# Patient Record
Sex: Male | Born: 1955 | Race: White | Hispanic: No | Marital: Married | Smoking: Former smoker
Health system: Southern US, Community
[De-identification: ages and names within clinical notes are randomized; demographics above are authoritative.]

## PROBLEM LIST (undated history)

## (undated) DIAGNOSIS — K5792 Diverticulitis of intestine, part unspecified, without perforation or abscess without bleeding: Secondary | ICD-10-CM

## (undated) DIAGNOSIS — T8182XA Emphysema (subcutaneous) resulting from a procedure, initial encounter: Secondary | ICD-10-CM

## (undated) DIAGNOSIS — K7460 Unspecified cirrhosis of liver: Secondary | ICD-10-CM

## (undated) DIAGNOSIS — A0472 Enterocolitis due to Clostridium difficile, not specified as recurrent: Secondary | ICD-10-CM

## (undated) DIAGNOSIS — C419 Malignant neoplasm of bone and articular cartilage, unspecified: Secondary | ICD-10-CM

## (undated) DIAGNOSIS — N10 Acute tubulo-interstitial nephritis: Secondary | ICD-10-CM

## (undated) DIAGNOSIS — R161 Splenomegaly, not elsewhere classified: Secondary | ICD-10-CM

## (undated) DIAGNOSIS — R310 Gross hematuria: Secondary | ICD-10-CM

## (undated) DIAGNOSIS — C679 Malignant neoplasm of bladder, unspecified: Secondary | ICD-10-CM

## (undated) DIAGNOSIS — C61 Malignant neoplasm of prostate: Secondary | ICD-10-CM

## (undated) HISTORY — DX: Malignant neoplasm of prostate: C61

## (undated) HISTORY — PX: BACK SURGERY: SHX140

## (undated) HISTORY — PX: ELBOW SURGERY: SHX618

---

## 2000-10-17 ENCOUNTER — Emergency Department (HOSPITAL_COMMUNITY): Admission: EM | Admit: 2000-10-17 | Discharge: 2000-10-17 | Payer: Self-pay | Admitting: Emergency Medicine

## 2000-10-17 ENCOUNTER — Encounter: Payer: Self-pay | Admitting: Emergency Medicine

## 2001-01-12 ENCOUNTER — Encounter: Payer: Self-pay | Admitting: Internal Medicine

## 2001-01-12 ENCOUNTER — Ambulatory Visit (HOSPITAL_COMMUNITY): Admission: RE | Admit: 2001-01-12 | Discharge: 2001-01-12 | Payer: Self-pay | Admitting: Internal Medicine

## 2001-02-24 ENCOUNTER — Ambulatory Visit (HOSPITAL_COMMUNITY): Admission: RE | Admit: 2001-02-24 | Discharge: 2001-02-24 | Payer: Self-pay | Admitting: Internal Medicine

## 2001-02-24 ENCOUNTER — Encounter: Payer: Self-pay | Admitting: Internal Medicine

## 2002-11-09 ENCOUNTER — Encounter: Payer: Self-pay | Admitting: Family Medicine

## 2002-11-09 ENCOUNTER — Ambulatory Visit (HOSPITAL_COMMUNITY): Admission: RE | Admit: 2002-11-09 | Discharge: 2002-11-09 | Payer: Self-pay | Admitting: Family Medicine

## 2003-03-14 ENCOUNTER — Emergency Department (HOSPITAL_COMMUNITY): Admission: EM | Admit: 2003-03-14 | Discharge: 2003-03-14 | Payer: Self-pay | Admitting: Emergency Medicine

## 2003-04-02 ENCOUNTER — Ambulatory Visit (HOSPITAL_COMMUNITY): Admission: RE | Admit: 2003-04-02 | Discharge: 2003-04-02 | Payer: Self-pay | Admitting: Orthopedic Surgery

## 2004-04-03 ENCOUNTER — Emergency Department: Payer: Self-pay | Admitting: Emergency Medicine

## 2005-01-30 ENCOUNTER — Emergency Department: Payer: Self-pay | Admitting: Emergency Medicine

## 2005-07-26 ENCOUNTER — Ambulatory Visit (HOSPITAL_COMMUNITY): Admission: RE | Admit: 2005-07-26 | Discharge: 2005-07-26 | Payer: Self-pay | Admitting: Internal Medicine

## 2005-08-10 ENCOUNTER — Inpatient Hospital Stay (HOSPITAL_COMMUNITY): Admission: EM | Admit: 2005-08-10 | Discharge: 2005-08-12 | Payer: Self-pay | Admitting: Emergency Medicine

## 2005-11-04 ENCOUNTER — Emergency Department (HOSPITAL_COMMUNITY): Admission: EM | Admit: 2005-11-04 | Discharge: 2005-11-04 | Payer: Self-pay | Admitting: Family Medicine

## 2005-11-28 ENCOUNTER — Other Ambulatory Visit: Payer: Self-pay

## 2005-11-28 ENCOUNTER — Emergency Department: Payer: Self-pay | Admitting: General Practice

## 2007-03-12 ENCOUNTER — Emergency Department (HOSPITAL_COMMUNITY): Admission: EM | Admit: 2007-03-12 | Discharge: 2007-03-12 | Payer: Self-pay | Admitting: Emergency Medicine

## 2007-03-13 ENCOUNTER — Encounter (HOSPITAL_COMMUNITY): Admission: RE | Admit: 2007-03-13 | Discharge: 2007-03-15 | Payer: Self-pay | Admitting: Emergency Medicine

## 2007-03-13 ENCOUNTER — Ambulatory Visit (HOSPITAL_COMMUNITY): Payer: Self-pay | Admitting: Emergency Medicine

## 2009-12-31 ENCOUNTER — Emergency Department (HOSPITAL_COMMUNITY): Admission: EM | Admit: 2009-12-31 | Discharge: 2009-12-31 | Payer: Self-pay | Admitting: Emergency Medicine

## 2010-05-27 LAB — CBC
HCT: 48.3 % (ref 39.0–52.0)
Hemoglobin: 16.3 g/dL (ref 13.0–17.0)
MCH: 28.6 pg (ref 26.0–34.0)
Platelets: 228 10*3/uL (ref 150–400)
RDW: 14.3 % (ref 11.5–15.5)
WBC: 10.2 10*3/uL (ref 4.0–10.5)

## 2010-05-27 LAB — URINE CULTURE

## 2010-05-27 LAB — DIFFERENTIAL
Basophils Relative: 1 % (ref 0–1)
Eosinophils Absolute: 0.2 10*3/uL (ref 0.0–0.7)
Lymphocytes Relative: 28 % (ref 12–46)
Monocytes Absolute: 0.8 10*3/uL (ref 0.1–1.0)
Monocytes Relative: 7 % (ref 3–12)
Neutro Abs: 6.4 10*3/uL (ref 1.7–7.7)

## 2010-05-27 LAB — URINALYSIS, ROUTINE W REFLEX MICROSCOPIC
Bilirubin Urine: NEGATIVE
Glucose, UA: NEGATIVE mg/dL
Ketones, ur: NEGATIVE mg/dL
Leukocytes, UA: NEGATIVE
Nitrite: NEGATIVE
Specific Gravity, Urine: >1.03 — ABNORMAL HIGH (ref 1.005–1.030)
pH: 5.5 (ref 5.0–8.0)

## 2010-05-27 LAB — BASIC METABOLIC PANEL
BUN: 21 mg/dL (ref 6–23)
Calcium: 8.9 mg/dL (ref 8.4–10.5)
Chloride: 105 mEq/L (ref 96–112)
GFR calc Af Amer: >60 mL/min (ref >60)
Glucose, Bld: 106 mg/dL — ABNORMAL HIGH (ref 70–99)

## 2010-05-27 LAB — URINE MICROSCOPIC-ADD ON

## 2010-07-31 NOTE — H&P (Signed)
NAME:  [name], [name] NO.:  [id number]   MEDICAL RECORD NO.:  [id number]          PATIENT TYPE:  INP   LOCATION:  A306                          FACILITY:  [facility]   PHYSICIAN:  Kirk Ruths, M.D.DATE OF BIRTH:  [birth year]   DATE OF ADMISSION:  08/10/2005  DATE OF DISCHARGE:  LH                                HISTORY & PHYSICAL   CHIEF COMPLAINT:  Abdominal pain.   HISTORY OF PRESENT ILLNESS:  This is a 55 year old male who presents to the  emergency room with a two-day history of fever and left lower quadrant pain.  The patient workup in the ER showed a white count of 14,000 and CT scan  consistent with acute diverticulitis of the descending colon without  abscess. The patient is admitted for IV antibiotics, control of this  infection. He denies nausea, vomiting, diarrhea, constipation and he does  state he has had some diaphoreses.   PAST MEDICAL HISTORY:  He is allergic to no medications and he currently  takes only occasional arthritis medication for aches and pain.   SOCIAL HISTORY:  He works in [employer], nonsmoker, nondrinker.   PHYSICAL EXAMINATION:  VITAL SIGNS:  Temperature 98, blood pressure 120/70,  heart rate 80, respirations 20, O2 sats 96% on room air.  HEENT:  TMs normal. Pupils equal round and reactive to light and  accommodation. Oropharynx benign.  NECK:  Supple without JVD, bruit or thyromegaly.  LUNGS:  Clear in all areas.  HEART:  Regular sinus rhythm without murmur, gallop or rub.  ABDOMEN:  Positive bowel sounds with mild to moderate left lower quadrant  tenderness, no rebound.  EXTREMITIES:  Without cyanosis, clubbing or edema.  NEUROLOGIC:  Grossly intact.   ASSESSMENT:  Acute diverticulitis.   PLAN:  Cipro and Flagyl. Observe closely.      Kirk Ruths, M.D.  Electronically Signed     WMM/MEDQ  D:  08/11/2005  T:  08/11/2005  Job:  [id number]

## 2010-07-31 NOTE — Discharge Summary (Signed)
NAME:  [name], [name] NO.:  [id number]   MEDICAL RECORD NO.:  [id number]          PATIENT TYPE:  INP   LOCATION:  A306                          FACILITY:  [facility]   PHYSICIAN:  Kirk Ruths, M.D.DATE OF BIRTH:  1955-11-16   DATE OF ADMISSION:  08/10/2005  DATE OF DISCHARGE:  05/31/2007LH                                 DISCHARGE SUMMARY   DISCHARGE DIAGNOSIS:  Acute diverticulitis.   HOSPITAL COURSE:  This 55 year old male presented to the emergency room with  a 2-day history of fever and pain in his left lower quadrant.  CT scan  showed acute diverticulitis of the descending colon without abscess.  Initial white count was 14,000 on admission.  The patient was admitted for  IV antibiotics for control of his diverticulitis.  He remained afebrile  throughout his stay.  Blood pressure was well controlled.  His white count  the second hospital day was 13.6, hemoglobin was 16.  The patient's  electrolytes were within normal range throughout his stay, as well as his  liver function tests.  Lipase was normal at 18.  Urinalysis was  unremarkable.  The patient's pain is diminished, requiring no pain  medications at the time of discharge, no nausea or vomiting.  He will be  discharged home on Cipro 500 twice a day for a week; and Flagyl 500, 3 times  a day for a week, having received these two medications through the IV  during his stay.  He should be able to return to work on Monday August 16, 2005.  He will be followed in the office in 2 weeks as needed.      Kirk Ruths, M.D.  Electronically Signed     WMM/MEDQ  D:  08/12/2005  T:  08/12/2005  Job:  [id number]

## 2010-12-18 LAB — CULTURE, BODY FLUID W GRAM STAIN -BOTTLE: Report Status: 1022009

## 2011-07-02 ENCOUNTER — Emergency Department (HOSPITAL_COMMUNITY): Payer: Medicaid Other

## 2011-07-02 ENCOUNTER — Encounter (HOSPITAL_COMMUNITY): Payer: Self-pay | Admitting: Emergency Medicine

## 2011-07-02 ENCOUNTER — Inpatient Hospital Stay (HOSPITAL_COMMUNITY)
Admission: EM | Admit: 2011-07-02 | Discharge: 2011-07-04 | DRG: 373 | Disposition: A | Discharge status: Home / Self Care | Payer: Medicaid Other | Attending: Internal Medicine | Admitting: Internal Medicine

## 2011-07-02 DIAGNOSIS — M25519 Pain in unspecified shoulder: Secondary | ICD-10-CM | POA: Diagnosis present

## 2011-07-02 DIAGNOSIS — E860 Dehydration: Secondary | ICD-10-CM | POA: Diagnosis present

## 2011-07-02 DIAGNOSIS — E669 Obesity, unspecified: Secondary | ICD-10-CM | POA: Diagnosis present

## 2011-07-02 DIAGNOSIS — K529 Noninfective gastroenteritis and colitis, unspecified: Secondary | ICD-10-CM | POA: Diagnosis present

## 2011-07-02 DIAGNOSIS — R310 Gross hematuria: Secondary | ICD-10-CM | POA: Diagnosis present

## 2011-07-02 DIAGNOSIS — H9190 Unspecified hearing loss, unspecified ear: Secondary | ICD-10-CM | POA: Diagnosis present

## 2011-07-02 DIAGNOSIS — D72829 Elevated white blood cell count, unspecified: Secondary | ICD-10-CM | POA: Diagnosis present

## 2011-07-02 DIAGNOSIS — A0472 Enterocolitis due to Clostridium difficile, not specified as recurrent: Principal | ICD-10-CM | POA: Diagnosis present

## 2011-07-02 DIAGNOSIS — Z6830 Body mass index (BMI) 30.0-30.9, adult: Secondary | ICD-10-CM

## 2011-07-02 DIAGNOSIS — R319 Hematuria, unspecified: Secondary | ICD-10-CM | POA: Diagnosis present

## 2011-07-02 DIAGNOSIS — M21939 Unspecified acquired deformity of unspecified forearm: Secondary | ICD-10-CM | POA: Diagnosis present

## 2011-07-02 DIAGNOSIS — Z87891 Personal history of nicotine dependence: Secondary | ICD-10-CM

## 2011-07-02 HISTORY — DX: Gross hematuria: R31.0

## 2011-07-02 HISTORY — DX: Enterocolitis due to Clostridium difficile, not specified as recurrent: A04.72

## 2011-07-02 LAB — URINALYSIS, ROUTINE W REFLEX MICROSCOPIC
Glucose, UA: NEGATIVE mg/dL
Ketones, ur: NEGATIVE mg/dL
Leukocytes, UA: NEGATIVE
Nitrite: NEGATIVE
Urobilinogen, UA: 0.2 mg/dL (ref 0.0–1.0)

## 2011-07-02 LAB — PROTIME-INR: INR: 1.06 (ref 0.00–1.49)

## 2011-07-02 LAB — DIFFERENTIAL
Basophils Relative: 0 % (ref 0–1)
Lymphocytes Relative: 14 % (ref 12–46)
Neutro Abs: 11.9 10*3/uL — ABNORMAL HIGH (ref 1.7–7.7)
Neutrophils Relative %: 78 % — ABNORMAL HIGH (ref 43–77)

## 2011-07-02 LAB — URINE MICROSCOPIC-ADD ON

## 2011-07-02 LAB — COMPREHENSIVE METABOLIC PANEL
ALT: 79 U/L — ABNORMAL HIGH (ref 0–53)
BUN: 16 mg/dL (ref 6–23)
CO2: 26 mEq/L (ref 19–32)
Calcium: 9.6 mg/dL (ref 8.4–10.5)
Creatinine, Ser: 1.12 mg/dL (ref 0.50–1.35)
GFR calc Af Amer: 83 mL/min — ABNORMAL LOW (ref >90)
GFR calc non Af Amer: 72 mL/min — ABNORMAL LOW (ref >90)
Total Bilirubin: 1 mg/dL (ref 0.3–1.2)

## 2011-07-02 LAB — APTT: aPTT: 31 seconds (ref 24–37)

## 2011-07-02 LAB — CBC
RBC: 6.5 MIL/uL — ABNORMAL HIGH (ref 4.22–5.81)
RDW: 14.6 % (ref 11.5–15.5)

## 2011-07-02 LAB — LIPASE, BLOOD: Lipase: 19 U/L (ref 11–59)

## 2011-07-02 MED ORDER — SODIUM CHLORIDE 0.9 % IV SOLN
INTRAVENOUS | Status: DC
  Administered 2011-07-03: 08:00:00 via INTRAVENOUS

## 2011-07-02 MED ORDER — ACETAMINOPHEN 325 MG PO TABS: 650.0000 mg | ORAL_TABLET | Freq: Four times a day (QID) | ORAL | Status: DC | PRN

## 2011-07-02 MED ORDER — SODIUM CHLORIDE 0.9 % IV BOLUS (SEPSIS)
1000.0000 mL | Freq: Once | INTRAVENOUS | Status: AC
  Administered 2011-07-02: 1000 mL via INTRAVENOUS

## 2011-07-02 MED ORDER — ONDANSETRON HCL 4 MG/2ML IJ SOLN: 4.0000 mg | INTRAMUSCULAR | Status: DC | PRN

## 2011-07-02 MED ORDER — PANTOPRAZOLE SODIUM 40 MG PO TBEC
40.0000 mg | DELAYED_RELEASE_TABLET | Freq: Every day | ORAL | Status: DC
  Administered 2011-07-02 – 2011-07-04 (×4): 40 mg via ORAL
  Filled 2011-07-02 (×3): qty 1

## 2011-07-02 MED ORDER — HYDROMORPHONE HCL PF 1 MG/ML IJ SOLN
1.0000 mg | Freq: Once | INTRAMUSCULAR | Status: AC
  Administered 2011-07-02: 1 mg via INTRAVENOUS
  Filled 2011-07-02: qty 1

## 2011-07-02 MED ORDER — CIPROFLOXACIN IN D5W 400 MG/200ML IV SOLN
INTRAVENOUS | Status: AC
  Filled 2011-07-02: qty 200

## 2011-07-02 MED ORDER — TRAZODONE HCL 50 MG PO TABS: 25.0000 mg | ORAL_TABLET | Freq: Every evening | ORAL | Status: DC | PRN

## 2011-07-02 MED ORDER — CIPROFLOXACIN IN D5W 400 MG/200ML IV SOLN
400.0000 mg | Freq: Two times a day (BID) | INTRAVENOUS | Status: DC
  Administered 2011-07-02 – 2011-07-03 (×4): 400 mg via INTRAVENOUS
  Filled 2011-07-02 (×5): qty 200

## 2011-07-02 MED ORDER — ONDANSETRON HCL 4 MG/2ML IJ SOLN
4.0000 mg | Freq: Once | INTRAMUSCULAR | Status: AC
  Administered 2011-07-02: 4 mg via INTRAVENOUS
  Filled 2011-07-02: qty 2

## 2011-07-02 MED ORDER — ONDANSETRON HCL 4 MG PO TABS: 4.0000 mg | ORAL_TABLET | Freq: Four times a day (QID) | ORAL | Status: DC | PRN

## 2011-07-02 MED ORDER — IOHEXOL 300 MG/ML  SOLN
40.0000 mL | Freq: Once | INTRAMUSCULAR | Status: AC | PRN
  Administered 2011-07-02: 40 mL via ORAL

## 2011-07-02 MED ORDER — METRONIDAZOLE 500 MG PO TABS
500.0000 mg | ORAL_TABLET | Freq: Three times a day (TID) | ORAL | Status: DC
  Administered 2011-07-02 – 2011-07-04 (×6): 500 mg via ORAL
  Filled 2011-07-02 (×7): qty 1

## 2011-07-02 MED ORDER — IOHEXOL 300 MG/ML  SOLN
100.0000 mL | Freq: Once | INTRAMUSCULAR | Status: AC | PRN
  Administered 2011-07-02: 100 mL via INTRAVENOUS

## 2011-07-02 MED ORDER — ACETAMINOPHEN 650 MG RE SUPP: 650.0000 mg | Freq: Four times a day (QID) | RECTAL | Status: DC | PRN

## 2011-07-02 MED ORDER — HYDROMORPHONE HCL PF 1 MG/ML IJ SOLN
1.0000 mg | INTRAMUSCULAR | Status: DC | PRN
  Administered 2011-07-02 – 2011-07-03 (×5): 1 mg via INTRAVENOUS
  Filled 2011-07-02 (×6): qty 1

## 2011-07-02 MED ORDER — POTASSIUM CHLORIDE IN NACL 20-0.9 MEQ/L-% IV SOLN
INTRAVENOUS | Status: DC
  Administered 2011-07-02 – 2011-07-03 (×2): via INTRAVENOUS

## 2011-07-02 NOTE — H&P (Signed)
18 stools watery brown since abd pain started yesterday. No blood hematuria x 3 months PCP:   [facility]  Chief Complaint:  Abdominal pain and diarrhea since yesterday  HPI: [name] is an 56 y.o. male.  Denies chronic medical problems; takes no chronic medication; had sudden onset of colicky generalized abdominal pain since midday yesterday; associated with diffuse watery brown diarrhea; about 18 stools since midday yesterday; no blood or melena. Some nausea but no vomiting. No family history of gastrointestinal problem.  Has been having episodic gross hematuria for the past 3 months; associated with episodic dysuria ;treated with a course of sulfa antibiotics without significant improvement.  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  Quit smoking 4 years ago, but smoked for 35 years before that, half to one pack per day. Denies alcohol use.  Extremely hard of hearing  Complains of thirst Rewiew of Systems:  The patient denies , fever, weight loss,, vision loss,, hoarseness, chest pain, syncope, dyspnea on exertion, peripheral edema, balance deficits, hemoptysis, melena, hematochezia, severe indigestion/heartburn,  incontinence, genital sores, muscle weakness, suspicious skin lesions, transient blindness, difficulty walking, depression, unusual weight change, enlarged lymph nodes, angioedema, and breast masses.   Past Medical History  Diagnosis Date  . Gross hematuria     Past Surgical History  Procedure Date  . Elbow surgery     Medications:  HOME MEDS: Prior to Admission medications   Medication Sig Start Date End Date Taking? Authorizing Provider  bismuth subsalicylate (PEPTO BISMOL) 262 MG/15ML suspension Take 30 mLs by mouth 2 (two) times daily as needed. For upset stomach   Yes Historical Provider, MD     Allergies:  No  Known Allergies  Social History:   reports that he quit smoking about 3 years ago. He does not have any smokeless tobacco history on file. He reports that he does not drink alcohol or use illicit drugs.  Family History: Family History  Problem Relation Age of Onset  . Heart failure Father   . Hypertension Mother      Physical Exam: Filed Vitals:   07/02/11 1822 07/02/11 1825  BP:  142/86  Pulse:  74  Temp:  97.4 F (36.3 C)  TempSrc:  Oral  Resp:  20  Height: 6\' 2"  (1.88 m)   Weight: 105.688 kg (233 lb)   SpO2:  95%   Blood pressure 142/86, pulse 74, temperature 97.4 F (36.3 C), temperature source Oral, resp. rate 20, height 6\' 2"  (1.88 m), weight 105.688 kg (233 lb), SpO2 95.00%.  GEN:  Pleasant mildly obese Caucasian gentleman lying in the stretcher; looks uncomfortable; cooperative with exam PSYCH:  alert and oriented x4;  affect is appropriate. HEENT: Mucous membranes pink, dry and anicteric; PERRLA; EOM intact; no cervical lymphadenopathy nor thyromegaly or carotid bruit; no JVD; Breasts:: Not examined CHEST WALL: No tenderness CHEST: Normal respiration, occasional rhonchi HEART: Regular rate and rhythm; no murmurs rubs or gallops BACK:; no CVA tenderness ABDOMEN: Obese, soft non-tender; no masses, no organomegaly, normal abdominal bowel sounds; no pannus; no intertriginous candida. Rectal Exam: Not done EXTREMITIES: Flexion deformity of left elbow; with irregular structural deformity at the elbow;; other age-appropriate arthropathy of the hands and knees; no edema; no ulcerations. Genitalia: not examined PULSES: 2+ and symmetric SKIN: Normal hydration no rash or ulceration; multiple lipomas or dislocations of the body CNS: Cranial nerves 2-12 grossly intact no focal  lateralizing neurologic deficit   Labs & Imaging Results for orders placed during the hospital encounter of 07/02/11 (from the past 48 hour(s))  CBC     Status: Abnormal   Collection Time   07/02/11   7:25 PM      Component Value Range Comment   WBC 15.1 (*) 4.0 - 10.5 (K/uL)    RBC 6.50 (*) 4.22 - 5.81 (MIL/uL)    Hemoglobin 18.8 (*) 13.0 - 17.0 (g/dL)    HCT 16.1 (*) 09.6 - 52.0 (%)    MCV 82.3  78.0 - 100.0 (fL)    MCH 28.9  26.0 - 34.0 (pg)    MCHC 35.1  30.0 - 36.0 (g/dL)    RDW 04.5  40.9 - 81.1 (%)    Platelets 255  150 - 400 (K/uL)   DIFFERENTIAL     Status: Abnormal   Collection Time   07/02/11  7:25 PM      Component Value Range Comment   Neutrophils Relative 78 (*) 43 - 77 (%)    Neutro Abs 11.9 (*) 1.7 - 7.7 (K/uL)    Lymphocytes Relative 14  12 - 46 (%)    Lymphs Abs 2.0  0.7 - 4.0 (K/uL)    Monocytes Relative 6  3 - 12 (%)    Monocytes Absolute 1.0  0.1 - 1.0 (K/uL)    Eosinophils Relative 2  0 - 5 (%)    Eosinophils Absolute 0.2  0.0 - 0.7 (K/uL)    Basophils Relative 0  0 - 1 (%)    Basophils Absolute 0.0  0.0 - 0.1 (K/uL)   COMPREHENSIVE METABOLIC PANEL     Status: Abnormal   Collection Time   07/02/11  7:25 PM      Component Value Range Comment   Sodium 138  135 - 145 (mEq/L)    Potassium 3.5  3.5 - 5.1 (mEq/L)    Chloride 101  96 - 112 (mEq/L)    CO2 26  19 - 32 (mEq/L)    Glucose, Bld 116 (*) 70 - 99 (mg/dL)    BUN 16  6 - 23 (mg/dL)    Creatinine, Ser 9.14  0.50 - 1.35 (mg/dL)    Calcium 9.6  8.4 - 10.5 (mg/dL)    Total Protein 7.9  6.0 - 8.3 (g/dL)    Albumin 4.1  3.5 - 5.2 (g/dL)    AST 88 (*) 0 - 37 (U/L)    ALT 79 (*) 0 - 53 (U/L)    Alkaline Phosphatase 77  39 - 117 (U/L)    Total Bilirubin 1.0  0.3 - 1.2 (mg/dL)    GFR calc non Af Amer 72 (*) >90 (mL/min)    GFR calc Af Amer 83 (*) >90 (mL/min)   LIPASE, BLOOD     Status: Normal   Collection Time   07/02/11  7:25 PM      Component Value Range Comment   Lipase 19  11 - 59 (U/L)   URINALYSIS, ROUTINE W REFLEX MICROSCOPIC     Status: Abnormal   Collection Time   07/02/11  9:20 PM      Component Value Range Comment   Color, Urine AMBER (*) YELLOW  BIOCHEMICALS MAY BE AFFECTED BY COLOR    APPearance CLOUDY (*) CLEAR     Specific Gravity, Urine <1.005 (*) 1.005 - 1.030     pH 5.5  5.0 - 8.0     Glucose, UA NEGATIVE  NEGATIVE (mg/dL)  Hgb urine dipstick LARGE (*) NEGATIVE     Bilirubin Urine NEGATIVE  NEGATIVE     Ketones, ur NEGATIVE  NEGATIVE (mg/dL)    Protein, ur 30 (*) NEGATIVE (mg/dL)    Urobilinogen, UA 0.2  0.0 - 1.0 (mg/dL)    Nitrite NEGATIVE  NEGATIVE     Leukocytes, UA NEGATIVE  NEGATIVE    URINE MICROSCOPIC-ADD ON     Status: Abnormal   Collection Time   07/02/11  9:20 PM      Component Value Range Comment   Squamous Epithelial / LPF RARE  RARE     WBC, UA 21-50  <3 (WBC/hpf)    RBC / HPF 21-50  <3 (RBC/hpf)    Bacteria, UA FEW (*) RARE     Ct Abdomen Pelvis W Contrast  07/02/2011  *RADIOLOGY REPORT*  Clinical Data: Right lower quadrant pain, nausea, diarrhea. History of diverticulitis.  CT ABDOMEN AND PELVIS WITH CONTRAST  Technique:  Multidetector CT imaging of the abdomen and pelvis was performed following the standard protocol during bolus administration of intravenous contrast.  Contrast: OMNIPAQUE IOHEXOL 300 MG/ML  SOLN  Comparison: 08/10/2005  Findings: Mild linear scarring versus atelectasis in the lingula and right lower lobe.  Mildly nodular hepatic contour, correlate for cirrhosis.  Possible hepatic steatosis.  Spleen, pancreas, and adrenal glands within normal limits.  Gallbladder is unremarkable.  No intrahepatic or extrahepatic ductal dilatation.  No evidence of bowel obstruction.  Multiple abnormal loops of distal/terminal ileum in the right lower abdomen with mucosal thickening and surrounding mesenteric stranding/edema, likely reflecting infectious/inflammatory enteritis.  Normal appendix. Scattered mild colonic diverticulosis, without associated inflammatory changes.  No evidence of abdominal aortic aneurysm.  No suspicious abdominopelvic lymphadenopathy.  Small volume abdominopelvic ascites.  Central prostate gland indents the base of the  bladder.  High density material in the bladder, at least some of which likely reflects hemorrhage.  Underlying mass is not excluded.  Cystoscopic correlation is advised.  Moderate fat and fluid containing periumbilical hernia (series 2/image 58).  Degenerative changes of the visualized thoracolumbar spine.  IMPRESSION: Multiple abnormal loops of distal/terminal ileum in the right lower abdomen, as described above, likely reflecting infectious/inflammatory enteritis, ischemia considered unlikely.  Normal appendix.  Suspected bladder hemorrhage, underlying mass not excluded. Cystoscopic correlation is advised.  Mildly nodular hepatic contour, correlate for cirrhosis.  Possible hepatic steatosis.  These results were called by telephone on 07/02/2011  at  2105 hours to  Dr. Donnetta Hutching, who verbally acknowledged these results.  Original Report Authenticated By: Charline Bills, M.D.      Assessment Present on Admission:  .Enteritis, infectious versus ischemic versus other inflammatory  .Hematuria possible prostatic disorder, possible bladder tumor  PLAN: Admit this gentleman for hydration and empiric treatment of enteritis; Will check C. difficile PCR, give oral Flagyl and IV Cipro  Will culture urine and modify antibiotics depending on the results of cultures Will need urology followup as an outpatient  Other plans as per orders.   Primrose Oler 07/02/2011, 10:28 PM

## 2011-07-02 NOTE — ED Provider Notes (Addendum)
History     CSN: [id number]  Arrival date & time 07/02/11  4098   First MD Initiated Contact with Patient 07/02/11 1836      Chief Complaint  Patient presents with  . Abdominal Pain    (Consider location/radiation/quality/duration/timing/severity/associated sxs/prior treatment) HPI...generalized abdominal pain for 24 hours with nausea. No Previous abdominal surgery. Decreased appetite. Pain is described as aching and sharp. Patient does not drink alcohol. No smoking. He is supposed to followup at [facility] for intermittent hematuria.  Level V caveat for urgent need for intervention  History reviewed. No pertinent past medical history.  Past Surgical History  Procedure Date  . Elbow surgery     Family History  Problem Relation Age of Onset  . Heart failure Father     History  Substance Use Topics  . Smoking status: Former Games developer  . Smokeless tobacco: Not on file  . Alcohol Use: No      Review of Systems  Unable to perform ROS All other systems reviewed and are negative.    Allergies  Review of patient's allergies indicates no known allergies.  Home Medications   Current Outpatient Rx  Name Route Sig Dispense Refill  . BISMUTH SUBSALICYLATE 262 MG/15ML PO SUSP Oral Take 30 mLs by mouth 2 (two) times daily as needed. For upset stomach      BP 142/86  Pulse 74  Temp(Src) 97.4 F (36.3 C) (Oral)  Resp 20  Ht 6\' 2"  (1.88 m)  Wt 233 lb (105.688 kg)  BMI 29.92 kg/m2  SpO2 95%  Physical Exam  Nursing note and vitals reviewed. Constitutional: He is oriented to person, place, and time. He appears well-developed and well-nourished.  HENT:  Head: Normocephalic and atraumatic.  Eyes: Conjunctivae and EOM are normal. Pupils are equal, round, and reactive to light.  Neck: Normal range of motion. Neck supple.  Cardiovascular: Normal rate and regular rhythm.   Pulmonary/Chest: Effort normal and breath sounds normal.  Abdominal: Soft. Bowel sounds are  normal.       Minimal diffuse tenderness; seems to be worse in right lower quadrant  Musculoskeletal: Normal range of motion.  Neurological: He is alert and oriented to person, place, and time.  Skin: Skin is warm and dry.  Psychiatric: He has a normal mood and affect.    ED Course  Procedures (including critical care time)  Labs Reviewed  CBC - Abnormal; Notable for the following:    WBC 15.1 (*)    RBC 6.50 (*)    Hemoglobin 18.8 (*)    HCT 53.5 (*)    All other components within normal limits  DIFFERENTIAL - Abnormal; Notable for the following:    Neutrophils Relative 78 (*)    Neutro Abs 11.9 (*)    All other components within normal limits  COMPREHENSIVE METABOLIC PANEL - Abnormal; Notable for the following:    Glucose, Bld 116 (*)    AST 88 (*)    ALT 79 (*)    GFR calc non Af Amer 72 (*)    GFR calc Af Amer 83 (*)    All other components within normal limits  LIPASE, BLOOD  URINALYSIS, ROUTINE W REFLEX MICROSCOPIC   No results found.   No diagnosis found. Ct Abdomen Pelvis W Contrast  07/02/2011  *RADIOLOGY REPORT*  Clinical Data: Right lower quadrant pain, nausea, diarrhea. History of diverticulitis.  CT ABDOMEN AND PELVIS WITH CONTRAST  Technique:  Multidetector CT imaging of the abdomen and pelvis was performed  following the standard protocol during bolus administration of intravenous contrast.  Contrast: OMNIPAQUE IOHEXOL 300 MG/ML  SOLN  Comparison: 08/10/2005  Findings: Mild linear scarring versus atelectasis in the lingula and right lower lobe.  Mildly nodular hepatic contour, correlate for cirrhosis.  Possible hepatic steatosis.  Spleen, pancreas, and adrenal glands within normal limits.  Gallbladder is unremarkable.  No intrahepatic or extrahepatic ductal dilatation.  No evidence of bowel obstruction.  Multiple abnormal loops of distal/terminal ileum in the right lower abdomen with mucosal thickening and surrounding mesenteric stranding/edema, likely  reflecting infectious/inflammatory enteritis.  Normal appendix. Scattered mild colonic diverticulosis, without associated inflammatory changes.  No evidence of abdominal aortic aneurysm.  No suspicious abdominopelvic lymphadenopathy.  Small volume abdominopelvic ascites.  Central prostate gland indents the base of the bladder.  High density material in the bladder, at least some of which likely reflects hemorrhage.  Underlying mass is not excluded.  Cystoscopic correlation is advised.  Moderate fat and fluid containing periumbilical hernia (series 2/image 58).  Degenerative changes of the visualized thoracolumbar spine.  IMPRESSION: Multiple abnormal loops of distal/terminal ileum in the right lower abdomen, as described above, likely reflecting infectious/inflammatory enteritis, ischemia considered unlikely.  Normal appendix.  Suspected bladder hemorrhage, underlying mass not excluded. Cystoscopic correlation is advised.  Mildly nodular hepatic contour, correlate for cirrhosis.  Possible hepatic steatosis.  These results were called by telephone on 07/02/2011  at  2105 hours to  Dr. Donnetta Hutching, who verbally acknowledged these results.  Original Report Authenticated By: Charline Bills, M.D.     MDM  Will obtain CT scan to rule out appendicitis.  2130: Recheck.  Acute abdomen. CT scan discussed with patient and wife. Urinalysis still pending.  Will admit     Donnetta Hutching, MD 07/02/11 1610  Donnetta Hutching, MD 07/07/11 [phone]

## 2011-07-02 NOTE — ED Notes (Signed)
Pt placed on brown precautions until C. Diff completed.

## 2011-07-02 NOTE — ED Notes (Signed)
Pt c/o abdominal pain, diarrhea and nausea. Pt denies vomiting. Pt reports sweating with pain and then chills.

## 2011-07-03 ENCOUNTER — Encounter (HOSPITAL_COMMUNITY): Payer: Self-pay | Admitting: *Deleted

## 2011-07-03 DIAGNOSIS — E860 Dehydration: Secondary | ICD-10-CM | POA: Diagnosis present

## 2011-07-03 DIAGNOSIS — D72829 Elevated white blood cell count, unspecified: Secondary | ICD-10-CM | POA: Diagnosis present

## 2011-07-03 LAB — BASIC METABOLIC PANEL
BUN: 15 mg/dL (ref 6–23)
Calcium: 8.6 mg/dL (ref 8.4–10.5)
GFR calc Af Amer: 85 mL/min — ABNORMAL LOW (ref >90)
GFR calc non Af Amer: 73 mL/min — ABNORMAL LOW (ref >90)
Glucose, Bld: 118 mg/dL — ABNORMAL HIGH (ref 70–99)
Potassium: 4.3 mEq/L (ref 3.5–5.1)

## 2011-07-03 LAB — HEMOGLOBIN A1C: Hgb A1c MFr Bld: 5.4 % (ref <5.7)

## 2011-07-03 LAB — CBC
Hemoglobin: 15.8 g/dL (ref 13.0–17.0)
MCH: 28.2 pg (ref 26.0–34.0)
MCHC: 33.8 g/dL (ref 30.0–36.0)
Platelets: 240 10*3/uL (ref 150–400)
RDW: 14.7 % (ref 11.5–15.5)

## 2011-07-03 LAB — TSH: TSH: 1.332 u[IU]/mL (ref 0.350–4.500)

## 2011-07-03 MED ORDER — DIPHENHYDRAMINE HCL 25 MG PO CAPS
25.0000 mg | ORAL_CAPSULE | Freq: Four times a day (QID) | ORAL | Status: DC | PRN
  Administered 2011-07-03 (×2): 25 mg via ORAL
  Filled 2011-07-03: qty 1

## 2011-07-03 MED ORDER — SODIUM CHLORIDE 0.9 % IV SOLN
INTRAVENOUS | Status: DC
  Administered 2011-07-03 – 2011-07-04 (×2): via INTRAVENOUS

## 2011-07-03 MED ORDER — OXYCODONE-ACETAMINOPHEN 5-325 MG PO TABS
1.0000 | ORAL_TABLET | ORAL | Status: DC | PRN
  Administered 2011-07-03 (×2): 2 via ORAL
  Filled 2011-07-03 (×2): qty 2

## 2011-07-03 MED ORDER — DIPHENHYDRAMINE HCL 50 MG/ML IJ SOLN: 25.0000 mg | Freq: Four times a day (QID) | INTRAMUSCULAR | Status: DC | PRN

## 2011-07-03 MED ORDER — SODIUM CHLORIDE 0.9 % IJ SOLN
INTRAMUSCULAR | Status: AC
  Administered 2011-07-03: 17:00:00
  Filled 2011-07-03: qty 3

## 2011-07-03 MED ORDER — DIPHENHYDRAMINE HCL 25 MG PO CAPS
ORAL_CAPSULE | ORAL | Status: AC
  Administered 2011-07-03: 18:00:00
  Filled 2011-07-03: qty 1

## 2011-07-03 NOTE — Progress Notes (Signed)
Subjective: Still having abd pain and diarrhea, no vomiting  Objective: Vital signs in last 24 hours: Temp:  [97.4 F (36.3 C)-98.2 F (36.8 C)] 97.7 F (36.5 C) (04/20 0453) Pulse Rate:  [64-77] 64  (04/20 0453) Resp:  [18-20] 18  (04/20 0453) BP: (95-142)/(56-86) 95/56 mmHg (04/20 0453) SpO2:  [91 %-97 %] 97 % (04/20 0453) Weight:  [105.688 kg (233 lb)-106 kg (233 lb 11 oz)] 106 kg (233 lb 11 oz) (04/20 0655) Weight change:  Last BM Date: 07/02/11  Intake/Output from previous day: 04/19 0701 - 04/20 0700 In: 1367 [P.O.:220; I.V.:1147] Out: 325 [Urine:325]     Physical Exam: General: Alert, awake, oriented x3, in no acute distress. HEENT: No bruits, no goiter. Heart: Regular rate and rhythm, without murmurs, rubs, gallops. Lungs: Clear to auscultation bilaterally. Abdomen: Soft, tender in periumbilical and suprapubic area, nondistended, positive bowel sounds. Extremities: No clubbing cyanosis or edema with positive pedal pulses. Neuro: Grossly intact, nonfocal.    Lab Results: Basic Metabolic Panel:  Basename 07/03/11 0655 07/02/11 1925  NA 138 138  K 4.3 3.5  CL 104 101  CO2 27 26  GLUCOSE 118* 116*  BUN 15 16  CREATININE 1.10 1.12  CALCIUM 8.6 9.6  MG -- 2.1  PHOS -- --   Liver Function Tests:  [facility] 07/02/11 1925  AST 88*  ALT 79*  ALKPHOS 77  BILITOT 1.0  PROT 7.9  ALBUMIN 4.1    Basename 07/02/11 1925  LIPASE 19  AMYLASE --   No results found for this basename: AMMONIA:2 in the last 72 hours CBC:  Basename 07/03/11 0655 07/02/11 1925  WBC 11.9* 15.1*  NEUTROABS -- 11.9*  HGB 15.8 18.8*  HCT 46.7 53.5*  MCV 83.2 82.3  PLT 240 255   Cardiac Enzymes: No results found for this basename: CKTOTAL:3,CKMB:3,CKMBINDEX:3,TROPONINI:3 in the last 72 hours BNP: No results found for this basename: PROBNP:3 in the last 72 hours D-Dimer: No results found for this basename: DDIMER:2 in the last 72 hours CBG: No results found for this  basename: GLUCAP:6 in the last 72 hours Hemoglobin A1C: No results found for this basename: HGBA1C in the last 72 hours Fasting Lipid Panel: No results found for this basename: CHOL,HDL,LDLCALC,TRIG,CHOLHDL,LDLDIRECT in the last 72 hours Thyroid Function Tests: No results found for this basename: TSH,T4TOTAL,FREET4,T3FREE,THYROIDAB in the last 72 hours Anemia Panel: No results found for this basename: VITAMINB12,FOLATE,FERRITIN,TIBC,IRON,RETICCTPCT in the last 72 hours Coagulation:  Basename 07/02/11 1925  LABPROT 14.0  INR 1.06   Urine Drug Screen: Drugs of Abuse  No results found for this basename: labopia, cocainscrnur, labbenz, amphetmu, thcu, labbarb    Alcohol Level: No results found for this basename: ETH:2 in the last 72 hours Urinalysis:  Basename 07/02/11 2120  COLORURINE AMBER*  LABSPEC <1.005*  PHURINE 5.5  GLUCOSEU NEGATIVE  HGBUR LARGE*  BILIRUBINUR NEGATIVE  KETONESUR NEGATIVE  PROTEINUR 30*  UROBILINOGEN 0.2  NITRITE NEGATIVE  LEUKOCYTESUR NEGATIVE    No results found for this or any previous visit (from the past 240 hour(s)).  Studies/Results: Ct Abdomen Pelvis W Contrast  07/02/2011  *RADIOLOGY REPORT*  Clinical Data: Right lower quadrant pain, nausea, diarrhea. History of diverticulitis.  CT ABDOMEN AND PELVIS WITH CONTRAST  Technique:  Multidetector CT imaging of the abdomen and pelvis was performed following the standard protocol during bolus administration of intravenous contrast.  Contrast: OMNIPAQUE IOHEXOL 300 MG/ML  SOLN  Comparison: 08/10/2005  Findings: Mild linear scarring versus atelectasis in the lingula and right lower  lobe.  Mildly nodular hepatic contour, correlate for cirrhosis.  Possible hepatic steatosis.  Spleen, pancreas, and adrenal glands within normal limits.  Gallbladder is unremarkable.  No intrahepatic or extrahepatic ductal dilatation.  No evidence of bowel obstruction.  Multiple abnormal loops of distal/terminal ileum in  the right lower abdomen with mucosal thickening and surrounding mesenteric stranding/edema, likely reflecting infectious/inflammatory enteritis.  Normal appendix. Scattered mild colonic diverticulosis, without associated inflammatory changes.  No evidence of abdominal aortic aneurysm.  No suspicious abdominopelvic lymphadenopathy.  Small volume abdominopelvic ascites.  Central prostate gland indents the base of the bladder.  High density material in the bladder, at least some of which likely reflects hemorrhage.  Underlying mass is not excluded.  Cystoscopic correlation is advised.  Moderate fat and fluid containing periumbilical hernia (series 2/image 58).  Degenerative changes of the visualized thoracolumbar spine.  IMPRESSION: Multiple abnormal loops of distal/terminal ileum in the right lower abdomen, as described above, likely reflecting infectious/inflammatory enteritis, ischemia considered unlikely.  Normal appendix.  Suspected bladder hemorrhage, underlying mass not excluded. Cystoscopic correlation is advised.  Mildly nodular hepatic contour, correlate for cirrhosis.  Possible hepatic steatosis.  These results were called by telephone on 07/02/2011  at  2105 hours to  Dr. Donnetta Hutching, who verbally acknowledged these results.  Original Report Authenticated By: Charline Bills, M.D.    Medications: Scheduled Meds:   . ciprofloxacin  400 mg Intravenous Q12H  .  HYDROmorphone (DILAUDID) injection  1 mg Intravenous Once  .  HYDROmorphone (DILAUDID) injection  1 mg Intravenous Once  . metroNIDAZOLE  500 mg Oral Q8H  . ondansetron  4 mg Intravenous Once  . ondansetron  4 mg Intravenous Once  . pantoprazole  40 mg Oral Q1200  . sodium chloride  1,000 mL Intravenous Once  . sodium chloride  1,000 mL Intravenous Once   Continuous Infusions:   . 0.9 % NaCl with KCl 20 mEq / L 150 mL/hr at 07/03/11 0708  . DISCONTD: sodium chloride 125 mL/hr at 07/03/11 0745   PRN Meds:.acetaminophen,  acetaminophen, HYDROmorphone, iohexol, iohexol, ondansetron (ZOFRAN) IV, ondansetron, oxyCODONE-acetaminophen, traZODone  Assessment/Plan:  Principal Problem:  *Enteritis Active Problems:  Hematuria  Plan:  1. Enteritis.  Continue empiric abx for now.  Stool C diff is pending. Continue clear liquids for now  2. Hematuria with possibly underlying bladder tumor.  Patient has been trying to follow up with outpatient urology, but has been having a very difficult time with this due to lack of insurance issues.  Will ask urology to evaluate in the hospital.  Urine culture has also been sent and he is on cipro.  3. Dehydration, improving with IVF   LOS: 1 day   Cameka Rae [facility] Pager: [phone] 07/03/2011, 12:59 PM

## 2011-07-04 ENCOUNTER — Encounter (HOSPITAL_COMMUNITY): Payer: Self-pay | Admitting: Internal Medicine

## 2011-07-04 DIAGNOSIS — A0472 Enterocolitis due to Clostridium difficile, not specified as recurrent: Secondary | ICD-10-CM

## 2011-07-04 HISTORY — DX: Enterocolitis due to Clostridium difficile, not specified as recurrent: A04.72

## 2011-07-04 LAB — CBC
HCT: 43.9 % (ref 39.0–52.0)
MCHC: 33.3 g/dL (ref 30.0–36.0)
Platelets: 229 10*3/uL (ref 150–400)
RDW: 14.7 % (ref 11.5–15.5)

## 2011-07-04 LAB — BASIC METABOLIC PANEL
BUN: 14 mg/dL (ref 6–23)
Calcium: 8.7 mg/dL (ref 8.4–10.5)
Creatinine, Ser: 1.23 mg/dL (ref 0.50–1.35)
GFR calc Af Amer: 74 mL/min — ABNORMAL LOW (ref >90)
GFR calc non Af Amer: 64 mL/min — ABNORMAL LOW (ref >90)

## 2011-07-04 LAB — URINE CULTURE: Culture: NO GROWTH

## 2011-07-04 MED ORDER — METRONIDAZOLE 500 MG PO TABS: 500.0000 mg | ORAL_TABLET | Freq: Three times a day (TID) | ORAL | Status: AC

## 2011-07-04 MED ORDER — OXYCODONE-ACETAMINOPHEN 5-325 MG PO TABS: 1.0000 | ORAL_TABLET | ORAL | Status: AC | PRN

## 2011-07-04 NOTE — Discharge Instructions (Signed)
Clostridium Difficile Infection  Clostridium difficile (C. diff) is a bacteria found in the intestinal tract or colon. Under certain conditions, it causes diarrhea and sometimes severe disease. The severe form of the disease is known as pseudomembranous colitis (often called C. diff colitis). This disease can damage the lining of the colon or cause the colon to become enlarged (toxic megacolon).   CAUSES   Your colon normally contains many different bacteria, including C. diff. The balance of bacteria in your colon can change during illness. This is especially true when you take antibiotic medicine. Taking antibiotics may allow the C. diff to grow, multiply excessively, and make a toxin that then causes illness. The elderly and people with certain medical conditions have a greater risk of getting C. diff infections.  SYMPTOMS    Watery diarrhea.   Fever.   Fatigue.   Loss of appetite.   Nausea.   Abdominal swelling, pain, or tenderness.   Dehydration.  DIAGNOSIS   Your symptoms may make your caregiver suspicious of a C. diff infection, especially if you have used antibiotics in the preceding weeks. However, there are only 2 ways to know for certain whether you have a C. diff infection:   A lab test that finds the toxin in your stool.   The specific appearance of an abnormality (pseudomembrane) in your colon. This can only be seen by doing a sigmoidoscopy or colonoscopy. These procedures involve passing an instrument through your rectum to look at the inside of your colon.  Your caregiver will help determine if these tests are necessary.  TREATMENT    Most people are successfully treated with 1 of 2 specific antibiotics, usually given by mouth. Other antibiotics you are receiving are stopped if possible.   Intravenous (IV) fluids and correction of electrolyte imbalance may be necessary.   Rarely, surgery may be needed to remove the infected part of the intestines.   Careful hand washing by you and your  caregivers is important to prevent the spread of infection. In the hospital, your caregivers may also put on gowns and gloves to prevent the spread of the C. diff bacteria. Your room is also cleaned regularly with a hospital grade disinfectant.  HOME CARE INSTRUCTIONS   Drink enough fluids to keep your urine clear or pale yellow. Avoid milk, caffeine, and alcohol.   Ask your caregiver for specific rehydration instructions.   Try eating small, frequent meals rather than large meals.   Take your antibiotics as directed. Finish them even if you start to feel better.   Do not use medicines to slow diarrhea. This could delay healing or cause complications.   Wash your hands thoroughly after using the bathroom and before preparing food.   Make sure people who live with you wash their hands often, too.  SEEK MEDICAL CARE IF:   Diarrhea persists longer than expected or recurs after completing your course of antibiotic treatment for the C. diff infection.   You have trouble staying hydrated.  SEEK IMMEDIATE MEDICAL CARE IF:   You develop a new fever.   You have increasing abdominal pain or tenderness.   There is blood in your stools, or your stools are dark black and tarry.   You cannot hold down food or liquids.  MAKE SURE YOU:    Understand these instructions.   Will watch your condition.   Will get help right away if you are not doing well or get worse.  Document Released: 12/09/2004 Document Revised: 02/18/2011 Document 

## 2011-07-04 NOTE — ED Notes (Signed)
...    Donnetta Hutching, MD 07/04/11 2150

## 2011-07-04 NOTE — Discharge Summary (Signed)
Physician Discharge Summary  Patient ID: [name] MRN: [id number] DOB/AGE: [birth year] 56 y.o.  Admit date: 07/02/2011 Discharge date: 07/04/2011  Primary Care Physician:  No primary provider on file.   Discharge Diagnoses:    Principal Problem:  *Enteritis Active Problems:  Hematuria  Dehydration  Leukocytosis  C. difficile enteritis    Medication List  As of 07/04/2011  2:47 PM   STOP taking these medications         bismuth subsalicylate 262 MG/15ML suspension         TAKE these medications         metroNIDAZOLE 500 MG tablet   Commonly known as: FLAGYL   Take 1 tablet (500 mg total) by mouth every 8 (eight) hours.      oxyCODONE-acetaminophen 5-325 MG per tablet   Commonly known as: PERCOCET   Take 1-2 tablets by mouth every 4 (four) hours as needed.           Discharge Exam: Blood pressure 160/91, pulse 65, temperature 97.6 F (36.4 C), temperature source Oral, resp. rate 18, height 6\' 2"  (1.88 m), weight 106 kg (233 lb 11 oz), SpO2 95.00%. NAD CTA B S1, S2, RRR Soft, NT, BS+ No edema b/l  Disposition and Follow-up:  Discharge home today, follow up with pcp in 2 weeks Call Dr. Rudean Haskell office on Monday for further evaluation  Consults:  Telephone consult with Dr. Jerre Simon, urology   Significant Diagnostic Studies:  Ct Abdomen Pelvis W Contrast  07/02/2011  *RADIOLOGY REPORT*  Clinical Data: Right lower quadrant pain, nausea, diarrhea. History of diverticulitis.  CT ABDOMEN AND PELVIS WITH CONTRAST  Technique:  Multidetector CT imaging of the abdomen and pelvis was performed following the standard protocol during bolus administration of intravenous contrast.  Contrast: OMNIPAQUE IOHEXOL 300 MG/ML  SOLN  Comparison: 08/10/2005  Findings: Mild linear scarring versus atelectasis in the lingula and right lower lobe.  Mildly nodular hepatic contour, correlate for cirrhosis.  Possible hepatic steatosis.  Spleen, pancreas, and adrenal glands within  normal limits.  Gallbladder is unremarkable.  No intrahepatic or extrahepatic ductal dilatation.  No evidence of bowel obstruction.  Multiple abnormal loops of distal/terminal ileum in the right lower abdomen with mucosal thickening and surrounding mesenteric stranding/edema, likely reflecting infectious/inflammatory enteritis.  Normal appendix. Scattered mild colonic diverticulosis, without associated inflammatory changes.  No evidence of abdominal aortic aneurysm.  No suspicious abdominopelvic lymphadenopathy.  Small volume abdominopelvic ascites.  Central prostate gland indents the base of the bladder.  High density material in the bladder, at least some of which likely reflects hemorrhage.  Underlying mass is not excluded.  Cystoscopic correlation is advised.  Moderate fat and fluid containing periumbilical hernia (series 2/image 58).  Degenerative changes of the visualized thoracolumbar spine.  IMPRESSION: Multiple abnormal loops of distal/terminal ileum in the right lower abdomen, as described above, likely reflecting infectious/inflammatory enteritis, ischemia considered unlikely.  Normal appendix.  Suspected bladder hemorrhage, underlying mass not excluded. Cystoscopic correlation is advised.  Mildly nodular hepatic contour, correlate for cirrhosis.  Possible hepatic steatosis.  These results were called by telephone on 07/02/2011  at  2105 hours to  Dr. Donnetta Hutching, who verbally acknowledged these results.  Original Report Authenticated By: Charline Bills, M.D.    Brief H and P: For complete details please refer to admission H and P, but in brief [name] is an 56 y.o. male. Denies chronic medical problems; takes no chronic medication; had sudden onset of colicky generalized abdominal pain  since midday yesterday; associated with diffuse watery brown diarrhea; about 18 stools since midday yesterday; no blood or melena. Some nausea but no vomiting. No family history of gastrointestinal problem.    Has been having episodic gross hematuria for the past 3 months; associated with episodic dysuria ;treated with a course of sulfa antibiotics without significant improvement.  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  Quit smoking 4 years ago, but smoked for 35 years before that, half to one pack per day. Denies alcohol use.     Hospital Course:  This gentleman is admitted to the hospital with complaints of abdominal pain and diarrhea. CT scan done of the emergency room indicated in enteritis. His stool was positive for Clostridium difficile. He was started on oral Flagyl. Since then he has had no fever, and his white count has returned to normal. His diarrhea has also started to improve as has his abdominal pain. He is able to tolerate a solid diet without any vomiting. It is felt the patient is stable for discharge home and he will followup with his primary care physician.  Patient also has reported intermittent hematuria. CT scan done in the emergency room had shown suspected bladder hemorrhage, underlying mass not excluded. The patient's hemoglobin has remained stable. Case was discussed with Dr. Jerre Simon who recommends that the patient be evaluated on an outpatient basis. He will need further evaluation with cystoscopy. We will give him Dr. Jerre Simon information for outpatient followup.  Time spent on Discharge:  Signed: Owens Hara [facility] Pager: [phone] 07/04/2011, 2:47 PM

## 2011-07-04 NOTE — Progress Notes (Signed)
Subjective: Abdominal  Pain feels better today, no vomiting, diarrhea improving  Objective: Vital signs in last 24 hours: Temp:  [97.5 F (36.4 C)-98.4 F (36.9 C)] 97.6 F (36.4 C) (04/21 1050) Pulse Rate:  [59-77] 65  (04/21 1050) Resp:  [16-20] 18  (04/21 1050) BP: (97-160)/(58-91) 160/91 mmHg (04/21 1050) SpO2:  [93 %-98 %] 95 % (04/21 1050) Weight change:  Last BM Date: 07/03/11  Intake/Output from previous day: 04/20 0701 - 04/21 0700 In: 2305 [P.O.:480; I.V.:1825] Out: 500 [Urine:500] Total I/O In: 360 [P.O.:360] Out: -    Physical Exam: General: Alert, awake, oriented x3, in no acute distress. HEENT: No bruits, no goiter. Heart: Regular rate and rhythm, without murmurs, rubs, gallops. Lungs: Clear to auscultation bilaterally. Abdomen: Soft, nontender, nondistended, positive bowel sounds. Extremities: No clubbing cyanosis or edema with positive pedal pulses. Neuro: Grossly intact, nonfocal.    Lab Results: Basic Metabolic Panel:  Basename 07/04/11 0606 07/03/11 0655 07/02/11 1925  NA 140 138 --  K 4.2 4.3 --  CL 107 104 --  CO2 27 27 --  GLUCOSE 99 118* --  BUN 14 15 --  CREATININE 1.23 1.10 --  CALCIUM 8.7 8.6 --  MG -- -- 2.1  PHOS -- -- --   Liver Function Tests:  [facility] 07/02/11 1925  AST 88*  ALT 79*  ALKPHOS 77  BILITOT 1.0  PROT 7.9  ALBUMIN 4.1    Basename 07/02/11 1925  LIPASE 19  AMYLASE --   No results found for this basename: AMMONIA:2 in the last 72 hours CBC:  Basename 07/04/11 0606 07/03/11 0655 07/02/11 1925  WBC 9.7 11.9* --  NEUTROABS -- -- 11.9*  HGB 14.6 15.8 --  HCT 43.9 46.7 --  MCV 83.1 83.2 --  PLT 229 240 --   Cardiac Enzymes: No results found for this basename: CKTOTAL:3,CKMB:3,CKMBINDEX:3,TROPONINI:3 in the last 72 hours BNP: No results found for this basename: PROBNP:3 in the last 72 hours D-Dimer: No results found for this basename: DDIMER:2 in the last 72 hours CBG: No results found for this  basename: GLUCAP:6 in the last 72 hours Hemoglobin A1C:  Basename 07/02/11 1925  HGBA1C 5.4   Fasting Lipid Panel: No results found for this basename: CHOL,HDL,LDLCALC,TRIG,CHOLHDL,LDLDIRECT in the last 72 hours Thyroid Function Tests:  Basename 07/02/11 1925  TSH 1.332  T4TOTAL --  FREET4 --  T3FREE --  THYROIDAB --   Anemia Panel: No results found for this basename: VITAMINB12,FOLATE,FERRITIN,TIBC,IRON,RETICCTPCT in the last 72 hours Coagulation:  Basename 07/02/11 1925  LABPROT 14.0  INR 1.06   Urine Drug Screen: Drugs of Abuse  No results found for this basename: labopia, cocainscrnur, labbenz, amphetmu, thcu, labbarb    Alcohol Level: No results found for this basename: ETH:2 in the last 72 hours Urinalysis:  Basename 07/02/11 2120  COLORURINE AMBER*  LABSPEC <1.005*  PHURINE 5.5  GLUCOSEU NEGATIVE  HGBUR LARGE*  BILIRUBINUR NEGATIVE  KETONESUR NEGATIVE  PROTEINUR 30*  UROBILINOGEN 0.2  NITRITE NEGATIVE  LEUKOCYTESUR NEGATIVE    Recent Results (from the past 240 hour(s))  CLOSTRIDIUM DIFFICILE BY PCR     Status: Abnormal   Collection Time   07/03/11  1:30 PM      Component Value Range Status Comment   C difficile by pcr POSITIVE (*) NEGATIVE  Final     Studies/Results: Ct Abdomen Pelvis W Contrast  07/02/2011  *RADIOLOGY REPORT*  Clinical Data: Right lower quadrant pain, nausea, diarrhea. History of diverticulitis.  CT ABDOMEN AND PELVIS WITH CONTRAST  Technique:  Multidetector CT imaging of the abdomen and pelvis was performed following the standard protocol during bolus administration of intravenous contrast.  Contrast: OMNIPAQUE IOHEXOL 300 MG/ML  SOLN  Comparison: 08/10/2005  Findings: Mild linear scarring versus atelectasis in the lingula and right lower lobe.  Mildly nodular hepatic contour, correlate for cirrhosis.  Possible hepatic steatosis.  Spleen, pancreas, and adrenal glands within normal limits.  Gallbladder is unremarkable.  No  intrahepatic or extrahepatic ductal dilatation.  No evidence of bowel obstruction.  Multiple abnormal loops of distal/terminal ileum in the right lower abdomen with mucosal thickening and surrounding mesenteric stranding/edema, likely reflecting infectious/inflammatory enteritis.  Normal appendix. Scattered mild colonic diverticulosis, without associated inflammatory changes.  No evidence of abdominal aortic aneurysm.  No suspicious abdominopelvic lymphadenopathy.  Small volume abdominopelvic ascites.  Central prostate gland indents the base of the bladder.  High density material in the bladder, at least some of which likely reflects hemorrhage.  Underlying mass is not excluded.  Cystoscopic correlation is advised.  Moderate fat and fluid containing periumbilical hernia (series 2/image 58).  Degenerative changes of the visualized thoracolumbar spine.  IMPRESSION: Multiple abnormal loops of distal/terminal ileum in the right lower abdomen, as described above, likely reflecting infectious/inflammatory enteritis, ischemia considered unlikely.  Normal appendix.  Suspected bladder hemorrhage, underlying mass not excluded. Cystoscopic correlation is advised.  Mildly nodular hepatic contour, correlate for cirrhosis.  Possible hepatic steatosis.  These results were called by telephone on 07/02/2011  at  2105 hours to  Dr. Donnetta Hutching, who verbally acknowledged these results.  Original Report Authenticated By: Charline Bills, M.D.    Medications: Scheduled Meds:   . diphenhydrAMINE      . metroNIDAZOLE  500 mg Oral Q8H  . pantoprazole  40 mg Oral Q1200  . sodium chloride      . DISCONTD: ciprofloxacin  400 mg Intravenous Q12H   Continuous Infusions:   . sodium chloride 75 mL/hr at 07/04/11 0600   PRN Meds:.acetaminophen, acetaminophen, diphenhydrAMINE, diphenhydrAMINE, HYDROmorphone, ondansetron (ZOFRAN) IV, ondansetron, oxyCODONE-acetaminophen, traZODone  Assessment/Plan:  Principal Problem:   *Enteritis Active Problems:  Hematuria  Dehydration  Leukocytosis   Plan:    C diff enteritis appears to be improving. Will continue with flagyl for a total of 14 days.  Advance diet to low residue today.  If he tolerates diet, then potentially he can be discharged later today.  Regarding his hematuria, I have requested a urology consultation.  If Dr. Jerre Simon is unable to see the patient today, then potentially he can be followed up in the outpatient setting.   LOS: 2 days   Jakari Sada [facility] Pager: [phone] 07/04/2011, 1:08 PM

## 2011-07-27 ENCOUNTER — Emergency Department (HOSPITAL_COMMUNITY)
Admission: EM | Admit: 2011-07-27 | Discharge: 2011-07-27 | Disposition: A | Discharge status: Home / Self Care | Payer: Medicaid Other | Attending: Emergency Medicine | Admitting: Emergency Medicine

## 2011-07-27 ENCOUNTER — Encounter (HOSPITAL_COMMUNITY): Payer: Self-pay | Admitting: *Deleted

## 2011-07-27 DIAGNOSIS — R109 Unspecified abdominal pain: Secondary | ICD-10-CM | POA: Insufficient documentation

## 2011-07-27 DIAGNOSIS — R300 Dysuria: Secondary | ICD-10-CM | POA: Insufficient documentation

## 2011-07-27 DIAGNOSIS — A0472 Enterocolitis due to Clostridium difficile, not specified as recurrent: Secondary | ICD-10-CM | POA: Insufficient documentation

## 2011-07-27 DIAGNOSIS — R10819 Abdominal tenderness, unspecified site: Secondary | ICD-10-CM | POA: Insufficient documentation

## 2011-07-27 HISTORY — DX: Diverticulitis of intestine, part unspecified, without perforation or abscess without bleeding: K57.92

## 2011-07-27 MED ORDER — OXYCODONE-ACETAMINOPHEN 5-325 MG PO TABS: 1.0000 | ORAL_TABLET | Freq: Four times a day (QID) | ORAL | Status: AC | PRN

## 2011-07-27 MED ORDER — CIPROFLOXACIN HCL 500 MG PO TABS: 500.0000 mg | ORAL_TABLET | Freq: Two times a day (BID) | ORAL | Status: AC

## 2011-07-27 MED ORDER — METRONIDAZOLE 500 MG PO TABS: 500.0000 mg | ORAL_TABLET | Freq: Three times a day (TID) | ORAL | Status: AC

## 2011-07-27 NOTE — Discharge Instructions (Signed)
Abdominal Pain Abdominal pain can be caused by many things. Your caregiver decides the seriousness of your pain by an examination and possibly blood tests and X-rays. Many cases can be observed and treated at home. Most abdominal pain is not caused by a disease and will probably improve without treatment. However, in many cases, more time must pass before a clear cause of the pain can be found. Before that point, it may not be known if you need more testing, or if hospitalization or surgery is needed. HOME CARE INSTRUCTIONS   Do not take laxatives unless directed by your caregiver.   Take pain medicine only as directed by your caregiver.   Only take over-the-counter or prescription medicines for pain, discomfort, or fever as directed by your caregiver.   Try a clear liquid diet (broth, tea, or water) for as long as directed by your caregiver. Slowly move to a bland diet as tolerated.  SEEK IMMEDIATE MEDICAL CARE IF:   The pain does not go away.   You have a fever.   You keep throwing up (vomiting).   The pain is felt only in portions of the abdomen. Pain in the right side could possibly be appendicitis. In an adult, pain in the left lower portion of the abdomen could be colitis or diverticulitis.   You pass bloody or black tarry stools.  MAKE SURE YOU:   Understand these instructions.   Will watch your condition.   Will get help right away if you are not doing well or get worse.  Document Released: 12/09/2004 Document Revised: 02/18/2011 Document Reviewed: 10/18/2007 [facility] Patient Information 2012 [place], [place].Clostridium Difficile Infection Clostridium difficile (C. diff) is a germ found in the intestines. C. diff infection can occur after taking antibiotic medicines. C. diff infection can cause watery poop (diarrhea) or severe disease. HOME CARE   Drink enough fluids to keep your pee (urine) clear or pale yellow. Avoid milk, caffeine, and alcohol.   Ask your doctor how to  replace body fluid losses (rehydrate).   Eat small meals more often rather than large meals.   Take your medicine (antibiotics) as told. Finish it even if you start to feel better.   Do not  use medicines to slow the watery poop.   Wash your hands well after using the bathroom and before preparing food.   Make sure people who live with you wash their hands often.  GET HELP RIGHT AWAY IF:   The watery poop does not stop, or it comes back after you finish your medicine.   You feel very dry or thirsty (dehydrated).   You have a fever.   You have more belly (abdominal) pain or tenderness.   There is blood in your poop (stool), or your poop is black and tar-like.   You cannot eat food or drink liquids without throwing up (vomiting).  MAKE SURE YOU:   Understand these instructions.   Will watch your condition.   Will get help right away if you are not doing well or get worse.  Document Released: 12/27/2008 Document Revised: 02/18/2011 Document Reviewed: 08/07/2010 [facility] Patient Information 2012 [place], [place].

## 2011-07-27 NOTE — ED Notes (Signed)
abd  Since yesterday, Adm with C diff in April.  No vomiting,  Having diarrhea.  Nausea

## 2011-07-27 NOTE — ED Provider Notes (Signed)
History  Scribed for Geoffery Lyons, MD, the patient was seen in room APA12/APA12. This chart was scribed by Candelaria Stagers. The patient's care started at 3:15 PM    CSN: [id number]  Arrival date & time 07/27/11  1422   First MD Initiated Contact with Patient 07/27/11 1512      Chief Complaint  Patient presents with  . Abdominal Pain     Patient is a 56 y.o. male presenting with abdominal pain. The history is provided by the patient.  Abdominal Pain The primary symptoms of the illness include abdominal pain, nausea, diarrhea and dysuria. The primary symptoms of the illness do not include fever or vomiting. The current episode started more than 2 days ago. The onset of the illness was gradual. The problem has been gradually worsening.  The dysuria is not associated with hematuria.  Associated with: C diff dx. The patient states that she believes she is currently not pregnant. The patient has not had a change in bowel habit. Symptoms associated with the illness do not include chills or hematuria. Significant associated medical issues include diverticulitis.   [name] is a 56 y.o. male who presents to the Emergency Department complaining of mid abdominal pain mostly on the right side that radiates to his lower back with intermittent sharp pains that became worse yesterday.  Pt states that he has had similar sx several weeks ago and was dx with C diff after being admitted to the hospital three weeks ago.  He was prescribed antibiotics and states that he seemed to be getting better until the pain returned several days ago.  He is experiencing diarrhea, nausea, or fever.  He denies vomiting.  Pt has no h/o surgery.  He has h/o diverticulitis.    Past Medical History  Diagnosis Date  . Gross hematuria   . No pertinent past medical history   . C. difficile enteritis 07/04/2011  . Diverticulitis     Past Surgical History  Procedure Date  . Elbow surgery     Family History  Problem  Relation Age of Onset  . Heart failure Father   . Hypertension Mother     History  Substance Use Topics  . Smoking status: Former Smoker -- 0.5 packs/day for 35 years    Quit date: 12/03/2007  . Smokeless tobacco: Not on file  . Alcohol Use: No      Review of Systems  Constitutional: Negative for fever and chills.  Gastrointestinal: Positive for nausea, abdominal pain and diarrhea. Negative for vomiting and blood in stool.  Genitourinary: Positive for dysuria. Negative for hematuria.  All other systems reviewed and are negative.    Allergies  Review of patient's allergies indicates no known allergies.  Home Medications  No current outpatient prescriptions on file.  BP 152/80  Pulse 77  Temp(Src) 97.2 F (36.2 C) (Oral)  Resp 20  Ht 6\' 2"  (1.88 m)  Wt 226 lb (102.513 kg)  BMI 29.02 kg/m2  SpO2 97%  Physical Exam  Nursing note and vitals reviewed. Constitutional: He is oriented to person, place, and time. He appears well-developed and well-nourished. No distress.  HENT:  Head: Normocephalic and atraumatic.  Eyes: Conjunctivae and EOM are normal. Pupils are equal, round, and reactive to light.  Neck: Normal range of motion. Neck supple.  Cardiovascular: Normal rate and regular rhythm.   Pulmonary/Chest: Effort normal and breath sounds normal. He has no wheezes. He has no rales.  Abdominal: There is tenderness (tenderness across entire lower  abdomen, right slightly more tender than left). There is no rebound and no guarding.  Musculoskeletal: Normal range of motion. He exhibits no edema.  Neurological: He is alert and oriented to person, place, and time.  Skin: Skin is warm and dry. He is not diaphoretic.  Psychiatric: He has a normal mood and affect. His behavior is normal. Judgment and thought content normal.    ED Course  Procedures   DIAGNOSTIC STUDIES: Oxygen Saturation is 97% on room air, normal by my interpretation.    COORDINATION OF CARE:  3:26 PM  Discussed course of care and will prescribe antibiotics and pain medication.    Labs Reviewed - No data to display No results found.   No diagnosis found.    MDM  The patient presents with symptoms similar to the Cdiff he was recently treated for.  I suspect this was partially treated and will prescribe cipro and flagyl and pain meds.  He will be discharged to home, to follow up prn.  Abd is benign, doubt appendicitis or other surgical process.  He will return if worsens.     I personally performed the services described in this documentation, which was scribed in my presence. The recorded information has been reviewed and considered.          Geoffery Lyons, MD 07/27/11 1556

## 2011-07-28 NOTE — Progress Notes (Signed)
UR chart review completed. [name]  

## 2011-09-08 ENCOUNTER — Other Ambulatory Visit (HOSPITAL_COMMUNITY): Payer: Medicaid Other

## 2011-09-08 ENCOUNTER — Other Ambulatory Visit (HOSPITAL_COMMUNITY): Payer: Self-pay | Admitting: Urology

## 2011-09-08 DIAGNOSIS — R310 Gross hematuria: Secondary | ICD-10-CM

## 2011-09-15 ENCOUNTER — Ambulatory Visit (HOSPITAL_COMMUNITY)
Admission: RE | Admit: 2011-09-15 | Discharge: 2011-09-15 | Disposition: A | Discharge status: Home / Self Care | Payer: Medicaid Other | Source: Ambulatory Visit | Attending: Urology | Admitting: Urology

## 2011-09-15 DIAGNOSIS — K7460 Unspecified cirrhosis of liver: Secondary | ICD-10-CM | POA: Insufficient documentation

## 2011-09-15 DIAGNOSIS — D732 Chronic congestive splenomegaly: Secondary | ICD-10-CM | POA: Insufficient documentation

## 2011-09-15 DIAGNOSIS — K766 Portal hypertension: Secondary | ICD-10-CM | POA: Insufficient documentation

## 2011-09-15 DIAGNOSIS — R310 Gross hematuria: Secondary | ICD-10-CM | POA: Insufficient documentation

## 2011-09-15 DIAGNOSIS — R918 Other nonspecific abnormal finding of lung field: Secondary | ICD-10-CM | POA: Insufficient documentation

## 2011-09-15 LAB — BUN: BUN: 16 mg/dL (ref 6–23)

## 2011-09-15 LAB — CREATININE, SERUM: Creatinine, Ser: 1.09 mg/dL (ref 0.50–1.35)

## 2011-09-15 MED ORDER — IOHEXOL 300 MG/ML  SOLN
125.0000 mL | Freq: Once | INTRAMUSCULAR | Status: AC | PRN
  Administered 2011-09-15: 125 mL via INTRAVENOUS

## 2011-10-20 ENCOUNTER — Encounter (HOSPITAL_COMMUNITY): Payer: Self-pay | Admitting: Pharmacy Technician

## 2011-10-21 NOTE — H&P (Signed)
[name], CAPP NO.:  [id number]  MEDICAL RECORD NO.:  [id number]  LOCATION:                                 FACILITY:  PHYSICIAN:  Ky Barban, M.D.DATE OF BIRTH:  01-Jan-1956  DATE OF ADMISSION:  10/26/2011 DATE OF DISCHARGE:  LH                             HISTORY & PHYSICAL   CHIEF COMPLAINT:  Bladder tumor.  A 56 year old gentleman who was [facility]. The patient is having on and off gross hematuria since February.  His urinary stream is also slow with dysuria and no fever or chills.  He had gross hematuria in 2011, and he was treated with antibiotic.  No further workup was done.  His AUA score is 9.  He is complaining of pain over his left hip area.  So, he had a CT scan done which shows that there is a large fungating type of mass in the bladder suspicious for carcinoma, and has normal upper tracts.  CT was done with and without contrast. There is extensive involvement of the right bladder wall.  It is worrisome for carcinoma, then subsequently I did a cystoscopy, a grossly looked like solid with a papillary tumor in the bladder and also there is a papillary growth in the prostatic urethra.  The bladder and prostate were also enlarged causing bladder neck obstruction with a trabeculated bladder.  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  His urine culture shows mixed urogenital flora probably contaminated. Cystoscopy as I mentioned shows the large tumor in the bladder most likely I have told the patient most likely it is cancer but also there is suspicion small pulmonary nodules on the lung bases are worrisome for metastatic disease.  There is cirrhosis of the liver seen on CT with portal hypertension and splenomegaly.  No obvious bony lesions are seen. Small inguinal lymph nodes are noted.  There is no obvious extension of this growth outside  the bladder.  PHYSICAL EXAMINATION:  VITAL SIGNS:  Blood pressure is 122/77, temperature 98.8. CENTRAL NERVOUS SYSTEM:  No gross neurological deficit. HEAD, NECK, EYE, ENT:  Negative. CHEST:  Symmetrical.  Normal breath sounds. HEART:  Regular sinus rhythm.  No murmur. ABDOMEN:  Soft, flat.  Liver, spleen, kidneys not palpable. EXTERNAL GENITALIA:  Unremarkable. RECTAL:  Normal sphincter tone.  No rectal mass.  Prostate 1.5+ smooth and firm, nontender.  IMPRESSION:  Bladder tumor, possible metastatic disease, and benign prostatic hypertrophy with bladder neck obstruction, and also possible for bladder tumor growing into the prostatic urethra.  PLAN:  TURBT and TUR prostate.  I discussed with the patient and his wife with the problem.  I told him most likely it is cancerous and we need to start with TURBT and I will see if I can remove this tumor, it is only possible if it is superficial.  Once it is removed, we will send it to the lab to check what it is and how deep it is.  If there is anything else needs to be done, during the procedure if there is a bladder perforation, any injury to the surrounding organs,  which will require additional surgery, I have made it clear to them that these are the possibility and also I am going to resect his prostate normally.  I should not be doing that but he has obstructive symptoms and also there is a tumor growing in the prostatic urethra along with the prostate causing bladder neck obstruction, so I have to resect it, so he can urinate.  He is coming as outpatient, we will do that, keep him overnight in the hospital.     Ky Barban, M.D.     MIJ/MEDQ  D:  10/20/2011  T:  10/21/2011  Job:  [id number]

## 2011-10-22 ENCOUNTER — Encounter (HOSPITAL_COMMUNITY): Payer: Self-pay

## 2011-10-22 ENCOUNTER — Encounter (HOSPITAL_COMMUNITY)
Admission: RE | Admit: 2011-10-22 | Discharge: 2011-10-22 | Disposition: A | Discharge status: Home / Self Care | Payer: Medicaid Other | Source: Ambulatory Visit | Attending: Urology | Admitting: Urology

## 2011-10-22 LAB — HEMOGLOBIN AND HEMATOCRIT, BLOOD: HCT: 48.5 % (ref 39.0–52.0)

## 2011-10-22 NOTE — Patient Instructions (Addendum)
[address] [facility] [place] [facility]  10/22/2011   Your procedure is scheduled on:  10/26/11  Report to [facility] at 10:30 AM.  Call this number if you have problems the morning of surgery: [phone]   Remember:   Do not eat food or drink:After Midnight.     Take these medicines the morning of surgery with A SIP OF WATER: none   Do not wear jewelry, make-up or nail polish.  Do not wear lotions, powders, or perfumes. You may wear deodorant.  Do not shave 48 hours prior to surgery. Men may shave face and neck.  Do not bring valuables to the hospital.  Contacts, dentures or bridgework may not be worn into surgery.  Leave suitcase in the car. After surgery it may be brought to your room.  For patients admitted to the hospital, checkout time is 11:00 AM the day of discharge.   Patients discharged the day of surgery will not be allowed to drive home.  Name and phone number of your driver: family  Special Instructions: CHG Shower Use Special Wash: 1/2 bottle night before surgery and 1/2 bottle morning of surgery.   Please read over the following fact sheets that you were given: Pain Booklet, Coughing and Deep Breathing, MRSA Information, Surgical Site Infection Prevention, Anesthesia Post-op Instructions and Care and Recovery After Surgery  PATIENT INSTRUCTIONS POST-ANESTHESIA  IMMEDIATELY FOLLOWING SURGERY:  Do not drive or operate machinery for the first twenty four hours after surgery.  Do not make any important decisions for twenty four hours after surgery or while taking narcotic pain medications or sedatives.  If you develop intractable nausea and vomiting or a severe headache please notify your doctor immediately.  FOLLOW-UP:  Please make an appointment with your surgeon as instructed. You do not need to follow up with anesthesia unless specifically instructed to do so.  WOUND CARE INSTRUCTIONS (if applicable):  Keep a dry clean dressing on the anesthesia/puncture wound site if there is drainage.   Once the wound has quit draining you may leave it open to air.  Generally you should leave the bandage intact for twenty four hours unless there is drainage.  If the epidural site drains for more than 36-48 hours please call the anesthesia department.  QUESTIONS?:  Please feel free to call your physician or the hospital operator if you have any questions, and they will be happy to assist you.     Transurethral Resection of the Prostate Transurethral Resection of the Prostate (TURP) is often treatment for non-cancerous (benign) prostatic hyperplasia (BPH) or prostate cancer. BPH commonly begins in middle aged men and can cause symptoms at any age thereafter. The complications that stem from these problems, such as recurrent infection and bladder control and emptying problems, are often helped by this procedure. Both conditions usually cause the prostate to increase in size. TURP is a major surgery that removes part of the prostate gland. The goal is to remove enough prostate to allow for unobstructed flow of urine. TREATMENT This surgery (TURP procedure) is done using an instrument like a narrow telescope to look into your bladder. This is then used to remove enlarged pieces of your prostate, one piece at a time. This removes the blockage and makes it easier for you to urinate. This is called a transurethral resection of the prostate. A lesser procedure is sometimes done. In this procedure, small cuts are made in the prostate. This lessens the prostates pressure on the urethra. This is called a transurethral incision (cut  by the surgeon) of the prostate (TUIP). You will probably be comfortable soon after the operation, but it will take 10 to 12 weeks, or longer, for your prostate to heal after you leave the hospital.  LET YOUR CAREGIVER KNOW ABOUT:  Allergies.   Medications taken including herbs, eye drops, over the counter medications, and creams.   Use of steroids (by mouth or creams).   Previous  problems with anesthetics or Novocaine.   History of blood clots (thrombophlebitis).   History of bleeding or blood problems.   Previous surgery.   Previous prostate infections.   Other health problems.  RISKS AND COMPLICATIONS  Rare injury to the bowel (intestine).   Rare injury to the bladder or adjacent blood vessels.   Intestinal/bowel obstruction.   Scarring called "stricture" that cause later problems with the flow of urine.   Bleeding and the need for blood transfusion (more common in those with prostate cancer and those who have previously received radiation therapy).   Inability to control your urine (incontinence). This is more common in those with prostate cancer and those who have received radiation therapy.   Injury to one of the ureters (tubes that drain the kidneys into the bladder) and/or urethra (the tube that drains the bladder).   Injury to the capsule that holds the prostate. This can lead to leakage of fluid and urine into the belly (abdomen).   The operation can possibly lead to impotence. This is the inability to get an erection. Treatments are available for these types of problems.   Rare over absorption of the fluids used during the operation. This can then cause low blood levels of sodium, brain swelling, strain on the heart, and fluid accumulation in the lungs. This is more common in long procedures.   Infection.   Blood clots in the legs.   As with any major surgery, there is always the rare chance of a complicating stroke, heart attack, or other complications that will be discussed with you by the surgeon and anesthesiologist.  BEFORE THE PROCEDURE   You may be asked to temporarily adjust your diet. If so, your caregiver will give you specific recommendations.   If you are on blood thinners, stop taking them before the operation, or as your caregiver advises.   You should have nothing to eat or drink after midnight prior to your surgery or as  suggested by your caregiver. You may have a sip of water to take medications not stopped for the procedure  PROCEDURE  This operation is performed after you have been given a medication to help you sleep (anesthetic), or with a spinal block. The spinal block keeps you awake but numb from the waist down.  No cut or incision is needed. During the operation, your surgeon passes a viewing and cutting instrument (resectoscope) through the penis into the prostate gland. The instrument contains an electric cutting edge. From inside the prostate, this cutter is used to remove part of the prostate.  HOME CARE INSTRUCTIONS  For your own protection, observe the following precautions for 10 days after your operation.  You may go home with a catheter. Take care of it as directed. You will receive instruction on catheter care.   After catheter removal, empty the bladder whenever you feel a definite desire. Do not try to hold the urine for long periods of time.   For 10 days, avoid all lifting, straining, running, strenuous work, walks longer than a couple blocks, riding in a car  for extended periods, and sexual relations.   Take 2 tablespoons of heavy mineral oil or Metamucil night and morning for 3 or 4 days. After that, gradually reduce the dose to one or two teaspoons twice daily. Stop it after the stools have been normal for a week. If you become constipated, do not strain to move your bowels. You may use an enema. Notify your caregiver about problems.   Even after complete healing, you may continue to urinate once or twice during the night.   In addition to your usual medications, you may be given an antibiotic to take for 10-14 days. Notify your caregiver if you have any side effects or problems with the medication.   Avoid alcohol and caffeinated drinks for 2 weeks, as they are irritating to the bladder. Decaffeinated drinks are fine.   Eat a regular diet, avoiding spicy foods for 2 weeks.   You may  continue non-strenuous activities. It is always important to keep active after an operation. This lessens the chance of developing blood clots.   You may see some recurrence of blood in the urine after discharge from the hospital. Even a small amount of blood colors the urine very red. If this occurs, force fluids again as you did in the hospital. This is generally not a concern.  SEEK MEDICAL CARE IF:   You have chills or night sweats.   You are leaking around your catheter or have problems with your catheter.   You develop side effects that you think are coming from your medications.  SEEK IMMEDIATE MEDICAL CARE IF:   You are suddenly unable to urinate. This is an emergency.   You develop shortness of breath or chest pains.   Bleeding persists or clots develop.   You have a fever.   You develop pain in your back or over your lower belly (abdomen).   You develop pain or swelling in your legs.   You develop swelling in your abdomen or have a sudden weight gain.   The problems get worse rather than better.   Transurethral Resection, Bladder Tumor A cancerous growth (tumor) can develop on the inside wall of the bladder. The bladder is the organ that holds urine. One way to remove the tumor is a procedure called a transurethral resection. The tumor is removed (resected) through the tube that carries urine from the bladder out of the body (urethra). No cuts (incisions) are made in the skin. Instead, the procedure is done through a thin telescope, called a resectoscope. Attached to it is a light and usually a tiny camera. The resectoscope is put into the urethra. In men, the urethra opens at the end of the penis. In women, it opens just above the vagina.  A transurethral resection is usually used to remove tumors that have not gotten too big or too deep. These are called Stage 0, Stage 1 or Stage 2 bladder cancers. LET YOUR CAREGIVER KNOW ABOUT:  On the day of the procedure, your  caregivers will need to know the last time you had anything to eat or drink. This includes water, gum, and candy. In advance, make sure they know about:   Any allergies.   All medications you are taking, including:   Herbs, eyedrops, over-the-counter medications and creams.   Blood thinners (anticoagulants), aspirin or other drugs that could affect blood clotting.   Use of steroids (by mouth or as creams).   Previous problems with anesthetics, including local anesthetics.   Possibility of  pregnancy, if this applies.   Any history of blood clots.   Any history of bleeding or other blood problems.   Previous surgery.   Smoking history.   Any recent symptoms of colds or infections.   Other health problems.  RISKS AND COMPLICATIONS This is usually a safe procedure. Every procedure has risks, though. For a transurethral resection, they include:  Infection. Antibiotic medication would need to be taken.   Bleeding.   Light bleeding may last for several days after the procedure.   If bleeding continues or is heavy, the bladder may need rinsing. Or, a new catheter might be put in for awhile.   Sometimes bed rest is needed.   Urination problems.   Pain and burning can occur when urinating. This usually goes away in a few days.   Scarring from the procedure can block the flow of urine.   Bladder damage.   It can be punctured or torn during removal of the tumor. If this happens, a catheter might be needed for longer. Antibiotics would be taken while the bladder heals.   Urine can leak through the hole or tear into the abdomen. If this happens, surgery may be needed to repair the bladder.  BEFORE THE PROCEDURE   A medical evaluation will be done. This may include:   A physical examination.   Urine test. This is to make sure you do not have a urinary tract infection.   Blood tests.   A test that checks the heart's rhythm (electrocardiogram).   Talking with an  anesthesiologist. This is the person who will be in charge of the medication (anesthesia) to keep you from feeling pain during the transurethral resection. You might be asleep during the procedure (general anesthesia) or numb from the waist down, but awake during the procedure (spinal anesthesia). Ask your surgeon what to expect.   The person who is having a transurethral resection needs to give what is called informed consent. This requires signing a legal paper that gives permission for the procedure. To give informed consent:   You must understand how the procedure is done and why.   You must be told all the risks and benefits of the procedure.   You must sign the consent. Sometimes a legal guardian can do this.   Signing should be witnessed by a healthcare professional.   The day before the surgery, eat only a light dinner. Then, do not eat or drink anything for at least 8 hours before the surgery. Ask your caregiver if it is OK to take any needed medicines with a sip of water.   Arrive at least an hour before the surgery or whenever your surgeon recommends. This will give you time to check in and fill out any needed paperwork.  PROCEDURE  The preparation:   You will change into a hospital gown.   A needle will be inserted in your arm. This is an intravenous access tube (IV). Medication will be able to flow directly into your body through this needle.   Small monitors will be put on your body. They are used to check your heart, blood pressure, and oxygen level.   You might be given medication that will help you relax (sedative).   You will be given a general anesthetic or spinal anesthesia.   The procedure:   Once you are asleep or numb from the waist down, your legs will be placed in stirrups.   The resectoscope will be passed through the urethra  into the bladder.   Fluid will be passed through the resectoscope. This will fill the bladder with water.   The surgeon will  examine the bladder through the scope. If the scope has a camera, it can take pictures from inside the bladder. They can be projected onto a TV screen.   The surgeon will use various tools to remove the tumor in small pieces. Sometimes a laser (a beam of light energy) is used. Other tools may use electric current.   A tube (catheter) will often be placed so that urine can drain into a bag outside the body. This process helps stop bleeding. This tube keeps blood clots from blocking the urethra.   The procedure usually takes 30 to 45 minutes.  AFTER THE PROCEDURE   You will stay in a recovery area until the anesthesia has worn off. Your blood pressure and pulse will be checked every so often. Then you will be taken to a hospital room.   You may continue to get fluids through the IV for awhile.   Some pain is normal. The catheter might be uncomfortable. Pain is usually not severe. If it is, ask for pain medicine.   Your urine may look bloody after a transurethral resection. This is normal.   If bleeding is heavy, a hospital caregiver may rinse out the bladder (irrigation) through the catheter.   Once the urine is clear, the catheter will be taken out.   You will need to stay in the hospital until you can urinate on your own.   Most people stay in the hospital for up to 4 days.  PROGNOSIS   Transurethral resection is considered the best way to treat bladder tumors that are not too far along. For most people, the treatment is successful. Sometimes, though, more treatment is needed.   Bladder cancers can come back even after a successful procedure. Because of this, be sure to have a checkup with your caregiver every 3 to 6 months. If everything is OK for 3 years, you can reduce the checkups to once a year.  Document Released: 12/26/2008 Document Revised: 02/18/2011 Document Reviewed: 12/26/2008 [facility] Patient Information 2012 [place], [place].

## 2011-10-26 ENCOUNTER — Encounter (HOSPITAL_COMMUNITY): Payer: Self-pay | Admitting: *Deleted

## 2011-10-26 ENCOUNTER — Ambulatory Visit (HOSPITAL_COMMUNITY)
Admission: RE | Admit: 2011-10-26 | Discharge: 2011-10-27 | Disposition: A | Discharge status: Home / Self Care | Payer: Medicaid Other | Source: Ambulatory Visit | Attending: Urology | Admitting: Urology

## 2011-10-26 ENCOUNTER — Encounter (HOSPITAL_COMMUNITY)
Admission: RE | Disposition: A | Discharge status: Home / Self Care | Payer: Self-pay | Source: Ambulatory Visit | Attending: Urology

## 2011-10-26 ENCOUNTER — Ambulatory Visit (HOSPITAL_COMMUNITY): Payer: Medicaid Other | Admitting: Anesthesiology

## 2011-10-26 ENCOUNTER — Encounter (HOSPITAL_COMMUNITY): Payer: Self-pay | Admitting: Anesthesiology

## 2011-10-26 DIAGNOSIS — N401 Enlarged prostate with lower urinary tract symptoms: Secondary | ICD-10-CM | POA: Insufficient documentation

## 2011-10-26 DIAGNOSIS — Z01812 Encounter for preprocedural laboratory examination: Secondary | ICD-10-CM | POA: Insufficient documentation

## 2011-10-26 DIAGNOSIS — R3911 Hesitancy of micturition: Secondary | ICD-10-CM | POA: Insufficient documentation

## 2011-10-26 DIAGNOSIS — R300 Dysuria: Secondary | ICD-10-CM | POA: Insufficient documentation

## 2011-10-26 DIAGNOSIS — C675 Malignant neoplasm of bladder neck: Secondary | ICD-10-CM | POA: Insufficient documentation

## 2011-10-26 DIAGNOSIS — N320 Bladder-neck obstruction: Secondary | ICD-10-CM | POA: Insufficient documentation

## 2011-10-26 DIAGNOSIS — N138 Other obstructive and reflux uropathy: Secondary | ICD-10-CM | POA: Insufficient documentation

## 2011-10-26 DIAGNOSIS — Z01810 Encounter for preprocedural cardiovascular examination: Secondary | ICD-10-CM | POA: Insufficient documentation

## 2011-10-26 HISTORY — PX: TRANSURETHRAL RESECTION OF BLADDER TUMOR: SHX2575

## 2011-10-26 HISTORY — PX: TRANSURETHRAL RESECTION OF PROSTATE: SHX73

## 2011-10-26 SURGERY — TURBT (TRANSURETHRAL RESECTION OF BLADDER TUMOR)
Anesthesia: Spinal | Wound class: Clean Contaminated

## 2011-10-26 MED ORDER — MIDAZOLAM HCL 2 MG/2ML IJ SOLN
INTRAMUSCULAR | Status: AC
  Filled 2011-10-26: qty 2

## 2011-10-26 MED ORDER — GLYCINE 1.5 % IR SOLN
Status: DC | PRN
  Administered 2011-10-26 (×14): 3000 mL

## 2011-10-26 MED ORDER — ONDANSETRON HCL 4 MG/2ML IJ SOLN: 4.0000 mg | Freq: Once | INTRAMUSCULAR | Status: DC | PRN

## 2011-10-26 MED ORDER — FENTANYL CITRATE 0.05 MG/ML IJ SOLN: 25.0000 ug | INTRAMUSCULAR | Status: DC | PRN

## 2011-10-26 MED ORDER — MIDAZOLAM HCL 2 MG/2ML IJ SOLN
1.0000 mg | INTRAMUSCULAR | Status: DC | PRN
  Administered 2011-10-26 (×2): 2 mg via INTRAVENOUS

## 2011-10-26 MED ORDER — HYDROMORPHONE HCL PF 1 MG/ML IJ SOLN
1.0000 mg | INTRAMUSCULAR | Status: DC | PRN
  Administered 2011-10-26 – 2011-10-27 (×5): 1 mg via INTRAVENOUS
  Filled 2011-10-26 (×5): qty 1

## 2011-10-26 MED ORDER — STERILE WATER FOR IRRIGATION IR SOLN
Status: DC | PRN
  Administered 2011-10-26: 1000 mL

## 2011-10-26 MED ORDER — MIDAZOLAM HCL 5 MG/5ML IJ SOLN
INTRAMUSCULAR | Status: DC | PRN
  Administered 2011-10-26: 2 mg via INTRAVENOUS

## 2011-10-26 MED ORDER — LACTATED RINGERS IV SOLN
INTRAVENOUS | Status: DC
  Administered 2011-10-26 (×2): 1000 mL via INTRAVENOUS

## 2011-10-26 MED ORDER — BUPIVACAINE IN DEXTROSE 0.75-8.25 % IT SOLN
INTRATHECAL | Status: DC | PRN
  Administered 2011-10-26: 15 mg via INTRATHECAL

## 2011-10-26 MED ORDER — CIPROFLOXACIN IN D5W 200 MG/100ML IV SOLN
INTRAVENOUS | Status: AC
  Filled 2011-10-26: qty 100

## 2011-10-26 MED ORDER — FENTANYL CITRATE 0.05 MG/ML IJ SOLN
INTRAMUSCULAR | Status: DC | PRN
  Administered 2011-10-26: 25 ug via INTRAVENOUS

## 2011-10-26 MED ORDER — EPHEDRINE SULFATE 50 MG/ML IJ SOLN
INTRAMUSCULAR | Status: DC | PRN
  Administered 2011-10-26: 15 mg via INTRAVENOUS
  Administered 2011-10-26 (×2): 5 mg via INTRAVENOUS

## 2011-10-26 MED ORDER — PROPOFOL 10 MG/ML IV EMUL
INTRAVENOUS | Status: AC
  Filled 2011-10-26: qty 20

## 2011-10-26 MED ORDER — FENTANYL CITRATE 0.05 MG/ML IJ SOLN
INTRAMUSCULAR | Status: AC
  Filled 2011-10-26: qty 2

## 2011-10-26 MED ORDER — CIPROFLOXACIN IN D5W 400 MG/200ML IV SOLN
INTRAVENOUS | Status: DC | PRN
  Administered 2011-10-26: 200 mg via INTRAVENOUS

## 2011-10-26 MED ORDER — PROPOFOL 10 MG/ML IV EMUL
INTRAVENOUS | Status: DC | PRN
  Administered 2011-10-26: 14:00:00 via INTRAVENOUS
  Administered 2011-10-26: 50 ug/kg/min via INTRAVENOUS

## 2011-10-26 MED ORDER — BUPIVACAINE IN DEXTROSE 0.75-8.25 % IT SOLN
INTRATHECAL | Status: AC
  Filled 2011-10-26: qty 2

## 2011-10-26 MED ORDER — CIPROFLOXACIN IN D5W 200 MG/100ML IV SOLN: 200.0000 mg | Freq: Once | INTRAVENOUS | Status: DC

## 2011-10-26 MED ORDER — DEXTROSE-NACL 5-0.45 % IV SOLN
INTRAVENOUS | Status: DC
  Administered 2011-10-26 – 2011-10-27 (×2): via INTRAVENOUS

## 2011-10-26 MED ORDER — EPHEDRINE SULFATE 50 MG/ML IJ SOLN
INTRAMUSCULAR | Status: AC
  Filled 2011-10-26: qty 1

## 2011-10-26 SURGICAL SUPPLY — 42 items
BAG DECANTER FOR FLEXI CONT (MISCELLANEOUS) ×2 IMPLANT
BAG DRAIN URO TABLE W/ADPT NS (DRAPE) ×2 IMPLANT
BAG DRN 8 ADPR NS SKTRN CSTL (DRAPE) ×1
BAG DRN URN TUBE DRIP CHMBR (OSTOMY) ×1
BAG URINE DRAIN TURP 4L (OSTOMY) ×2 IMPLANT
BAG URINE DRAINAGE (UROLOGICAL SUPPLIES) ×2 IMPLANT
CABLE HI FREQUENCY MONOPOLAR (ELECTROSURGICAL) ×2 IMPLANT
CATH 3WAY 30CC 24FR (CATHETERS) ×1 IMPLANT
CATH FOLEY 2WAY SLVR  5CC 20FR (CATHETERS)
CATH FOLEY 2WAY SLVR 5CC 20FR (CATHETERS) ×1 IMPLANT
CATH FOLEY 3WAY 30CC 22F (CATHETERS) ×2 IMPLANT
CLOTH BEACON ORANGE TIMEOUT ST (SAFETY) ×2 IMPLANT
CONNECTOR 5 IN 1 STRAIGHT STRL (MISCELLANEOUS) ×2 IMPLANT
DRAPE STERI URO 23X35 APER SZ5 (DRAPE) ×2 IMPLANT
ELECT CUT LOOP C-MAX 27FR .012 (CUTTING LOOP) ×2
ELECT REM PT RETURN 9FT ADLT (ELECTROSURGICAL) ×2
ELECTRODE CUT LP CMX 27FR .012 (CUTTING LOOP) ×1 IMPLANT
ELECTRODE REM PT RTRN 9FT ADLT (ELECTROSURGICAL) ×1 IMPLANT
FLOOR PAD 36X40 (MISCELLANEOUS)
FORMALIN 10 PREFIL 120ML (MISCELLANEOUS) IMPLANT
FORMALIN 10 PREFIL 480ML (MISCELLANEOUS) ×2 IMPLANT
GLOVE BIO SURGEON STRL SZ7 (GLOVE) ×2 IMPLANT
GLOVE ECLIPSE 7.5 STRL STRAW (GLOVE) ×1 IMPLANT
GLOVE INDICATOR 7.0 STRL GRN (GLOVE) ×3 IMPLANT
GLOVE SS BIOGEL STRL SZ 6.5 (GLOVE) IMPLANT
GLOVE SUPERSENSE BIOGEL SZ 6.5 (GLOVE) ×1
GLYCINE 1.5% IRRIG UROMATIC (IV SOLUTION) ×18 IMPLANT
GOWN STRL REIN XL XLG (GOWN DISPOSABLE) ×2 IMPLANT
IV NS IRRIG 3000ML ARTHROMATIC (IV SOLUTION) ×2 IMPLANT
KIT ROOM TURNOVER AP CYSTO (KITS) ×2 IMPLANT
MANIFOLD NEPTUNE II (INSTRUMENTS) ×2 IMPLANT
PACK CYSTO (CUSTOM PROCEDURE TRAY) ×2 IMPLANT
PAD ARMBOARD 7.5X6 YLW CONV (MISCELLANEOUS) ×2 IMPLANT
PAD FLOOR 36X40 (MISCELLANEOUS) ×1 IMPLANT
PAD TELFA 3X4 1S STER (GAUZE/BANDAGES/DRESSINGS) ×1 IMPLANT
SET IRRIGATING DISP (SET/KITS/TRAYS/PACK) ×2 IMPLANT
SYR 30ML LL (SYRINGE) ×2 IMPLANT
SYRINGE IRR TOOMEY STRL 70CC (SYRINGE) ×1 IMPLANT
TOWEL OR 17X26 4PK STRL BLUE (TOWEL DISPOSABLE) ×2 IMPLANT
WATER STERILE IRR 1000ML POUR (IV SOLUTION) ×2 IMPLANT
XPEEDA 550 SIDEFIRING FIBER (MISCELLANEOUS) IMPLANT
YANKAUER SUCT BULB TIP 10FT TU (MISCELLANEOUS) ×2 IMPLANT

## 2011-10-26 NOTE — Anesthesia Procedure Notes (Signed)
Spinal  Patient location during procedure: OR Start time: 10/26/2011 1:22 PM Staffing CRNA/Resident: Saylee Sherrill J Preanesthetic Checklist Completed: patient identified, site marked, surgical consent, pre-op evaluation, timeout performed, IV checked, risks and benefits discussed and monitors and equipment checked Spinal Block Patient position: right lateral decubitus Prep: Betadine Patient monitoring: heart rate, cardiac monitor, continuous pulse ox and blood pressure Approach: right paramedian Location: L2-3 Injection technique: single-shot Needle Needle type: Spinocan  Needle gauge: 22 G Assessment Sensory level: T8 Additional Notes CSF clear and brisk; [id number]    08/2012

## 2011-10-26 NOTE — Transfer of Care (Signed)
Immediate Anesthesia Transfer of Care Note  Patient: [name]  Procedure(s) Performed: Procedure(s) (LRB): TRANSURETHRAL RESECTION OF BLADDER TUMOR (TURBT) (N/A) TRANSURETHRAL RESECTION OF THE PROSTATE (TURP) (N/A)  Patient Location: PACU  Anesthesia Type: SAB  Level of Consciousness: awake  Airway & Oxygen Therapy: Patient Spontanous Breathing   Post-op Assessment: Report given to PACU RN, Post -op Vital signs reviewed and stable. SAB Level  T 10  Post vital signs: Reviewed and stable  Complications: No apparent anesthesia complications

## 2011-10-26 NOTE — Anesthesia Postprocedure Evaluation (Signed)
Anesthesia Post Note  Patient: [name]  Procedure(s) Performed: Procedure(s) (LRB): TRANSURETHRAL RESECTION OF BLADDER TUMOR (TURBT) (N/A) TRANSURETHRAL RESECTION OF THE PROSTATE (TURP) (N/A)  Anesthesia type: Spinal  Patient location: PACU  Post pain: Pain level controlled  Post assessment: Post-op Vital signs reviewed, Patient's Cardiovascular Status Stable, Respiratory Function Stable, Patent Airway, No signs of Nausea or vomiting and Pain level controlled  Last Vitals:  Filed Vitals:   10/26/11 1506  BP: 97/56  Pulse: 64  Temp: 36.3 C  Resp: 12    Post vital signs: Reviewed and stable  Level of consciousness: awake and alert   Complications: No apparent anesthesia complications

## 2011-10-26 NOTE — Progress Notes (Signed)
No change in H&P on reexamination. 

## 2011-10-26 NOTE — Brief Op Note (Signed)
10/26/2011  2:56 PM  PATIENT:  Daylene Posey  56 y.o. male  PRE-OPERATIVE DIAGNOSIS:  bladder tumor, bph  POST-OPERATIVE DIAGNOSIS:  bladder tumor, bph  PROCEDURE:  Procedure(s) (LRB): TRANSURETHRAL RESECTION OF BLADDER TUMOR (TURBT) (N/A) TRANSURETHRAL RESECTION OF THE PROSTATE (TURP) (N/A)  SURGEON:  Surgeon(s) and Role:    * Ky Barban, MD - Primary  PHYSICIAN ASSISTANT:   ASSISTANTS: none   ANESTHESIA:   spinal  EBL:  Total I/O In: 1250 [I.V.:1250] Out: -   BLOOD ADMINISTERED:none  DRAINS: Urinary Catheter (Foley)   LOCAL MEDICATIONS USED:  NONE  SPECIMEN:  Source of Specimen:  bladder tumor and protate chips  DISPOSITION OF SPECIMEN:  PATHOLOGY  COUNTS:  YES  TOURNIQUET:  * No tourniquets in log *  DICTATION: .Other Dictation: Dictation Number dictation [id number]  PLAN OF CARE: Admit for overnight observation  PATIENT DISPOSITION:  PACU - hemodynamically stable.   Delay start of Pharmacological VTE agent (>24hrs) due to surgical blood loss or risk of bleeding:

## 2011-10-26 NOTE — Anesthesia Preprocedure Evaluation (Signed)
Anesthesia Evaluation  Patient identified by MRN, date of birth, ID band Patient awake    Reviewed: Allergy & Precautions, H&P , NPO status , Patient's Chart, lab work & pertinent test results  History of Anesthesia Complications Negative for: history of anesthetic complications  Airway Mallampati: II      Dental  (+) Teeth Intact   Pulmonary neg pulmonary ROS, former smoker,  breath sounds clear to auscultation        Cardiovascular negative cardio ROS  Rhythm:Regular     Neuro/Psych    GI/Hepatic negative GI ROS,   Endo/Other    Renal/GU      Musculoskeletal   Abdominal   Peds  Hematology   Anesthesia Other Findings   Reproductive/Obstetrics                           Anesthesia Physical Anesthesia Plan  ASA: I  Anesthesia Plan: Spinal   Post-op Pain Management:    Induction:   Airway Management Planned: Nasal Cannula  Additional Equipment:   Intra-op Plan:   Post-operative Plan:   Informed Consent: I have reviewed the patients History and Physical, chart, labs and discussed the procedure including the risks, benefits and alternatives for the proposed anesthesia with the patient or authorized representative who has indicated his/her understanding and acceptance.     Plan Discussed with:   Anesthesia Plan Comments:         Anesthesia Quick Evaluation

## 2011-10-27 LAB — CBC
MCH: 27.3 pg (ref 26.0–34.0)
Platelets: 198 10*3/uL (ref 150–400)
RBC: 5.45 MIL/uL (ref 4.22–5.81)

## 2011-10-27 LAB — BASIC METABOLIC PANEL
CO2: 29 mEq/L (ref 19–32)
Calcium: 9 mg/dL (ref 8.4–10.5)
GFR calc non Af Amer: 81 mL/min — ABNORMAL LOW (ref >90)
Potassium: 3.9 mEq/L (ref 3.5–5.1)
Sodium: 139 mEq/L (ref 135–145)

## 2011-10-27 MED ORDER — OXYCODONE-ACETAMINOPHEN 7.5-325 MG PO TABS: 1.0000 | ORAL_TABLET | Freq: Four times a day (QID) | ORAL | Status: AC | PRN

## 2011-10-27 NOTE — Anesthesia Postprocedure Evaluation (Signed)
Anesthesia Post Note  Patient: [name]  Procedure(s) Performed: Procedure(s) (LRB): TRANSURETHRAL RESECTION OF BLADDER TUMOR (TURBT) (N/A) TRANSURETHRAL RESECTION OF THE PROSTATE (TURP) (N/A)  Anesthesia type: Spinal  Patient location: 312  Post pain: Pain level controlled  Post assessment: Post-op Vital signs reviewed, Patient's Cardiovascular Status Stable, Respiratory Function Stable, Patent Airway, No signs of Nausea or vomiting and Pain level controlled  Last Vitals:  Filed Vitals:   10/27/11 0535  BP: 103/63  Pulse: 63  Temp: 36.6 C  Resp: 20    Post vital signs: Reviewed and stable  Level of consciousness: awake and alert   Complications: No apparent anesthesia complications

## 2011-10-27 NOTE — Op Note (Signed)
NAMERAMAR, NOBREGA NO.:  [id number]  MEDICAL RECORD NO.:  [id number]  LOCATION:  A312                          FACILITY:  [facility]  PHYSICIAN:  Ky Barban, M.D.DATE OF BIRTH:  03-21-1955  DATE OF PROCEDURE: DATE OF DISCHARGE:                              OPERATIVE REPORT   PREOPERATIVE DIAGNOSES:  Bladder tumor and benign prostatic hypertrophy.  POSTOPERATIVE DIAGNOSES:  Bladder tumor invading the prostatic urethra.  PROCEDURE:  The patient was given spinal anesthesia in lithotomy position.  After usual prep and drape, a #28 Iglesias resectoscope was introduced into the bladder.  It was inspected.  There was extensive tumor in the bladder and it was growing into the bladder neck.  There was no distance between the prostate and bladder neck and the tumor.  I did not appreciate that at the time of cystoscopy and under anesthesia, I could see the tumor was growing into the prostatic urethra.  It may be invading the prostate.  There was a complete bladder neck obstruction. I proceeded to resect the prostatic urethra.  The tumor which was visually seen in the prostatic urethra along with the lateral lobes were completely resected.  Prostatic urethra was open, then I resected some of the tumor around the bladder neck and the right and left bladder wall, but there was still extensive tumor located on the bladder.  I do not think it is wise to keep resecting it, I want to see what grade it is because most likely the patient might need to have a cystectomy and I did resect his prostatic urethra, so that he can urinate properly because he may not want to have cystectomy, may want to have radiation or chemotherapy, but after resecting the tumor in the bladder some of the area I resected deep so that I can get deep down to the muscles. Some of the tumor was simply fulgurated.  Chips were evacuated. Bleeders were coagulated.  Resectoscope was removed.  24 Foley  catheter left in for drainage.  CBI started, which is clear.  The patient left the operating room in satisfactory condition.     Ky Barban, M.D.     MIJ/MEDQ  D:  10/26/2011  T:  10/27/2011  Job:  [id number]

## 2011-10-27 NOTE — Progress Notes (Signed)
UR chart review completed.  

## 2011-10-27 NOTE — Care Management Note (Unsigned)
    Page 1 of 1   10/27/2011     2:56:27 PM   CARE MANAGEMENT NOTE 10/27/2011  Patient:  [name], [name]   Account Number:  [id number]  Date Initiated:  10/27/2011  Documentation initiated by:  Sharrie Rothman  Subjective/Objective Assessment:   Pt admitted from home s/p TURP. Pt lives with wife and will return home at discharge. Pt is being discharged home with foley catheter.     Action/Plan:   CM offered HH but pt denies needing their services at this time.   Anticipated DC Date:  10/27/2011   Anticipated DC Plan:  HOME/SELF CARE      DC Planning Services  CM consult      Choice offered to / List presented to:             Status of service:  Completed, signed off Medicare Important Message given?   (If response is "NO", the following Medicare IM given date fields will be blank) Date Medicare IM given:   Date Additional Medicare IM given:    Discharge Disposition:  HOME/SELF CARE  Per UR Regulation:    If discussed at Long Length of Stay Meetings, dates discussed:    Comments:  10/27/11 1455 Arlyss Queen, RN BSN CM

## 2011-10-27 NOTE — Addendum Note (Signed)
Addendum  created 10/27/11 0843 by Franco Nones, CRNA   Modules edited:Notes Section

## 2011-10-27 NOTE — Progress Notes (Signed)
Dr. Jerre Simon called to check on patient. Told him CBI was red in color after turning down flow. MD stated to stop CBI and ambulate patient. MD to be in after lunch.

## 2011-10-27 NOTE — Progress Notes (Signed)
Discharge instructions given with no unanswered questions. Return demonstration done by patient changing from leg bag to larger drainage bag. Left facility with wife in stable condition

## 2011-11-02 ENCOUNTER — Encounter (HOSPITAL_COMMUNITY): Payer: Self-pay | Admitting: Urology

## 2012-01-07 ENCOUNTER — Emergency Department (HOSPITAL_COMMUNITY): Payer: Medicaid Other

## 2012-01-07 ENCOUNTER — Observation Stay (HOSPITAL_COMMUNITY)
Admission: EM | Admit: 2012-01-07 | Discharge: 2012-01-07 | Payer: Medicaid Other | Attending: Internal Medicine | Admitting: Internal Medicine

## 2012-01-07 ENCOUNTER — Encounter (HOSPITAL_COMMUNITY): Payer: Self-pay | Admitting: Internal Medicine

## 2012-01-07 DIAGNOSIS — J438 Other emphysema: Secondary | ICD-10-CM | POA: Insufficient documentation

## 2012-01-07 DIAGNOSIS — R3129 Other microscopic hematuria: Secondary | ICD-10-CM | POA: Diagnosis present

## 2012-01-07 DIAGNOSIS — R079 Chest pain, unspecified: Secondary | ICD-10-CM | POA: Insufficient documentation

## 2012-01-07 DIAGNOSIS — C679 Malignant neoplasm of bladder, unspecified: Principal | ICD-10-CM | POA: Insufficient documentation

## 2012-01-07 DIAGNOSIS — N179 Acute kidney failure, unspecified: Secondary | ICD-10-CM | POA: Insufficient documentation

## 2012-01-07 DIAGNOSIS — R509 Fever, unspecified: Secondary | ICD-10-CM | POA: Diagnosis present

## 2012-01-07 DIAGNOSIS — N390 Urinary tract infection, site not specified: Secondary | ICD-10-CM | POA: Diagnosis present

## 2012-01-07 DIAGNOSIS — R0602 Shortness of breath: Secondary | ICD-10-CM | POA: Insufficient documentation

## 2012-01-07 DIAGNOSIS — N1330 Unspecified hydronephrosis: Secondary | ICD-10-CM | POA: Diagnosis present

## 2012-01-07 DIAGNOSIS — R0600 Dyspnea, unspecified: Secondary | ICD-10-CM | POA: Diagnosis present

## 2012-01-07 DIAGNOSIS — R161 Splenomegaly, not elsewhere classified: Secondary | ICD-10-CM | POA: Insufficient documentation

## 2012-01-07 DIAGNOSIS — D500 Iron deficiency anemia secondary to blood loss (chronic): Secondary | ICD-10-CM | POA: Diagnosis present

## 2012-01-07 DIAGNOSIS — R5082 Postprocedural fever: Secondary | ICD-10-CM | POA: Insufficient documentation

## 2012-01-07 DIAGNOSIS — T8182XA Emphysema (subcutaneous) resulting from a procedure, initial encounter: Secondary | ICD-10-CM

## 2012-01-07 DIAGNOSIS — N289 Disorder of kidney and ureter, unspecified: Secondary | ICD-10-CM

## 2012-01-07 DIAGNOSIS — D62 Acute posthemorrhagic anemia: Secondary | ICD-10-CM | POA: Insufficient documentation

## 2012-01-07 DIAGNOSIS — N10 Acute tubulo-interstitial nephritis: Secondary | ICD-10-CM | POA: Insufficient documentation

## 2012-01-07 DIAGNOSIS — K7460 Unspecified cirrhosis of liver: Secondary | ICD-10-CM | POA: Diagnosis present

## 2012-01-07 DIAGNOSIS — R1013 Epigastric pain: Secondary | ICD-10-CM | POA: Diagnosis present

## 2012-01-07 DIAGNOSIS — R310 Gross hematuria: Secondary | ICD-10-CM | POA: Diagnosis present

## 2012-01-07 DIAGNOSIS — R8281 Pyuria: Secondary | ICD-10-CM | POA: Diagnosis present

## 2012-01-07 HISTORY — DX: Malignant neoplasm of bladder, unspecified: C67.9

## 2012-01-07 HISTORY — DX: Emphysema (subcutaneous) resulting from a procedure, initial encounter: T81.82XA

## 2012-01-07 HISTORY — DX: Splenomegaly, not elsewhere classified: R16.1

## 2012-01-07 HISTORY — DX: Unspecified cirrhosis of liver: K74.60

## 2012-01-07 HISTORY — DX: Acute pyelonephritis: N10

## 2012-01-07 LAB — URINALYSIS, ROUTINE W REFLEX MICROSCOPIC
Glucose, UA: NEGATIVE mg/dL
Specific Gravity, Urine: 1.01 (ref 1.005–1.030)
pH: 7 (ref 5.0–8.0)

## 2012-01-07 LAB — CBC WITH DIFFERENTIAL/PLATELET
Basophils Absolute: 0 10*3/uL (ref 0.0–0.1)
Basophils Relative: 0 % (ref 0–1)
Eosinophils Absolute: 0.3 10*3/uL (ref 0.0–0.7)
HCT: 34.4 % — ABNORMAL LOW (ref 39.0–52.0)
MCH: 26.9 pg (ref 26.0–34.0)
MCHC: 33.4 g/dL (ref 30.0–36.0)
Monocytes Absolute: 1.4 10*3/uL — ABNORMAL HIGH (ref 0.1–1.0)
Neutro Abs: 12.6 10*3/uL — ABNORMAL HIGH (ref 1.7–7.7)
Neutrophils Relative %: 83 % — ABNORMAL HIGH (ref 43–77)
RDW: 14.7 % (ref 11.5–15.5)

## 2012-01-07 LAB — URINE MICROSCOPIC-ADD ON

## 2012-01-07 LAB — HEPATIC FUNCTION PANEL
Albumin: 2.3 g/dL — ABNORMAL LOW (ref 3.5–5.2)
Alkaline Phosphatase: 126 U/L — ABNORMAL HIGH (ref 39–117)
Bilirubin, Direct: 0.2 mg/dL (ref 0.0–0.3)
Indirect Bilirubin: 0.4 mg/dL (ref 0.3–0.9)
Total Bilirubin: 0.6 mg/dL (ref 0.3–1.2)

## 2012-01-07 LAB — COMPREHENSIVE METABOLIC PANEL
AST: 43 U/L — ABNORMAL HIGH (ref 0–37)
Albumin: 2.3 g/dL — ABNORMAL LOW (ref 3.5–5.2)
BUN: 24 mg/dL — ABNORMAL HIGH (ref 6–23)
Chloride: 103 mEq/L (ref 96–112)
Creatinine, Ser: 1.36 mg/dL — ABNORMAL HIGH (ref 0.50–1.35)
Total Bilirubin: 0.6 mg/dL (ref 0.3–1.2)
Total Protein: 5.6 g/dL — ABNORMAL LOW (ref 6.0–8.3)

## 2012-01-07 LAB — TROPONIN I: Troponin I: <0.3 ng/mL (ref <0.30)

## 2012-01-07 MED ORDER — ONDANSETRON HCL 4 MG/2ML IJ SOLN
4.0000 mg | Freq: Once | INTRAMUSCULAR | Status: AC
  Administered 2012-01-07: 4 mg via INTRAVENOUS
  Filled 2012-01-07: qty 2

## 2012-01-07 MED ORDER — VANCOMYCIN HCL 1000 MG IV SOLR
1250.0000 mg | Freq: Two times a day (BID) | INTRAVENOUS | Status: DC
  Filled 2012-01-07 (×6): qty 1250

## 2012-01-07 MED ORDER — ZOLPIDEM TARTRATE 5 MG PO TABS: 5.0000 mg | ORAL_TABLET | Freq: Every evening | ORAL | Status: DC | PRN

## 2012-01-07 MED ORDER — ONDANSETRON HCL 4 MG PO TABS: 4.0000 mg | ORAL_TABLET | Freq: Four times a day (QID) | ORAL | Status: DC | PRN

## 2012-01-07 MED ORDER — PANTOPRAZOLE SODIUM 40 MG PO TBEC: 40.0000 mg | DELAYED_RELEASE_TABLET | Freq: Every day | ORAL | Status: DC

## 2012-01-07 MED ORDER — ACETAMINOPHEN 325 MG PO TABS
650.0000 mg | ORAL_TABLET | Freq: Four times a day (QID) | ORAL | Status: DC | PRN
  Administered 2012-01-07: 650 mg via ORAL
  Filled 2012-01-07: qty 2

## 2012-01-07 MED ORDER — ACETAMINOPHEN 650 MG RE SUPP: 650.0000 mg | Freq: Four times a day (QID) | RECTAL | Status: DC | PRN

## 2012-01-07 MED ORDER — PIPERACILLIN-TAZOBACTAM 3.375 G IVPB
3.3750 g | Freq: Three times a day (TID) | INTRAVENOUS | Status: DC
  Administered 2012-01-07: 3.375 g via INTRAVENOUS
  Filled 2012-01-07 (×11): qty 50

## 2012-01-07 MED ORDER — ALUM & MAG HYDROXIDE-SIMETH 200-200-20 MG/5ML PO SUSP: 30.0000 mL | Freq: Four times a day (QID) | ORAL | Status: DC | PRN

## 2012-01-07 MED ORDER — DOCUSATE SODIUM 100 MG PO CAPS
100.0000 mg | ORAL_CAPSULE | Freq: Two times a day (BID) | ORAL | Status: DC
  Administered 2012-01-07: 100 mg via ORAL
  Filled 2012-01-07: qty 1

## 2012-01-07 MED ORDER — SODIUM CHLORIDE 0.9 % IJ SOLN
INTRAMUSCULAR | Status: AC
  Administered 2012-01-07: 3 mL
  Filled 2012-01-07: qty 3

## 2012-01-07 MED ORDER — PIPERACILLIN-TAZOBACTAM 3.375 G IVPB
3.3750 g | Freq: Three times a day (TID) | INTRAVENOUS | Status: DC
  Filled 2012-01-07 (×9): qty 50

## 2012-01-07 MED ORDER — MORPHINE SULFATE 4 MG/ML IJ SOLN
4.0000 mg | Freq: Once | INTRAMUSCULAR | Status: AC
  Administered 2012-01-07: 4 mg via INTRAVENOUS
  Filled 2012-01-07: qty 1

## 2012-01-07 MED ORDER — PANTOPRAZOLE SODIUM 40 MG PO TBEC
40.0000 mg | DELAYED_RELEASE_TABLET | Freq: Every day | ORAL | Status: DC
  Administered 2012-01-07: 40 mg via ORAL
  Filled 2012-01-07: qty 1

## 2012-01-07 MED ORDER — PIPERACILLIN-TAZOBACTAM 3.375 G IVPB: 3.3750 g | Freq: Once | INTRAVENOUS | Status: DC

## 2012-01-07 MED ORDER — SODIUM CHLORIDE 0.9 % IV BOLUS (SEPSIS)
1000.0000 mL | Freq: Once | INTRAVENOUS | Status: AC
  Administered 2012-01-07: 1000 mL via INTRAVENOUS

## 2012-01-07 MED ORDER — ENOXAPARIN SODIUM 40 MG/0.4ML ~~LOC~~ SOLN: 40.0000 mg | SUBCUTANEOUS | Status: DC

## 2012-01-07 MED ORDER — SODIUM CHLORIDE 0.9 % IJ SOLN
3.0000 mL | Freq: Two times a day (BID) | INTRAMUSCULAR | Status: DC
  Administered 2012-01-07: 3 mL via INTRAVENOUS
  Filled 2012-01-07: qty 3

## 2012-01-07 MED ORDER — VANCOMYCIN HCL 1000 MG IV SOLR: 1250.0000 mg | Freq: Two times a day (BID) | INTRAVENOUS | Status: DC

## 2012-01-07 MED ORDER — SODIUM CHLORIDE 0.9 % IV SOLN: INTRAVENOUS | Status: DC

## 2012-01-07 MED ORDER — POLYETHYLENE GLYCOL 3350 17 G PO PACK: 17.0000 g | PACK | Freq: Every day | ORAL | Status: DC | PRN

## 2012-01-07 MED ORDER — ACETAMINOPHEN 325 MG PO TABS: 650.0000 mg | ORAL_TABLET | Freq: Four times a day (QID) | ORAL | Status: DC | PRN

## 2012-01-07 MED ORDER — ASPIRIN EC 81 MG PO TBEC
81.0000 mg | DELAYED_RELEASE_TABLET | Freq: Every day | ORAL | Status: DC
Start: 2012-01-07 — End: 2012-01-07
  Administered 2012-01-07: 81 mg via ORAL
  Filled 2012-01-07: qty 1

## 2012-01-07 MED ORDER — ENOXAPARIN SODIUM 40 MG/0.4ML ~~LOC~~ SOLN
40.0000 mg | SUBCUTANEOUS | Status: DC
  Administered 2012-01-07: 40 mg via SUBCUTANEOUS
  Filled 2012-01-07: qty 0.4

## 2012-01-07 MED ORDER — VANCOMYCIN HCL IN DEXTROSE 1-5 GM/200ML-% IV SOLN
1000.0000 mg | Freq: Once | INTRAVENOUS | Status: AC
  Administered 2012-01-07: 1000 mg via INTRAVENOUS
  Filled 2012-01-07: qty 200

## 2012-01-07 MED ORDER — ACETAMINOPHEN 325 MG PO TABS
650.0000 mg | ORAL_TABLET | Freq: Once | ORAL | Status: AC
  Administered 2012-01-07: 650 mg via ORAL
  Filled 2012-01-07: qty 2

## 2012-01-07 MED ORDER — ONDANSETRON HCL 4 MG/2ML IJ SOLN: 4.0000 mg | Freq: Four times a day (QID) | INTRAMUSCULAR | Status: DC | PRN

## 2012-01-07 MED ORDER — HYDROCODONE-ACETAMINOPHEN 5-325 MG PO TABS
1.0000 | ORAL_TABLET | ORAL | Status: DC | PRN
  Administered 2012-01-07 (×2): 1 via ORAL
  Filled 2012-01-07 (×2): qty 1

## 2012-01-07 MED ORDER — PIPERACILLIN-TAZOBACTAM 3.375 G IVPB: 3.3750 g | Freq: Three times a day (TID) | INTRAVENOUS | Status: DC

## 2012-01-07 NOTE — Progress Notes (Signed)
01/07/12 1930 Patient transferred to [facility] this evening, but received call from lab with blood cultures x 2 sets preliminary results growing gram negative rods to both sets after patient had left. Text paged Dr Sherrie Mustache to notify and notified RN caring for patient at [facility]. Requested lab fax report when available if possible, will notify lab of their request. Earnstine Regal, RN

## 2012-01-07 NOTE — H&P (Signed)
[facility] History and Physical  [name] ZOX:[id number] DOB: [birth year] DOA: 01/07/2012  Referring physician: EDP Rancour PCP: L. Fusco Specialists: [facility], Urology  Chief Complaint: Fever, Dyspnea  HPI: [name] is a 56 y.o. male with transitional cell carcinoma of the bladder who recently underwent bladder resection including prostate and lymph removal with formation of ileal bladder conduit. He was discharged from [facility] on 10/14. He has done well after surgery except for this afternoon when his home health nurse evaluated him he had a fever of 101.4 associated with rigors, drenching nite sweats, flank pain, shortness of breath and acute onset epigastric pain which he said was like "bad indigestion".  Hospitalist asked to admit for post-op fever work-up and empiric IV antibiotics.  Review of Systems: Review of Systems  Constitutional: Positive for fever, chills, weight loss, malaise/fatigue and diaphoresis.  Eyes: Negative.  Negative for blurred vision and double vision.  Respiratory: Positive for shortness of breath. Negative for cough, hemoptysis and wheezing.   Cardiovascular: Positive for chest pain.       Epigastric pain/"indigestion"  Gastrointestinal: Positive for heartburn and abdominal pain. Negative for nausea and vomiting.  Genitourinary: Positive for flank pain. Negative for dysuria, urgency, frequency and hematuria.  Musculoskeletal: Positive for back pain and joint pain.  Skin: Negative for itching and rash.  Neurological: Positive for weakness and headaches.  Endo/Heme/Allergies: Negative.   Psychiatric/Behavioral: Negative.   All other systems reviewed and are negative.     Past Medical History  Diagnosis Date  . Gross hematuria   . No pertinent past medical history   . C. difficile enteritis 07/04/2011  . Diverticulitis    Past Surgical History  Procedure Date  . Elbow surgery   . Transurethral resection of bladder tumor 10/26/2011   Procedure: TRANSURETHRAL RESECTION OF BLADDER TUMOR (TURBT);  Surgeon: Ky Barban, MD;  Location: [facility];  Service: Urology;  Laterality: N/A;  . Transurethral resection of prostate 10/26/2011    Procedure: TRANSURETHRAL RESECTION OF THE PROSTATE (TURP);  Surgeon: Ky Barban, MD;  Location: [facility];  Service: Urology;  Laterality: N/A;   Social History:  reports that he quit smoking about 4 years ago. He does not have any smokeless tobacco history on file. He reports that he does not drink alcohol or use illicit drugs.   No Known Allergies  Family History  Problem Relation Age of Onset  . Heart failure Father   . Hypertension Mother     Prior to Admission medications   Medication Sig Start Date End Date Taking? Authorizing Provider  docusate sodium (COLACE) 100 MG capsule Take 100 mg by mouth 2 (two) times daily.   Yes Historical Provider, MD  HYDROcodone-acetaminophen (NORCO/VICODIN) 5-325 MG per tablet Take 1 tablet by mouth every 6 (six) hours as needed.   Yes Historical Provider, MD  sennosides-docusate sodium (SENOKOT-S) 8.6-50 MG tablet Take 1 tablet by mouth daily.   Yes Historical Provider, MD   Physical Exam: Filed Vitals:   01/07/12 0344 01/07/12 0529 01/07/12 0540  BP: 107/69  116/69  Pulse: 99  73  Temp: 99.6 F (37.6 C) 100.2 F (37.9 C)   TempSrc: Oral Rectal   Resp: 22  20  SpO2: 93%  95%     General:  Pleasant cooperative, very ruddy complexion v. erythroderma, well nourished, not toxic appearing  Eyes: non-icteric  ENT: MMM, poor dentition  Neck: supple, no denopathy  Cardiovascular: RRR, no mrg  Respiratory: CTAB  Abdomen: well healed  surgical insicion, ostomy looks healthy, stent protruding from os, urine in bag looks light yellow and clear, non-tender  Skin: no rashes  Musculoskeletal: normal  Psychiatric: normal  Neurologic: non-focal  Labs on Admission:  Basic Metabolic Panel:  Lab 01/07/12 3086  NA 135  K 3.5  CL 103    CO2 23  GLUCOSE 129*  BUN 24*  CREATININE 1.36*  CALCIUM 8.1*  MG --  PHOS --   Liver Function Tests:  Lab 01/07/12 0407  AST 43*  ALT 36  ALKPHOS 125*  BILITOT 0.6  PROT 5.6*  ALBUMIN 2.3*   No results found for this basename: LIPASE:5,AMYLASE:5 in the last 168 hours No results found for this basename: AMMONIA:5 in the last 168 hours CBC:  Lab 01/07/12 0407  WBC 15.0*  NEUTROABS 12.6*  HGB 11.5*  HCT 34.4*  MCV 80.4  PLT 323   Cardiac Enzymes:  Lab 01/07/12 0515  CKTOTAL --  CKMB --  CKMBINDEX --  TROPONINI <0.30    EKG: Independently reviewed.   Assessment: Active Problems:  Fever  Bladder cancer  Pyuria  Dyspnea  Epigastric pain  Fever associated with a broad range of constitutional symptoms in setting of known malignancy, post-op 3 weeks with "bad indigestion"/epigastric pain and acute SOB requiring nasal cannula O2 sating at 93% on @l  with no lung disease or smoking hx is HIGHLY concerning for PE. Large differential needs to be considered, urine with WBC, but he has new conduit so may not be abnormal. Infection is also high on diff list, but wound looks great and no pain on abdominal exam, does have worsening back/flank pain.  Plan:  ED work-up still in process at time of admission  Chest CT to r/o PE  Abdominal CT to r/o intrabdominal abscess or post-op complication  Blood cx, await urine cx results  Empiric abx with vanc, zosyn  Cycle CE to rule out occult ACS and check 12-Lead  Code Status: Full Code Family Communication: Discussed plan with patient and his wife Disposition Plan: Anticipate home 1-2 days pending work-up results, home health following for ostomy needs  Time spent: 25  [facility] Pager [phone]  If 7PM-7AM, please contact night-coverage [email or web address] Password [facility] 01/07/2012, 5:53 AM

## 2012-01-07 NOTE — ED Provider Notes (Signed)
History     CSN: [id number]  Arrival date & time 01/07/12  7829   First MD Initiated Contact with Patient 01/07/12 [phone]      Chief Complaint  Patient presents with  . Fever  . Chills    (Consider location/radiation/quality/duration/timing/severity/associated sxs/prior treatment) HPI Comments: Patient presents with one day of fever and right flank pain. Home health RN recorded a temperature of 101. Patient endorses body aches, chills, myalgias and arthralgias. He had a bladder resection and urostomy created at [facility] on October 9 for bladder cancer. He denies any chest pain, shortness of breath, cough.  The history is provided by the patient and the spouse.    Past Medical History  Diagnosis Date  . Gross hematuria   . C. difficile enteritis 07/04/2011  . Diverticulitis   . Bladder cancer 01/07/2012    Transitional Cell Carcinoma, s/p bladder, prostate and lymph node resection with ileal conduit at [facility] on 10/9, discharged on 10/14     Past Surgical History  Procedure Date  . Elbow surgery   . Transurethral resection of bladder tumor 10/26/2011    Procedure: TRANSURETHRAL RESECTION OF BLADDER TUMOR (TURBT);  Surgeon: Ky Barban, MD;  Location: [facility];  Service: Urology;  Laterality: N/A;  . Transurethral resection of prostate 10/26/2011    Procedure: TRANSURETHRAL RESECTION OF THE PROSTATE (TURP);  Surgeon: Ky Barban, MD;  Location: [facility];  Service: Urology;  Laterality: N/A;    Family History  Problem Relation Age of Onset  . Heart failure Father   . Hypertension Mother     History  Substance Use Topics  . Smoking status: Former Smoker    Quit date: 12/03/2007  . Smokeless tobacco: Not on file  . Alcohol Use: No      Review of Systems  Constitutional: Positive for fever, activity change, appetite change and fatigue.  HENT: Negative for congestion and rhinorrhea.   Respiratory: Negative for cough, chest tightness and shortness of breath.     Cardiovascular: Negative for chest pain.  Gastrointestinal: Positive for nausea. Negative for vomiting and abdominal pain.  Genitourinary: Negative for dysuria and hematuria.  Musculoskeletal: Positive for back pain.  Skin: Negative for rash.  Neurological: Positive for weakness. Negative for headaches.    Allergies  Review of patient's allergies indicates no known allergies.  Home Medications   No current outpatient prescriptions on file.  BP 121/67  Pulse 69  Temp 97.8 F (36.6 C) (Oral)  Resp 18  Ht 6\' 1"  (1.854 m)  Wt 220 lb 1.6 oz (99.837 kg)  BMI 29.04 kg/m2  SpO2 95%  Physical Exam  Constitutional: He is oriented to person, place, and time. He appears well-developed and well-nourished. No distress.       Feels warm  HENT:  Head: Normocephalic and atraumatic.  Mouth/Throat: Oropharynx is clear and moist. No oropharyngeal exudate.  Eyes: Conjunctivae normal and EOM are normal. Pupils are equal, round, and reactive to light.  Neck: Normal range of motion. Neck supple.  Cardiovascular: Normal rate, regular rhythm and normal heart sounds.   No murmur heard. Pulmonary/Chest: Effort normal and breath sounds normal. No respiratory distress.  Abdominal: Soft. There is tenderness. There is no rebound and no guarding.       Right-sided urostomy with clear urine with sediment incisions healing well, no bleeding or drainage Ostomy pink, stent in place  Musculoskeletal: Normal range of motion. He exhibits no edema and no tenderness.  Neurological: He is alert and oriented  to person, place, and time. No cranial nerve deficit.       Moving all extremities, no deficits.  Skin: Skin is warm.    ED Course  Procedures (including critical care time)  Labs Reviewed  CBC WITH DIFFERENTIAL - Abnormal; Notable for the following:    WBC 15.0 (*)     Hemoglobin 11.5 (*)     HCT 34.4 (*)     Neutrophils Relative 83 (*)     Neutro Abs 12.6 (*)     Lymphocytes Relative 5 (*)      Monocytes Absolute 1.4 (*)     All other components within normal limits  COMPREHENSIVE METABOLIC PANEL - Abnormal; Notable for the following:    Glucose, Bld 129 (*)     BUN 24 (*)     Creatinine, Ser 1.36 (*)     Calcium 8.1 (*)     Total Protein 5.6 (*)     Albumin 2.3 (*)     AST 43 (*)     Alkaline Phosphatase 125 (*)     GFR calc non Af Amer 56 (*)     GFR calc Af Amer 65 (*)     All other components within normal limits  URINALYSIS, ROUTINE W REFLEX MICROSCOPIC - Abnormal; Notable for the following:    APPearance HAZY (*)     Hgb urine dipstick LARGE (*)     Protein, ur TRACE (*)     Nitrite POSITIVE (*)     Leukocytes, UA LARGE (*)     All other components within normal limits  HEPATIC FUNCTION PANEL - Abnormal; Notable for the following:    Total Protein 5.6 (*)     Albumin 2.3 (*)     AST 42 (*)     Alkaline Phosphatase 126 (*)     All other components within normal limits  URINE MICROSCOPIC-ADD ON - Abnormal; Notable for the following:    Bacteria, UA MANY (*)     All other components within normal limits  LACTIC ACID, PLASMA  TROPONIN I  URINE CULTURE  CULTURE, BLOOD (ROUTINE X 2)  CULTURE, BLOOD (ROUTINE X 2)  TROPONIN I  TROPONIN I   Dg Chest 2 View  01/07/2012  *RADIOLOGY REPORT*  Clinical Data: Chest pain, shortness of breath. Bladder mass.  CHEST - 2 VIEW  Comparison: 07/02/2011 abdominal CT  Findings: Heart size upper normal.  Right hilar prominence.  Mild lung base opacities.  There is nonspecific subcutaneous gas bilaterally.  No acute osseous finding.  IMPRESSION: Right hilar prominence may correspond to prominent  pulmonary arterial branches.  However, in the setting of a known primary malignancy, excluding adenopathy is reasonable.  I do not see a prior chest CT in our system.  Nonspecific chest wall subcutaneous gas bilaterally.  Mild bibasilar atelectasis.     Original Report Authenticated By: Waneta Martins, M.D.      1. Postoperative fever         MDM  Fever, chills, generalized weakness, recent surgery. Vital stable, no peritoneal signs.  We'll initiate septic workup with labs, cultures, chest x-ray, broad spectrum antibiotics.  CT abdomen to evaluate for possible postoperative infection, CT angio chest to rule out PE given CA history, recent surgery and borderline hypoxia. Admission d./w Dr. Phillips Odor.   Date: 01/07/2012  Rate: 72  Rhythm: normal sinus rhythm  QRS Axis: normal  Intervals: normal  ST/T Wave abnormalities: nonspecific ST/T changes  Conduction Disutrbances:none  Narrative Interpretation:  Old EKG Reviewed: unchanged       Glynn Octave, MD 01/07/12 [phone]

## 2012-01-07 NOTE — Progress Notes (Signed)
01/07/12 1236 Patient c/o shaking and feeling chills like he had felt prior to admission this morning about 1130. Temperature checked orally was 97.8 per NT report, patient requested rectal temperature checked since oral temperature had not been accurate in ER, 100 F. Notified Dr Sherrie Mustache, stated okay to administer tylenol as ordered PRN for fever.  Temperature rechecked 97.3 orally (at patient request). Denies chills, no visible shaking at this time. Nursing to monitor. Earnstine Regal, RN

## 2012-01-07 NOTE — Progress Notes (Signed)
01/07/12 1743 Patient left floor in stable condition via [facility] transport, patient's wife at bedside aware of transfer. Notified receiving unit at [facility], spoke with Fleet Contras of patient on the way as requested. Earnstine Regal, RN

## 2012-01-07 NOTE — Progress Notes (Signed)
[name] from [facility] hospital called to get recent vital signs for the pt being transported to be sure he was stable for transport.  Recent Documented VS: T: 98.1 P: 75 R: 18 BP 103/62 O2 sats 95 on  2 LPM.  She verbalized understanding.  [name] stated that the pt would be going to room R722 and report should be called to [phone].

## 2012-01-07 NOTE — Progress Notes (Signed)
01/07/12 1626 Patient being transferred to [facility]. Called report to [place] Huck, RN at [facility] Awaiting transport for discharge. Earnstine Regal, RN

## 2012-01-07 NOTE — Progress Notes (Signed)
ANTIBIOTIC CONSULT NOTE - INITIAL  Pharmacy Consult for Vancomycin Indication: fever work-up and empiric IV antibiotics  No Known Allergies  Patient Measurements: Height: 6\' 1"  (185.4 cm) Weight: 220 lb 1.6 oz (99.837 kg) IBW/kg (Calculated) : 79.9   Vital Signs: Temp: 97.8 F (36.6 C) (10/25 0650) Temp src: Oral (10/25 0650) BP: 121/67 mmHg (10/25 0650) Pulse Rate: 69  (10/25 0650) Intake/Output from previous day:   Intake/Output from this shift:    Labs:  Basename 01/07/12 0407  WBC 15.0*  HGB 11.5*  PLT 323  LABCREA --  CREATININE 1.36*   Estimated Creatinine Clearance: 74.5 ml/min (by C-G formula based on Cr of 1.36). No results found for this basename: VANCOTROUGH:2,VANCOPEAK:2,VANCORANDOM:2,GENTTROUGH:2,GENTPEAK:2,GENTRANDOM:2,TOBRATROUGH:2,TOBRAPEAK:2,TOBRARND:2,AMIKACINPEAK:2,AMIKACINTROU:2,AMIKACIN:2, in the last 72 hours   Microbiology: No results found for this or any previous visit (from the past 720 hour(s)).  Medical History: Past Medical History  Diagnosis Date  . Gross hematuria   . C. difficile enteritis 07/04/2011  . Diverticulitis   . Bladder cancer 01/07/2012    Transitional Cell Carcinoma, s/p bladder, prostate and lymph node resection with ileal conduit at [facility] on 10/9, discharged on 10/14     Medications:  Scheduled:    . acetaminophen  650 mg Oral Once  . aspirin EC  81 mg Oral Daily  . docusate sodium  100 mg Oral BID  . enoxaparin (LOVENOX) injection  40 mg Subcutaneous Q24H  .  morphine injection  4 mg Intravenous Once  . ondansetron  4 mg Intravenous Once  . pantoprazole  40 mg Oral Daily  . piperacillin-tazobactam (ZOSYN)  IV  3.375 g Intravenous Once  . piperacillin-tazobactam (ZOSYN)  IV  3.375 g Intravenous Q8H  . sodium chloride  1,000 mL Intravenous Once  . sodium chloride  3 mL Intravenous Q12H  . sodium chloride      . vancomycin  1,000 mg Intravenous Once   Assessment: Okay for Protocol Estimated Creatinine  Clearance: 74.5 ml/min (by C-G formula based on Cr of 1.36). Recent bladder resection at [facility].  Goal of Therapy:  Vancomycin trough level 15-20 mcg/ml  Plan:  Vancomycin 1250mg  IV every 12 hours. Measure antibiotic drug levels at steady state Follow up culture results  [name] 01/07/2012,8:07 AM

## 2012-01-07 NOTE — Progress Notes (Signed)
01/07/12 1705 Received call from [facility] transport, stated would be arriving around 1730 to pick up patient. Report given to Fayrene Fearing, with transport. Attempted to notify patient's wife at patient request, no answer and no voicemail to leave message. Will attempt to notify again. Earnstine Regal, RN

## 2012-01-07 NOTE — Progress Notes (Signed)
UR Chart Review Completed  

## 2012-01-07 NOTE — Progress Notes (Signed)
01/07/12 1327 Patient c/o back pain and discomfort to chest, stated "it feels like really bad heartburn". Did not feel he needed maalox as ordered, discussed with dr Sherrie Mustache this afternoon. Stated most likely related to gas, states had encouraged patient to sit up and move a little to assist with discomfort. Nursing to monitor and encourage patient to sit up some if further c/o gas discomfort. Earnstine Regal, RN

## 2012-01-07 NOTE — Discharge Summary (Addendum)
Physician Discharge Summary  Maximilian Tallo [facility] ZOX:[id number] DOB: 03-04-1956 DOA: 01/07/2012  PCP: No PCP on file.  Admit date: 01/07/2012 Discharge date: 01/07/2012  Time spent: Greater than 30 minutes  Recommendations for Outpatient Follow-up:  1. The patient is being discharged/transferred to [facility] in the care of urologist, Dr. Logan Bores.  Discharge Diagnoses:  1. Transitional cell carcinoma, status post bladder, prostate, and lymph node resection with ileal conduit at [facility] on 12/22/2011. Discharged on 12/27/2011. (Dr. Luane School, urologist). 2. Acute right pyelonephritis. 3. Bilateral hydronephrosis. 4. Extensive subcutaneous emphysema from the super clavicle region to the inguinal regions bilaterally, presumed to be postoperative. 5. Radiographic evidence of cirrhosis. 6. Splenomegaly. 7. Anemia secondary to subacute blood loss (GU blood loss). 8. Acute renal insufficiency. 9. Presenting symptoms with fever, shortness of breath, chest pain, and epigastric abdominal pain.  Discharge Condition: Stable.  Diet recommendation: As tolerated.  Filed Weights   01/07/12 0650  Weight: 99.837 kg (220 lb 1.6 oz)    History of present illness:  The patient is a 56 year old man with a recent diagnosis of transitional cell carcinoma of the bladder. He underwent a total cystectomy, prostatectomy, and lymph node resection with ileal conduit at [facility] on 12/22/2011 by Dr. Luane School. He was discharged on 12/27/2011. He presented to the emergency department this morning on 01/07/2012 with a chief complaint of fever, rigors, bilateral flank pain, shortness of breath, and chest/epigastric pain. In the emergency department, his temperature was 101.59F. His lab data were significant for a BUN of 24, creatinine of 1.36, AST of 43, troponin I of less than 0.30, normal lactic acid 1.5, and W. BC of 15.0. His urinalysis revealed  nitrites, large leukocytes, too numerous to count WBCs, too numerous to count RBCs, and many bacteria. His EKG revealed normal sinus rhythm with left axis deviation and a heart rate of 72 beats per minute. He was admitted for further evaluation and management.  Hospital Course:  Blood cultures and a urine culture were ordered in the emergency department. He was started empirically on vancomycin and Zosyn. IV fluids were started for hydration. Because of the symptomatology, there was some concern for a possible PE, abdominal abscess, or other intra-abdominal post operative complication. Therefore, CT angiogram of his chest and CT of his abdomen/pelvis were ordered. The results were significant for no evidence of pulmonary embolism; extensive subcutaneous emphysema extending from the chest throughout the abdomen anteriorly in and around the bilateral flanks into the pelvis and inguinal regions; there is a small focus of gas seen within the right rectus muscle; there is hepatic irregularity and interval increase in the size of the caudate lobe since 2007; splenomegaly; bilateral hydronephrosis with perinephric edema at the right kidney; and ureteral stents bilaterally. There is no evidence of osseous metastasis or an abnormal fluid collection seen within the area of the absent bladder and prostate gland surgically. Also, cardiac enzymes were ordered. They were all negative.  Following the revelation of the radiographic findings, I called the patient's urologist Dr.Hemal at [facility]. In his absence, I spoke to Dr. Benard Rink. I discussed the clinical findings with him. I asked if he would be willing to accept the patient at [facility] as this was felt to be a postoperative issue. The exact etiology of the extensive subcutaneous emphysema was not clear, although, the patient did have a laparoscopic procedure/operation. However, the patient is 2 weeks postoperative. Dr. Benard Rink  graciously  accepted. (His colleague Dr. Logan Bores will be the accepting physician). This was discussed with the patient and his wife. They were both in agreement.  The patient is less symptomatic. He is currently afebrile and hemodynamically stable.    Procedures:  None  Consultations:  None  Discharge Exam: Filed Vitals:   01/07/12 0540 01/07/12 0618 01/07/12 0650 01/07/12 1132  BP: 116/69 113/70 121/67   Pulse: 73 73 69   Temp:   97.8 F (36.6 C) 100 F (37.8 C)  TempSrc:   Oral Rectal  Resp: 20 18 18    Height:   6\' 1"  (1.854 m)   Weight:   99.837 kg (220 lb 1.6 oz)   SpO2: 95% 96% 95%     General: Alert 56 year old Caucasian man lying in bed, in no acute distress. Cardiovascular: S1, S2, with a soft systolic murmur. Respiratory: Mild splinting, clear anteriorly, decreased breath sounds in the bases. Breathing currently nonlabored. Abdomen: Postoperative scars with no surrounding erythema. Right-sided ileal conduit with pink stoma. Tan colored material in the ostomy bag. Abdomen is mildly distended, and mildly diffusely tender. Bowel sounds are present. Extremities: Trace of pedal edema. Neurologic: He is alert and oriented x3.  Discharge Instructions  Discharge Orders    Future Orders Please Complete By Expires   Diet - low sodium heart healthy      Increase activity slowly          Medication List     As of 01/07/2012 12:27 PM    TAKE these medications         acetaminophen 325 MG tablet   Commonly known as: TYLENOL   Take 2 tablets (650 mg total) by mouth every 6 (six) hours as needed (or Fever >/= 101).      ALPRAZolam 1 MG tablet   Commonly known as: XANAX   Take 0.5 mg by mouth 2 (two) times daily.      docusate sodium 100 MG capsule   Commonly known as: COLACE   Take 100 mg by mouth 2 (two) times daily as needed. Constipation      enoxaparin 40 MG/0.4ML injection   Commonly known as: LOVENOX   Inject 0.4 mLs (40 mg total) into the skin daily.        HYDROcodone-acetaminophen 5-325 MG per tablet   Commonly known as: NORCO/VICODIN   Take 1 tablet by mouth every 6 (six) hours as needed.      pantoprazole 40 MG tablet   Commonly known as: PROTONIX   Take 1 tablet (40 mg total) by mouth daily.      piperacillin-tazobactam 3.375 GM/50ML IVPB   Commonly known as: ZOSYN   Inject 50 mLs (3.375 g total) into the vein every 8 (eight) hours.      sodium chloride 0.9 % SOLN 250 mL with vancomycin 1000 MG SOLR 1,250 mg   Inject 1,250 mg into the vein every 12 (twelve) hours.      traZODone 50 MG tablet   Commonly known as: DESYREL   Take 50 mg by mouth at bedtime as needed. Sleep          The results of significant diagnostics from this hospitalization (including imaging, microbiology, ancillary and laboratory) are listed below for reference.    Significant Diagnostic Studies: Dg Chest 2 View  01/07/2012  *RADIOLOGY REPORT*  Clinical Data: Chest pain, shortness of breath. Bladder mass.  CHEST - 2 VIEW  Comparison: 07/02/2011 abdominal CT  Findings: Heart size upper normal.  Right hilar  prominence.  Mild lung base opacities.  There is nonspecific subcutaneous gas bilaterally.  No acute osseous finding.  IMPRESSION: Right hilar prominence may correspond to prominent  pulmonary arterial branches.  However, in the setting of a known primary malignancy, excluding adenopathy is reasonable.  I do not see a prior chest CT in our system.  Nonspecific chest wall subcutaneous gas bilaterally.  Mild bibasilar atelectasis.     Original Report Authenticated By: Waneta Martins, M.D.    Ct Angio Chest Pe W/cm &/or Wo Cm  01/07/2012  *RADIOLOGY REPORT*  Clinical Data:  Fever, shortness of breath, indigestion, epigastric pain, flank pain, right ears, night sweats, 3 weeks post cystoprostatectomy secondary to bladder cancer  CT ANGIOGRAPHY CHEST CT ABDOMEN AND PELVIS WITH CONTRAST  Technique:  Multidetector CT imaging of the chest was performed using  the standard protocol during bolus administration of intravenous contrast.  Multiplanar CT image reconstructions including MIPs were obtained to evaluate the vascular anatomy. Multidetector CT imaging of the abdomen and pelvis was performed using the standard protocol during bolus administration of intravenous contrast.  Contrast:  100 ml Omnipaque 350 IV. Dilute oral contrast.  Comparison:  CT abdomen and pelvis 07/03/2013and 08/10/2005  CTA CHEST  Findings: Aorta normal caliber without aneurysm or dissection. Pulmonary arteries patent. No evidence of pulmonary embolism. No thoracic adenopathy. Dependent atelectasis in bilateral lower lobes. 3 mm diameter right middle lobe nodule image 66. 5 mm left lower lobe nodule image 70 and adjacent 3 mm nodule image 71. No additional pulmonary mass, nodule, infiltrate, pleural effusion, or pneumothorax. Extensive subcutaneous edema identified throughout the chest wall bilaterally extending supraclavicular. No acute osseous findings.  Review of the MIP images confirms the above findings.  IMPRESSION: No evidence of pulmonary embolism. Dependent atelectasis bilateral lower lobes. Bilateral pulmonary nodules; the right middle lobe nodule is unchanged since 09/15/2011 while the left lower lobe pulmonary nodules are unchanged since 08/10/2005. Extensive subcutaneous emphysema throughout chest extending supraclavicular, uncertain etiology, see below.  CT ABDOMEN AND PELVIS  Findings: Extensive subcutaneous edema extending from the chest throughout the abdomen anteriorly in and bilateral flanks into the pelvis and inguinal regions.  Small focus of gas is also seen within the right rectus muscle image 68. No definite free intraperitoneal air. Hepatic margins appear minimally irregular, cannot exclude cirrhosis. Interval increase in size of caudate lobe since 2007. Splenomegaly, spleen 15.3 x 7.6 x 13.9 cm. No definite focal abnormalities of the liver, spleen, pancreas, or adrenal  glands. Bilateral hydronephrosis with perinephric edema at right kidney.  Bilateral ureteral stents are visualized extending through the nondilated ureters to a urostomy in the right mid abdomen. Delayed right nephrogram versus left with patchy appearance of right nephrogram highly suspicious for pyelonephritis. Normal appendix. Stomach and bowel loops otherwise unremarkable. Bladder and prostate gland surgically absent without the abnormal fluid collection, mass or adenopathy at the surgical bed. Significant facet degenerative changes lower lumbar spine. No osseous metastases identified.  IMPRESSION: Post cystoprostatectomy with bilateral hydronephrosis. Abnormal right nephrogram with perinephric edema highly suspicious for pyelonephritis. Extensive subcutaneous emphysema from the supraclavicular region to the inguinal regions bilaterally, uncertain etiology. This could be the result of a barotrauma, residual from surgery though atypical to persist 3 weeks postoperative; do not see definitive pneumothorax or pneumomediastinum to account for finding. Subcutaneous infection by gas forming organism is also a consideration though there is a lack of associated skin thickening and subcutaneous edema.  Findings called to Dr. Sherrie Mustache on 01/07/2012 at 1000 hours.  Original Report Authenticated By: Lollie Marrow, M.D.    Ct Abdomen Pelvis W Contrast  01/07/2012  *RADIOLOGY REPORT*  Clinical Data:  Fever, shortness of breath, indigestion, epigastric pain, flank pain, right ears, night sweats, 3 weeks post cystoprostatectomy secondary to bladder cancer  CT ANGIOGRAPHY CHEST CT ABDOMEN AND PELVIS WITH CONTRAST  Technique:  Multidetector CT imaging of the chest was performed using the standard protocol during bolus administration of intravenous contrast.  Multiplanar CT image reconstructions including MIPs were obtained to evaluate the vascular anatomy. Multidetector CT imaging of the abdomen and pelvis was performed using  the standard protocol during bolus administration of intravenous contrast.  Contrast:  100 ml Omnipaque 350 IV. Dilute oral contrast.  Comparison:  CT abdomen and pelvis 07/03/2013and 08/10/2005  CTA CHEST  Findings: Aorta normal caliber without aneurysm or dissection. Pulmonary arteries patent. No evidence of pulmonary embolism. No thoracic adenopathy. Dependent atelectasis in bilateral lower lobes. 3 mm diameter right middle lobe nodule image 66. 5 mm left lower lobe nodule image 70 and adjacent 3 mm nodule image 71. No additional pulmonary mass, nodule, infiltrate, pleural effusion, or pneumothorax. Extensive subcutaneous edema identified throughout the chest wall bilaterally extending supraclavicular. No acute osseous findings.  Review of the MIP images confirms the above findings.  IMPRESSION: No evidence of pulmonary embolism. Dependent atelectasis bilateral lower lobes. Bilateral pulmonary nodules; the right middle lobe nodule is unchanged since 09/15/2011 while the left lower lobe pulmonary nodules are unchanged since 08/10/2005. Extensive subcutaneous emphysema throughout chest extending supraclavicular, uncertain etiology, see below.  CT ABDOMEN AND PELVIS  Findings: Extensive subcutaneous edema extending from the chest throughout the abdomen anteriorly in and bilateral flanks into the pelvis and inguinal regions.  Small focus of gas is also seen within the right rectus muscle image 68. No definite free intraperitoneal air. Hepatic margins appear minimally irregular, cannot exclude cirrhosis. Interval increase in size of caudate lobe since 2007. Splenomegaly, spleen 15.3 x 7.6 x 13.9 cm. No definite focal abnormalities of the liver, spleen, pancreas, or adrenal glands. Bilateral hydronephrosis with perinephric edema at right kidney.  Bilateral ureteral stents are visualized extending through the nondilated ureters to a urostomy in the right mid abdomen. Delayed right nephrogram versus left with patchy  appearance of right nephrogram highly suspicious for pyelonephritis. Normal appendix. Stomach and bowel loops otherwise unremarkable. Bladder and prostate gland surgically absent without the abnormal fluid collection, mass or adenopathy at the surgical bed. Significant facet degenerative changes lower lumbar spine. No osseous metastases identified.  IMPRESSION: Post cystoprostatectomy with bilateral hydronephrosis. Abnormal right nephrogram with perinephric edema highly suspicious for pyelonephritis. Extensive subcutaneous emphysema from the supraclavicular region to the inguinal regions bilaterally, uncertain etiology. This could be the result of a barotrauma, residual from surgery though atypical to persist 3 weeks postoperative; do not see definitive pneumothorax or pneumomediastinum to account for finding. Subcutaneous infection by gas forming organism is also a consideration though there is a lack of associated skin thickening and subcutaneous edema.  Findings called to Dr. Sherrie Mustache on 01/07/2012 at 1000 hours.   Original Report Authenticated By: Lollie Marrow, M.D.     Microbiology: Recent Results (from the past 240 hour(s))  CULTURE, BLOOD (ROUTINE X 2)     Status: Normal (Preliminary result)   Collection Time   01/07/12  4:06 AM      Component Value Range Status Comment   Specimen Description Blood   Final    Special Requests NONE   Final  Culture NO GROWTH <24 HRS   Final    Report Status PENDING   Incomplete   CULTURE, BLOOD (ROUTINE X 2)     Status: Normal (Preliminary result)   Collection Time   01/07/12  4:08 AM      Component Value Range Status Comment   Specimen Description Blood   Final    Special Requests NONE   Final    Culture NO GROWTH <24 HRS   Final    Report Status PENDING   Incomplete      Labs: Basic Metabolic Panel:  Lab 01/07/12 2130  NA 135  K 3.5  CL 103  CO2 23  GLUCOSE 129*  BUN 24*  CREATININE 1.36*  CALCIUM 8.1*  MG --  PHOS --   Liver Function  Tests:  Lab 01/07/12 0711 01/07/12 0407  AST 42* 43*  ALT 36 36  ALKPHOS 126* 125*  BILITOT 0.6 0.6  PROT 5.6* 5.6*  ALBUMIN 2.3* 2.3*   No results found for this basename: LIPASE:5,AMYLASE:5 in the last 168 hours No results found for this basename: AMMONIA:5 in the last 168 hours CBC:  Lab 01/07/12 0407  WBC 15.0*  NEUTROABS 12.6*  HGB 11.5*  HCT 34.4*  MCV 80.4  PLT 323   Cardiac Enzymes:  Lab 01/07/12 1110 01/07/12 0515  CKTOTAL -- --  CKMB -- --  CKMBINDEX -- --  TROPONINI <0.30 <0.30   BNP: BNP (last 3 results) No results found for this basename: PROBNP:3 in the last 8760 hours CBG: No results found for this basename: GLUCAP:5 in the last 168 hours     Signed:  Terrianne Cavness  [facility] 01/07/2012, 12:27 PM

## 2012-01-07 NOTE — Plan of Care (Signed)
Problem: Discharge Progression Outcomes Goal: Discharge plan in place and appropriate Outcome: Completed/Met Date Met:  01/07/12 01/07/12 1744 Transferred to [facility] this evening.  Goal: Hemodynamically stable Outcome: Adequate for Discharge Adequate for transfer

## 2012-01-07 NOTE — ED Notes (Addendum)
Fever of 101.4 at home per  Home health nurse. Had bladder ,prostate and , lymph nodes removed per [facility] on 12/22/11

## 2012-01-07 NOTE — Care Management Note (Unsigned)
    Page 1 of 1   01/07/2012     12:00:05 PM   CARE MANAGEMENT NOTE 01/07/2012  Patient:  [name], [name]   Account Number:  [id number]  Date Initiated:  01/07/2012  Documentation initiated by:  Sharrie Rothman  Subjective/Objective Assessment:   Pt admitted with fever and UTI. Pt lives at home with wife and is currently being seen by a [facility] RN. Pt will return home at discharge.     Action/Plan:   Pt is being transfered to [facility]. No other CM or HH needs noted.   Anticipated DC Date:  01/07/2012   Anticipated DC Plan:  ACUTE TO ACUTE TRANS      DC Planning Services  CM consult      Choice offered to / List presented to:             Status of service:  Completed, signed off Medicare Important Message given?   (If response is "NO", the following Medicare IM given date fields will be blank) Date Medicare IM given:   Date Additional Medicare IM given:    Discharge Disposition:  ACUTE TO ACUTE TRANS  Per UR Regulation:    If discussed at Long Length of Stay Meetings, dates discussed:    Comments:  01/07/12 1200 Arlyss Queen, RN BSN CM

## 2012-01-09 LAB — CULTURE, BLOOD (ROUTINE X 2)

## 2012-01-10 LAB — URINE CULTURE: Colony Count: >=100000

## 2012-01-12 ENCOUNTER — Encounter (HOSPITAL_COMMUNITY): Payer: Self-pay | Admitting: *Deleted

## 2012-01-12 ENCOUNTER — Observation Stay (HOSPITAL_COMMUNITY)
Admission: EM | Admit: 2012-01-12 | Discharge: 2012-01-13 | Disposition: A | Discharge status: Home / Self Care | Payer: Medicaid Other | Attending: Internal Medicine | Admitting: Internal Medicine

## 2012-01-12 ENCOUNTER — Emergency Department (HOSPITAL_COMMUNITY): Payer: Medicaid Other

## 2012-01-12 DIAGNOSIS — R079 Chest pain, unspecified: Principal | ICD-10-CM

## 2012-01-12 DIAGNOSIS — C679 Malignant neoplasm of bladder, unspecified: Secondary | ICD-10-CM

## 2012-01-12 DIAGNOSIS — T8182XA Emphysema (subcutaneous) resulting from a procedure, initial encounter: Secondary | ICD-10-CM

## 2012-01-12 DIAGNOSIS — N289 Disorder of kidney and ureter, unspecified: Secondary | ICD-10-CM | POA: Diagnosis present

## 2012-01-12 DIAGNOSIS — R0600 Dyspnea, unspecified: Secondary | ICD-10-CM

## 2012-01-12 DIAGNOSIS — Z23 Encounter for immunization: Secondary | ICD-10-CM | POA: Insufficient documentation

## 2012-01-12 DIAGNOSIS — R1013 Epigastric pain: Secondary | ICD-10-CM

## 2012-01-12 DIAGNOSIS — R3129 Other microscopic hematuria: Secondary | ICD-10-CM

## 2012-01-12 DIAGNOSIS — R509 Fever, unspecified: Secondary | ICD-10-CM

## 2012-01-12 DIAGNOSIS — K7460 Unspecified cirrhosis of liver: Secondary | ICD-10-CM

## 2012-01-12 DIAGNOSIS — N1330 Unspecified hydronephrosis: Secondary | ICD-10-CM

## 2012-01-12 DIAGNOSIS — D500 Iron deficiency anemia secondary to blood loss (chronic): Secondary | ICD-10-CM

## 2012-01-12 DIAGNOSIS — N10 Acute tubulo-interstitial nephritis: Secondary | ICD-10-CM

## 2012-01-12 DIAGNOSIS — N390 Urinary tract infection, site not specified: Secondary | ICD-10-CM

## 2012-01-12 LAB — CBC WITH DIFFERENTIAL/PLATELET
Basophils Absolute: 0.1 10*3/uL (ref 0.0–0.1)
HCT: 37.7 % — ABNORMAL LOW (ref 39.0–52.0)
Lymphocytes Relative: 24 % (ref 12–46)
Neutro Abs: 4.9 10*3/uL (ref 1.7–7.7)
Neutrophils Relative %: 62 % (ref 43–77)
Platelets: 547 10*3/uL — ABNORMAL HIGH (ref 150–400)
RDW: 15 % (ref 11.5–15.5)
WBC: 7.9 10*3/uL (ref 4.0–10.5)

## 2012-01-12 LAB — BASIC METABOLIC PANEL
Calcium: 9.3 mg/dL (ref 8.4–10.5)
Chloride: 106 mEq/L (ref 96–112)
Creatinine, Ser: 1.55 mg/dL — ABNORMAL HIGH (ref 0.50–1.35)
GFR calc Af Amer: 56 mL/min — ABNORMAL LOW (ref >90)
GFR calc non Af Amer: 48 mL/min — ABNORMAL LOW (ref >90)

## 2012-01-12 LAB — TROPONIN I
Troponin I: <0.3 ng/mL (ref <0.30)
Troponin I: <0.3 ng/mL (ref <0.30)
Troponin I: <0.3 ng/mL (ref <0.30)

## 2012-01-12 LAB — D-DIMER, QUANTITATIVE: D-Dimer, Quant: 2.29 ug/mL-FEU — ABNORMAL HIGH (ref 0.00–0.48)

## 2012-01-12 MED ORDER — PNEUMOCOCCAL VAC POLYVALENT 25 MCG/0.5ML IJ INJ
0.5000 mL | INJECTION | INTRAMUSCULAR | Status: AC
  Administered 2012-01-13: 0.5 mL via INTRAMUSCULAR
  Filled 2012-01-12: qty 0.5

## 2012-01-12 MED ORDER — TRAZODONE HCL 50 MG PO TABS
50.0000 mg | ORAL_TABLET | Freq: Every evening | ORAL | Status: DC | PRN
  Administered 2012-01-12: 50 mg via ORAL
  Filled 2012-01-12: qty 1

## 2012-01-12 MED ORDER — SODIUM CHLORIDE 0.9 % IJ SOLN
3.0000 mL | Freq: Two times a day (BID) | INTRAMUSCULAR | Status: DC
  Administered 2012-01-12 – 2012-01-13 (×2): 3 mL via INTRAVENOUS
  Filled 2012-01-12 (×2): qty 3

## 2012-01-12 MED ORDER — ALUM & MAG HYDROXIDE-SIMETH 200-200-20 MG/5ML PO SUSP: 30.0000 mL | Freq: Four times a day (QID) | ORAL | Status: DC | PRN

## 2012-01-12 MED ORDER — TECHNETIUM TC 99M DIETHYLENETRIAME-PENTAACETIC ACID
35.0000 | Freq: Once | INTRAVENOUS | Status: AC | PRN
  Administered 2012-01-12: 35 via RESPIRATORY_TRACT

## 2012-01-12 MED ORDER — CEFUROXIME AXETIL 250 MG PO TABS
500.0000 mg | ORAL_TABLET | Freq: Two times a day (BID) | ORAL | Status: DC
  Administered 2012-01-12 – 2012-01-13 (×2): 500 mg via ORAL
  Filled 2012-01-12 (×2): qty 2

## 2012-01-12 MED ORDER — HEPARIN SODIUM (PORCINE) 5000 UNIT/ML IJ SOLN
5000.0000 [IU] | Freq: Three times a day (TID) | INTRAMUSCULAR | Status: DC
  Administered 2012-01-12 – 2012-01-13 (×2): 5000 [IU] via SUBCUTANEOUS
  Filled 2012-01-12 (×2): qty 1

## 2012-01-12 MED ORDER — NITROGLYCERIN 0.4 MG SL SUBL
0.4000 mg | SUBLINGUAL_TABLET | SUBLINGUAL | Status: DC | PRN
  Administered 2012-01-12 (×3): 0.4 mg via SUBLINGUAL

## 2012-01-12 MED ORDER — DOCUSATE SODIUM 100 MG PO CAPS
100.0000 mg | ORAL_CAPSULE | Freq: Two times a day (BID) | ORAL | Status: DC
  Administered 2012-01-12: 100 mg via ORAL
  Filled 2012-01-12 (×2): qty 1

## 2012-01-12 MED ORDER — ACETAMINOPHEN 325 MG PO TABS: 650.0000 mg | ORAL_TABLET | Freq: Four times a day (QID) | ORAL | Status: DC | PRN

## 2012-01-12 MED ORDER — ASPIRIN EC 81 MG PO TBEC
81.0000 mg | DELAYED_RELEASE_TABLET | Freq: Every day | ORAL | Status: DC
  Administered 2012-01-13: 81 mg via ORAL
  Filled 2012-01-12 (×2): qty 1

## 2012-01-12 MED ORDER — TECHNETIUM TO 99M ALBUMIN AGGREGATED
6.0000 | Freq: Once | INTRAVENOUS | Status: AC | PRN
  Administered 2012-01-12: 6 via INTRAVENOUS

## 2012-01-12 MED ORDER — ONDANSETRON HCL 4 MG PO TABS: 4.0000 mg | ORAL_TABLET | Freq: Four times a day (QID) | ORAL | Status: DC | PRN

## 2012-01-12 MED ORDER — ONDANSETRON HCL 4 MG/2ML IJ SOLN: 4.0000 mg | Freq: Four times a day (QID) | INTRAMUSCULAR | Status: DC | PRN

## 2012-01-12 MED ORDER — ALPRAZOLAM 0.5 MG PO TABS: 0.5000 mg | ORAL_TABLET | Freq: Two times a day (BID) | ORAL | Status: DC | PRN

## 2012-01-12 MED ORDER — HYDROCODONE-ACETAMINOPHEN 5-325 MG PO TABS: 1.0000 | ORAL_TABLET | Freq: Four times a day (QID) | ORAL | Status: DC | PRN

## 2012-01-12 MED ORDER — ASPIRIN 81 MG PO CHEW
324.0000 mg | CHEWABLE_TABLET | Freq: Once | ORAL | Status: AC
  Administered 2012-01-12: 324 mg via ORAL
  Filled 2012-01-12: qty 4

## 2012-01-12 NOTE — H&P (Signed)
Hospital Admission Note Date: 01/12/2012  Patient name: [name] [facility] Medical record number: [id number] Date of birth: 1955/07/05 Age: 56 y.o. Gender: male PCP: [facility] health Department  Attending physician: Christiane Ha, MD  Chief Complaint: Chest/epigastric pain  History of Present Illness:  [name] is an 56 y.o. male who presents with substernal and epigastric pain. It occurred after taking ciprofloxacin. Initially, it felt like heartburn, then heaviness. It was relieved with nitroglycerin in the emergency room. He was not given aspirin in the emergency room. He has a complex medical history and recently had bladder prostate and lymph node resection with ileal conduit for transitional cell carcinoma at [facility], discharged on 1014. Readmitted here with UTI. Massive subcutaneous emphysema was found on CAT scan and patient was transferred to [facility]. The patient and his wife report that they were told "the infection went to the bloodstream". He has been given ciprofloxacin for 10 more days. He was discharged yesterday. I have no records regarding his most recent stay at [facility]. He has no pain currently and thinks that his symptoms were related to Cipro. He will not take any further. He has never had a cardiac workup. Initial troponin and EKG are unremarkable. Patient is requesting discharge, and will followup as an outpatient. His only known cardiac risk factor is family history. His father reportedly died of a heart attack in his 54s. He is a previous smoker. He reports some exertional discomfort similarly, but had no problems prior to surgery. He had a CT angiogram during previous hospitalization here which was negative for PE. He had an elevated d-dimer today and a VQ scan was done. The study is negative.  Past Medical History  Diagnosis Date  . Gross hematuria   . C. difficile enteritis 07/04/2011  . Diverticulitis   . Bladder cancer 01/07/2012    Transitional  Cell Carcinoma, s/p bladder, prostate and lymph node resection with ileal conduit at [facility] on 10/9, discharged on 10/14   . Subcutaneous emphysema, postoperative 01/07/2012  . Acute pyelonephritis 01/07/2012  . Cirrhosis 01/07/2012    Radiographically  . Splenomegaly     Meds: Prescriptions prior to admission  Medication Sig Dispense Refill  . acetaminophen (TYLENOL) 325 MG tablet Take 2 tablets (650 mg total) by mouth every 6 (six) hours as needed (or Fever >/= 101).      Marland Kitchen ALPRAZolam (XANAX) 1 MG tablet Take 0.5 mg by mouth 2 (two) times daily as needed. anxiety      . ciprofloxacin (CIPRO) 750 MG tablet Take 750 mg by mouth daily.      Marland Kitchen docusate sodium (COLACE) 100 MG capsule Take 100 mg by mouth 2 (two) times daily as needed. Constipation      . HYDROcodone-acetaminophen (NORCO/VICODIN) 5-325 MG per tablet Take 1 tablet by mouth every 6 (six) hours as needed.      . traZODone (DESYREL) 50 MG tablet Take 50 mg by mouth at bedtime as needed. Sleep        Allergies: Review of patient's allergies indicates no known allergies. History   Social History  . Marital Status: Married    Spouse Name: N/A    Number of Children: N/A  . Years of Education: N/A   Occupational History  . construction     unemployed since 2008   Social History Main Topics  . Smoking status: Former Smoker    Quit date: 12/03/2007  . Smokeless tobacco: Not on file  . Alcohol Use: No  .  Drug Use: No  . Sexually Active: Yes   Other Topics Concern  . Not on file   Social History Narrative  . No narrative on file   Family History  Problem Relation Age of Onset  . Heart failure Father   . Hypertension Mother    Past Surgical History  Procedure Date  . Elbow surgery   . Transurethral resection of bladder tumor 10/26/2011    Procedure: TRANSURETHRAL RESECTION OF BLADDER TUMOR (TURBT);  Surgeon: Ky Barban, MD;  Location: [facility];  Service: Urology;  Laterality: N/A;  . Transurethral resection  of prostate 10/26/2011    Procedure: TRANSURETHRAL RESECTION OF THE PROSTATE (TURP);  Surgeon: Ky Barban, MD;  Location: [facility];  Service: Urology;  Laterality: N/A;    Review of Systems: Systems reviewed and as per HPI, otherwise negative.  Physical Exam: Blood pressure 144/74, pulse 74, temperature 98.3 F (36.8 C), temperature source Oral, resp. rate 20, height 6\' 1"  (1.854 m), weight 94.1 kg (207 lb 7.3 oz), SpO2 97.00%. BP 142/83  Pulse 64  Temp 98.2 F (36.8 C) (Oral)  Resp 20  Ht 6\' 1"  (1.854 m)  Wt 94.1 kg (207 lb 7.3 oz)  BMI 27.37 kg/m2  SpO2 97%  General Appearance:    Alert, cooperative, no distress, appears stated age  Head:    Normocephalic, without obvious abnormality, atraumatic  Eyes:    PERRL, conjunctiva/corneas clear, EOM's intact, fundi    benign, both eyes       Ears:    Normal TM's and external ear canals, both ears  Nose:   Nares normal, septum midline, mucosa normal, no drainage    or sinus tenderness  Throat:   Lips, mucosa, and tongue normal; teeth and gums normal  Neck:   Supple, symmetrical, trachea midline, no adenopathy;       thyroid:  No enlargement/tenderness/nodules; no carotid   bruit or JVD  Back:     Symmetric, no curvature, ROM normal, no CVA tenderness  Lungs:     Clear to auscultation bilaterally, respirations unlabored  Chest wall:    No tenderness or deformity  Heart:    Regular rate and rhythm, S1 and S2 normal, no murmur, rub   or gallop  Abdomen:     Soft, non-tender, healing surgical incisions without signs of infection. Stoma in the right lower Kocher and pink, clear yellow liquid in the bag.   Genitalia:   deferred   Rectal:   deferred   Extremities:   Extremities normal, atraumatic, no cyanosis or edema  Pulses:   2+ and symmetric all extremities  Skin:   Skin color, texture, turgor normal, no rashes or lesions.  no subcutaneous emphysema noted   Lymph nodes:   Cervical, supraclavicular, and axillary nodes normal    Neurologic:   CNII-XII intact. Normal strength, sensation and reflexes      throughout    Psychiatric: Normal affect. Calm and cooperative.  Lab results: Basic Metabolic Panel:  Basename 01/12/12 1359  NA 142  K 4.2  CL 106  CO2 24  GLUCOSE 86  BUN 22  CREATININE 1.55*  CALCIUM 9.3  MG --  PHOS --   Liver Function Tests: No results found for this basename: AST:2,ALT:2,ALKPHOS:2,BILITOT:2,PROT:2,ALBUMIN:2 in the last 72 hours No results found for this basename: LIPASE:2,AMYLASE:2 in the last 72 hours No results found for this basename: AMMONIA:2 in the last 72 hours CBC:  Basename 01/12/12 1359  WBC 7.9  NEUTROABS 4.9  HGB  12.5*  HCT 37.7*  MCV 79.5  PLT 547*   Cardiac Enzymes:  Basename 01/12/12 2031 01/12/12 1756 01/12/12 1359  CKTOTAL -- -- --  CKMB -- -- --  CKMBINDEX -- -- --  TROPONINI <0.30 <0.30 <0.30   BNP: No results found for this basename: PROBNP:3 in the last 72 hours D-Dimer:  Basename 01/12/12 1359  DDIMER 2.29*   CBG: No results found for this basename: GLUCAP:6 in the last 72 hours Hemoglobin A1C: No results found for this basename: HGBA1C in the last 72 hours Fasting Lipid Panel: No results found for this basename: CHOL,HDL,LDLCALC,TRIG,CHOLHDL,LDLDIRECT in the last 72 hours Thyroid Function Tests: No results found for this basename: TSH,T4TOTAL,FREET4,T3FREE,THYROIDAB in the last 72 hours Anemia Panel: No results found for this basename: VITAMINB12,FOLATE,FERRITIN,TIBC,IRON,RETICCTPCT in the last 72 hours Coagulation: No results found for this basename: LABPROT:2,INR:2 in the last 72 hours Urine Drug Screen: Drugs of Abuse  No results found for this basename: labopia, cocainscrnur, labbenz, amphetmu, thcu, labbarb    Alcohol Level: No results found for this basename: ETH:2 in the last 72 hours Urinalysis: No results found for this basename:  COLORURINE:2,APPERANCEUR:2,LABSPEC:2,PHURINE:2,GLUCOSEU:2,HGBUR:2,BILIRUBINUR:2,KETONESUR:2,PROTEINUR:2,UROBILINOGEN:2,NITRITE:2,LEUKOCYTESUR:2 in the last 72 hours  Imaging results:  Nm Pulmonary Perf And Vent  01/12/2012  *RADIOLOGY REPORT*  Clinical Data: Short of breath.  Possible pulmonary embolism.  NM PULMONARY VENTILATION AND PERFUSION SCAN  Radiopharmaceutical: CURIE MAA TECHNETIUM TO 55M ALBUMIN AGGREGATED , 35 mCi technetium 72m DTPA  Comparison: Chest radiography same day  Findings: Perfusion study is normal.  Ventilation study is normal.  IMPRESSION: Normal VQ scan.   Original Report Authenticated By: Thomasenia Sales, M.D.    Dg Chest Port 1 View  01/12/2012  *RADIOLOGY REPORT*  Clinical Data: Chest pains after starting new medication.  PORTABLE CHEST - 1 VIEW  Comparison: 01/07/2012 CT and chest x-ray.  Findings: The CT detected subcutaneous emphysema is not appreciated on the present plain film examination.  No gross pneumothorax.  CT detected pulmonary nodules not appreciate on present plain film examination.  Left base subsegmental atelectasis.  Central pulmonary vascular prominence greater on the right stable.  No segmental consolidation or pulmonary edema.  IMPRESSION: The CT detected subcutaneous emphysema is not appreciated on the present plain film examination.  No gross pneumothorax.  CT detected pulmonary nodules not appreciate on present plain film examination.  Left base subsegmental atelectasis.  Central pulmonary vascular prominence greater on the right stable.  No segmental consolidation or pulmonary edema.   Original Report Authenticated By: Fuller Canada, M.D.     Assessment & Plan: Principal Problem:  *Chest pain, atypical. Suspect GI in etiology. May be related to Cipro. Patient has never had a cardiac workup however. Will observe overnight, rule out MI. Likely arrange an outpatient stress test if stable. However, need to clarify the situation with antibiotics.  I will asked for records from [facility]. For now, place patient on Ceftin. From patient's description, he may have been bacteremic. May not be wise to stop antibiotics altogether. Active Problems:  Bladder cancer  Cirrhosis, denies alcohol use, diagnosed radiographically.  Renal insufficiency   Tashina Credit L 01/12/2012, 9:43 PM

## 2012-01-12 NOTE — ED Provider Notes (Addendum)
History  This chart was scribed for Derwood Kaplan, MD by Shari Heritage. The patient was seen in room APA16A/APA16A. Patient's care was started at 1340.     CSN: [id number]  Arrival date & time 01/12/12  1308   First MD Initiated Contact with Patient 01/12/12 1340      Chief Complaint  Patient presents with  . Chest Pain    Patient is a 56 y.o. male presenting with chest pain. The history is provided by the patient. No language interpreter was used.  Chest Pain The chest pain began yesterday. Chest pain occurs constantly. The chest pain is unchanged. The severity of the pain is moderate. The quality of the pain is described as heavy. The pain does not radiate. Pertinent negatives for primary symptoms include no shortness of breath, no nausea and no vomiting. He tried nothing for the symptoms.    HPI Comments: [name] is a 56 y.o. male who presents to the Emergency Department complaining of heavy, moderate, constant chest pain onset yesterday night. Patient denies SOB, nausea, vomiting, radiating arm pain. Patient is in the middle of a course of Cipro that was prescribed by a physician at [facility] following surgery. He says that the pain began after taking the first dosage of the pills yesterday. He also took one today. Patient reports no aggravating or relieving factors. He denies diabetes, HTN, or cardiac disease. He has a medical history of bladder cancer, acute pyelonephritis, cirrhosis, splenomegaly and transurethral resection of bladder and prostate (October 2013).  Past Medical History  Diagnosis Date  . Gross hematuria   . C. difficile enteritis 07/04/2011  . Diverticulitis   . Bladder cancer 01/07/2012    Transitional Cell Carcinoma, s/p bladder, prostate and lymph node resection with ileal conduit at [facility] on 10/9, discharged on 10/14   . Subcutaneous emphysema, postoperative 01/07/2012  . Acute pyelonephritis 01/07/2012  . Cirrhosis 01/07/2012   Radiographically  . Splenomegaly     Past Surgical History  Procedure Date  . Elbow surgery   . Transurethral resection of bladder tumor 10/26/2011    Procedure: TRANSURETHRAL RESECTION OF BLADDER TUMOR (TURBT);  Surgeon: Ky Barban, MD;  Location: [facility];  Service: Urology;  Laterality: N/A;  . Transurethral resection of prostate 10/26/2011    Procedure: TRANSURETHRAL RESECTION OF THE PROSTATE (TURP);  Surgeon: Ky Barban, MD;  Location: [facility];  Service: Urology;  Laterality: N/A;    Family History  Problem Relation Age of Onset  . Heart failure Father   . Hypertension Mother     History  Substance Use Topics  . Smoking status: Former Smoker    Quit date: 12/03/2007  . Smokeless tobacco: Not on file  . Alcohol Use: No      Review of Systems  Respiratory: Negative for shortness of breath.   Cardiovascular: Positive for chest pain.  Gastrointestinal: Negative for nausea and vomiting.  All other systems reviewed and are negative.    Allergies  Review of patient's allergies indicates no known allergies.  Home Medications   Current Outpatient Rx  Name Route Sig Dispense Refill  . ACETAMINOPHEN 325 MG PO TABS Oral Take 2 tablets (650 mg total) by mouth every 6 (six) hours as needed (or Fever >/= 101).    Marland Kitchen ALPRAZOLAM 1 MG PO TABS Oral Take 0.5 mg by mouth 2 (two) times daily.    Marland Kitchen DOCUSATE SODIUM 100 MG PO CAPS Oral Take 100 mg by mouth 2 (two) times daily as  needed. Constipation    . ENOXAPARIN SODIUM 40 MG/0.4ML [place] SOLN Subcutaneous Inject 0.4 mLs (40 mg total) into the skin daily. 0 Syringe   . HYDROCODONE-ACETAMINOPHEN 5-325 MG PO TABS Oral Take 1 tablet by mouth every 6 (six) hours as needed.    Marland Kitchen PANTOPRAZOLE SODIUM 40 MG PO TBEC Oral Take 1 tablet (40 mg total) by mouth daily.    Marland Kitchen PIPERACILLIN-TAZOBACTAM 3.375 G IVPB Intravenous Inject 50 mLs (3.375 g total) into the vein every 8 (eight) hours. 50 mL   . VANCOMYCIN 1250 MG IVPB Intravenous Inject  1,250 mg into the vein every 12 (twelve) hours.    . TRAZODONE HCL 50 MG PO TABS Oral Take 50 mg by mouth at bedtime as needed. Sleep      BP 150/100  Pulse 76  Temp 98 F (36.7 C) (Oral)  Resp 20  Ht 6\' 1"  (1.854 m)  Wt 216 lb (97.977 kg)  BMI 28.50 kg/m2  SpO2 100%  Physical Exam  Constitutional: He is oriented to person, place, and time. He appears well-developed and well-nourished.  HENT:  Head: Normocephalic and atraumatic.  Neck: JVD (3-5 cm) present.  Cardiovascular: Normal rate and regular rhythm.   No murmur heard. Pulmonary/Chest: Effort normal and breath sounds normal. No respiratory distress. He has no wheezes. He has no rales.  Abdominal: Soft.       Surgical incision wound that's healing.   Musculoskeletal: Normal range of motion.       No pitting edema. No unilateral calf tenderness or swelling.  Neurological: He is alert and oriented to person, place, and time.  Skin: Skin is warm and dry.  Psychiatric: He has a normal mood and affect. His behavior is normal.    ED Course  Procedures (including critical care time) DIAGNOSTIC STUDIES: Oxygen Saturation is 100% on room air, normal by my interpretation.    COORDINATION OF CARE: 1:59pm- Patient informed of current plan for treatment and evaluation and agrees with plan at this time.   Results for orders placed during the hospital encounter of 01/12/12  BASIC METABOLIC PANEL      Component Value Range   Sodium 142  135 - 145 mEq/L   Potassium 4.2  3.5 - 5.1 mEq/L   Chloride 106  96 - 112 mEq/L   CO2 24  19 - 32 mEq/L   Glucose, Bld 86  70 - 99 mg/dL   BUN 22  6 - 23 mg/dL   Creatinine, Ser 1.61 (*) 0.50 - 1.35 mg/dL   Calcium 9.3  8.4 - 09.6 mg/dL   GFR calc non Af Amer 48 (*) >90 mL/min   GFR calc Af Amer 56 (*) >90 mL/min  CBC WITH DIFFERENTIAL      Component Value Range   WBC 7.9  4.0 - 10.5 K/uL   RBC 4.74  4.22 - 5.81 MIL/uL   Hemoglobin 12.5 (*) 13.0 - 17.0 g/dL   HCT 04.5 (*) 40.9 - 81.1 %    MCV 79.5  78.0 - 100.0 fL   MCH 26.4  26.0 - 34.0 pg   MCHC 33.2  30.0 - 36.0 g/dL   RDW 91.4  78.2 - 95.6 %   Platelets 547 (*) 150 - 400 K/uL   Neutrophils Relative 62  43 - 77 %   Neutro Abs 4.9  1.7 - 7.7 K/uL   Lymphocytes Relative 24  12 - 46 %   Lymphs Abs 1.9  0.7 - 4.0 K/uL   Monocytes  Relative 10  3 - 12 %   Monocytes Absolute 0.8  0.1 - 1.0 K/uL   Eosinophils Relative 3  0 - 5 %   Eosinophils Absolute 0.3  0.0 - 0.7 K/uL   Basophils Relative 1  0 - 1 %   Basophils Absolute 0.1  0.0 - 0.1 K/uL  TROPONIN I      Component Value Range   Troponin I <0.30  <0.30 ng/mL    Dg Chest Port 1 View  01/12/2012  *RADIOLOGY REPORT*  Clinical Data: Chest pains after starting new medication.  PORTABLE CHEST - 1 VIEW  Comparison: 01/07/2012 CT and chest x-ray.  Findings: The CT detected subcutaneous emphysema is not appreciated on the present plain film examination.  No gross pneumothorax.  CT detected pulmonary nodules not appreciate on present plain film examination.  Left base subsegmental atelectasis.  Central pulmonary vascular prominence greater on the right stable.  No segmental consolidation or pulmonary edema.  IMPRESSION: The CT detected subcutaneous emphysema is not appreciated on the present plain film examination.  No gross pneumothorax.  CT detected pulmonary nodules not appreciate on present plain film examination.  Left base subsegmental atelectasis.  Central pulmonary vascular prominence greater on the right stable.  No segmental consolidation or pulmonary edema.   Original Report Authenticated By: Fuller Canada, M.D.      No diagnosis found.    MDM  Medical screening examination/treatment/procedure(s) were performed by me as the supervising physician. Scribe service was utilized for documentation only.  Differential diagnosis includes: ACS syndrome CHF exacerbation Valvular disorder Myocarditis Pericarditis Pericardial effusion Pneumonia Pleural  effusion Pulmonary edema PE Anemia Musculoskeletal pain  Pt comes in with cc of chest pain. Pt has hx of cancer, had recent surgery earlier this month, and started having chest pain, midsternal, pressure like this afternoon. The pain has been constant, and there is no specific aggravating or relieving factors, nor is there any concerning constitutionals.  Pt is at obvious risk for PE, although he has had negative workup in the past. Pt has no cardiac risk factors besides age, smoking.  The pain is more typical for cardiac pathology. Will give nitro and reassess. Will have to check dimer as well, he is intermediate risk. Pt started cipro y'day, and thinks the pain is a side effect - i think that is less likely given the exam.    Date: 01/12/2012  Rate:79  Rhythm: normal sinus rhythm  QRS Axis: left  Intervals: normal  ST/T Wave abnormalities: nonspecific T wave changes  Conduction Disutrbances:none  Narrative Interpretation:   Old EKG Reviewed: unchanged    Derwood Kaplan, MD 01/12/12 1555  Dimer is elevated. VQ scan is ordered. Dr. Lendell Caprice from the Hospitalist was consulted for admission. She will independently evaluate the patient is the v/q scan is negative for possible admission.  Derwood Kaplan, MD 01/12/12 [id number]

## 2012-01-12 NOTE — ED Notes (Signed)
C/o chest heaviness, states it is a reaction to Cipro

## 2012-01-13 DIAGNOSIS — N289 Disorder of kidney and ureter, unspecified: Secondary | ICD-10-CM

## 2012-01-13 MED ORDER — CEFUROXIME AXETIL 500 MG PO TABS: 500.0000 mg | ORAL_TABLET | Freq: Two times a day (BID) | ORAL | Status: DC

## 2012-01-13 MED ORDER — OMEPRAZOLE 40 MG PO CPDR: 40.0000 mg | DELAYED_RELEASE_CAPSULE | Freq: Every day | ORAL | Status: DC

## 2012-01-13 NOTE — Progress Notes (Signed)
INITIAL ADULT NUTRITION ASSESSMENT Date: 01/13/2012   Time: 12:02 PM Reason for Assessment: Weight loss  ASSESSMENT: Male 56 y.o.  Dx: Chest pain   Past Medical History  Diagnosis Date  . Gross hematuria   . C. difficile enteritis 07/04/2011  . Diverticulitis   . Bladder cancer 01/07/2012    Transitional Cell Carcinoma, s/p bladder, prostate and lymph node resection with ileal conduit at [facility] on 10/9, discharged on 10/14   . Subcutaneous emphysema, postoperative 01/07/2012  . Acute pyelonephritis 01/07/2012  . Cirrhosis 01/07/2012    Radiographically  . Splenomegaly     Scheduled Meds:   . aspirin  324 mg Oral Once  . aspirin EC  81 mg Oral Daily  . cefUROXime  500 mg Oral BID WC  . docusate sodium  100 mg Oral BID  . heparin  5,000 Units Subcutaneous Q8H  . pneumococcal 23 valent vaccine  0.5 mL Intramuscular Tomorrow-1000  . sodium chloride  3 mL Intravenous Q12H   Continuous Infusions:  PRN Meds:.acetaminophen, ALPRAZolam, alum & mag hydroxide-simeth, HYDROcodone-acetaminophen, nitroGLYCERIN, ondansetron (ZOFRAN) IV, ondansetron, technetium albumin aggregated, technetium TC 19M diethylenetriame-pentaacetic acid, traZODone  Ht: 6\' 1"  (185.4 cm)  Wt: 207 lb 7.3 oz (94.1 kg)  Ideal Wt:  79.9 kg % Ideal Wt: 117%  Usual Wt:235# % Usual Wt: 88%  Body mass index is 27.37 kg/(m^2). Overweight  Food/Nutrition Related Hx: Pt s/p surgery at [facility] 10/9. He reports severe wt loss of 28#, 12% this month r/t being NPO before and after surgery and following once diet advanced c/o early satiety. He is tolerating a Regular diet and ate all of breakfast this a.m. Except the oatmeal. He says he feels his appetite is much improved and will be able now to maintain wt stability. We reviewed General Healthy diet and in particular identified  protein food choices, and encouraged consistent balanced meal intake. He is being d/c home today per MD.   CMP     Component Value Date/Time     NA 142 01/12/2012 1359   K 4.2 01/12/2012 1359   CL 106 01/12/2012 1359   CO2 24 01/12/2012 1359   GLUCOSE 86 01/12/2012 1359   BUN 22 01/12/2012 1359   CREATININE 1.55* 01/12/2012 1359   CALCIUM 9.3 01/12/2012 1359   PROT 5.6* 01/07/2012 0711   ALBUMIN 2.3* 01/07/2012 0711   AST 42* 01/07/2012 0711   ALT 36 01/07/2012 0711   ALKPHOS 126* 01/07/2012 0711   BILITOT 0.6 01/07/2012 0711   GFRNONAA 48* 01/12/2012 1359   GFRAA 56* 01/12/2012 1359    Intake/Output Summary (Last 24 hours) at 01/13/12 1205 Last data filed at 01/13/12 0830  Gross per 24 hour  Intake    840 ml  Output   2250 ml  Net  -1410 ml     Diet Order: General Regular diet  Supplements/Tube Feeding:none at this time  IVF:    Estimated Nutritional Needs:   Kcal:2350-2532 kcal Protein:103-122 gr  Fluid:1 ml/kcal  NUTRITION DIAGNOSIS: -Inadequate oral intake (NI-2.1).  Status: Ongoing  RELATED TO: transitional cell carcinoma  AS EVIDENCE BY: severe wt loss  and recent hosptialization s/p bladder, prostate, lymph node resection with ileal conduit on 10/9 at [facility] d/c on 10/14.  Goal: provide general healthy diet education to promote wt stability and maintenance and prevent further wt loss. Education Completed.   EDUCATION NEEDS: -Education needs addressed r/t general healthy diet which included ideas for lean protein foods and portion recommendations.   INTERVENTION: Education completed  as noted above; expect good adherence.  Dietitian [phone]  DOCUMENTATION CODES Per approved criteria  -Not Applicable    [name] 01/13/2012, 12:02 PM

## 2012-01-13 NOTE — Plan of Care (Signed)
Problem: Consults Goal: Chest Pain Patient Education (See Patient Education module for education specifics.) Outcome: Progressing Patient stated CP brought on by large dose of antibiotic.  On 2nd day of antibiotic chest pain started 1/2 hr after taking 750mg  Cipro PO Goal: Nutrition Consult-if indicated Outcome: Progressing Patient has been losing weight since surgery 12/22/11 at [facility].  He and wife were very surprised to find he has lost 9 lbs since then  Problem: Phase I Progression Outcomes Goal: Anginal pain relieved Outcome: Progressing No pain on admission Goal: Aspirin unless contraindicated Outcome: Completed/Met Date Met:  01/13/12 ASA 324mg  po given Goal: MD aware of Cardiac Marker results Outcome: Progressing Troponins remain negative Goal: Voiding-avoid urinary catheter unless indicated Outcome: Progressing Patient with conduit/ostomy since surgery attached to foley bag to gravity

## 2012-01-13 NOTE — Discharge Summary (Signed)
Physician Discharge Summary  Patient ID: [name] MRN: [id number] DOB/AGE: 19-Jan-1956 56 y.o.  Admit date: 01/12/2012 Discharge date: 01/13/2012  Discharge Diagnoses:  Principal Problem:  *Chest pain, suspect GI etiology. Active Problems:  Bladder cancer  Renal insufficiency Recent admission for pyelonephritis and bacteremia, Klebsiella and Escherichia coli.    Medication List     As of 01/13/2012 11:55 AM    STOP taking these medications         ciprofloxacin 750 MG tablet   Commonly known as: CIPRO      TAKE these medications         acetaminophen 325 MG tablet   Commonly known as: TYLENOL   Take 2 tablets (650 mg total) by mouth every 6 (six) hours as needed (or Fever >/= 101).      ALPRAZolam 1 MG tablet   Commonly known as: XANAX   Take 0.5 mg by mouth 2 (two) times daily as needed. anxiety      cefUROXime 500 MG tablet   Commonly known as: CEFTIN   Take 1 tablet (500 mg total) by mouth 2 (two) times daily with a meal.      docusate sodium 100 MG capsule   Commonly known as: COLACE   Take 100 mg by mouth 2 (two) times daily as needed. Constipation      HYDROcodone-acetaminophen 5-325 MG per tablet   Commonly known as: NORCO/VICODIN   Take 1 tablet by mouth every 6 (six) hours as needed.      traZODone 50 MG tablet   Commonly known as: DESYREL   Take 50 mg by mouth at bedtime as needed. Sleep            Discharge Orders    Future Orders Please Complete By Expires   Diet general      Activity as tolerated - No restrictions         Follow-up Information    Follow up with [facility].         Disposition: home  Discharged Condition: stable  Consults:  none  Labs:   Results for orders placed during the hospital encounter of 01/12/12 (from the past 48 hour(s))  BASIC METABOLIC PANEL     Status: Abnormal   Collection Time   01/12/12  1:59 PM      Component Value Range Comment   Sodium 142  135 - 145 mEq/L    Potassium 4.2   3.5 - 5.1 mEq/L    Chloride 106  96 - 112 mEq/L    CO2 24  19 - 32 mEq/L    Glucose, Bld 86  70 - 99 mg/dL    BUN 22  6 - 23 mg/dL    Creatinine, Ser 4.65 (*) 0.50 - 1.35 mg/dL    Calcium 9.3  8.4 - 03.5 mg/dL    GFR calc non Af Amer 48 (*) >90 mL/min    GFR calc Af Amer 56 (*) >90 mL/min   CBC WITH DIFFERENTIAL     Status: Abnormal   Collection Time   01/12/12  1:59 PM      Component Value Range Comment   WBC 7.9  4.0 - 10.5 K/uL    RBC 4.74  4.22 - 5.81 MIL/uL    Hemoglobin 12.5 (*) 13.0 - 17.0 g/dL    HCT 46.5 (*) 68.1 - 52.0 %    MCV 79.5  78.0 - 100.0 fL    MCH 26.4  26.0 - 34.0 pg  MCHC 33.2  30.0 - 36.0 g/dL    RDW 16.1  09.6 - 04.5 %    Platelets 547 (*) 150 - 400 K/uL    Neutrophils Relative 62  43 - 77 %    Neutro Abs 4.9  1.7 - 7.7 K/uL    Lymphocytes Relative 24  12 - 46 %    Lymphs Abs 1.9  0.7 - 4.0 K/uL    Monocytes Relative 10  3 - 12 %    Monocytes Absolute 0.8  0.1 - 1.0 K/uL    Eosinophils Relative 3  0 - 5 %    Eosinophils Absolute 0.3  0.0 - 0.7 K/uL    Basophils Relative 1  0 - 1 %    Basophils Absolute 0.1  0.0 - 0.1 K/uL   TROPONIN I     Status: Normal   Collection Time   01/12/12  1:59 PM      Component Value Range Comment   Troponin I <0.30  <0.30 ng/mL   D-DIMER, QUANTITATIVE     Status: Abnormal   Collection Time   01/12/12  1:59 PM      Component Value Range Comment   D-Dimer, Quant 2.29 (*) 0.00 - 0.48 ug/mL-FEU   TROPONIN I     Status: Normal   Collection Time   01/12/12  5:56 PM      Component Value Range Comment   Troponin I <0.30  <0.30 ng/mL   MRSA PCR SCREENING     Status: Normal   Collection Time   01/12/12  8:25 PM      Component Value Range Comment   MRSA by PCR NEGATIVE  NEGATIVE   TROPONIN I     Status: Normal   Collection Time   01/12/12  8:31 PM      Component Value Range Comment   Troponin I <0.30  <0.30 ng/mL   TROPONIN I     Status: Normal   Collection Time   01/13/12  2:14 AM      Component Value Range  Comment   Troponin I <0.30  <0.30 ng/mL     Diagnostics:  Dg Chest 2 View  01/07/2012  *RADIOLOGY REPORT*  Clinical Data: Chest pain, shortness of breath. Bladder mass.  CHEST - 2 VIEW  Comparison: 07/02/2011 abdominal CT  Findings: Heart size upper normal.  Right hilar prominence.  Mild lung base opacities.  There is nonspecific subcutaneous gas bilaterally.  No acute osseous finding.  IMPRESSION: Right hilar prominence may correspond to prominent  pulmonary arterial branches.  However, in the setting of a known primary malignancy, excluding adenopathy is reasonable.  I do not see a prior chest CT in our system.  Nonspecific chest wall subcutaneous gas bilaterally.  Mild bibasilar atelectasis.     Original Report Authenticated By: Waneta Martins, M.D.    Ct Angio Chest Pe W/cm &/or Wo Cm  01/07/2012  *RADIOLOGY REPORT*  Clinical Data:  Fever, shortness of breath, indigestion, epigastric pain, flank pain, right ears, night sweats, 3 weeks post cystoprostatectomy secondary to bladder cancer  CT ANGIOGRAPHY CHEST CT ABDOMEN AND PELVIS WITH CONTRAST  Technique:  Multidetector CT imaging of the chest was performed using the standard protocol during bolus administration of intravenous contrast.  Multiplanar CT image reconstructions including MIPs were obtained to evaluate the vascular anatomy. Multidetector CT imaging of the abdomen and pelvis was performed using the standard protocol during bolus administration of intravenous contrast.  Contrast:  100 ml Omnipaque 350  IV. Dilute oral contrast.  Comparison:  CT abdomen and pelvis 07/03/2013and 08/10/2005  CTA CHEST  Findings: Aorta normal caliber without aneurysm or dissection. Pulmonary arteries patent. No evidence of pulmonary embolism. No thoracic adenopathy. Dependent atelectasis in bilateral lower lobes. 3 mm diameter right middle lobe nodule image 66. 5 mm left lower lobe nodule image 70 and adjacent 3 mm nodule image 71. No additional pulmonary mass,  nodule, infiltrate, pleural effusion, or pneumothorax. Extensive subcutaneous edema identified throughout the chest wall bilaterally extending supraclavicular. No acute osseous findings.  Review of the MIP images confirms the above findings.  IMPRESSION: No evidence of pulmonary embolism. Dependent atelectasis bilateral lower lobes. Bilateral pulmonary nodules; the right middle lobe nodule is unchanged since 09/15/2011 while the left lower lobe pulmonary nodules are unchanged since 08/10/2005. Extensive subcutaneous emphysema throughout chest extending supraclavicular, uncertain etiology, see below.  CT ABDOMEN AND PELVIS  Findings: Extensive subcutaneous edema extending from the chest throughout the abdomen anteriorly in and bilateral flanks into the pelvis and inguinal regions.  Small focus of gas is also seen within the right rectus muscle image 68. No definite free intraperitoneal air. Hepatic margins appear minimally irregular, cannot exclude cirrhosis. Interval increase in size of caudate lobe since 2007. Splenomegaly, spleen 15.3 x 7.6 x 13.9 cm. No definite focal abnormalities of the liver, spleen, pancreas, or adrenal glands. Bilateral hydronephrosis with perinephric edema at right kidney.  Bilateral ureteral stents are visualized extending through the nondilated ureters to a urostomy in the right mid abdomen. Delayed right nephrogram versus left with patchy appearance of right nephrogram highly suspicious for pyelonephritis. Normal appendix. Stomach and bowel loops otherwise unremarkable. Bladder and prostate gland surgically absent without the abnormal fluid collection, mass or adenopathy at the surgical bed. Significant facet degenerative changes lower lumbar spine. No osseous metastases identified.  IMPRESSION: Post cystoprostatectomy with bilateral hydronephrosis. Abnormal right nephrogram with perinephric edema highly suspicious for pyelonephritis. Extensive subcutaneous emphysema from the  supraclavicular region to the inguinal regions bilaterally, uncertain etiology. This could be the result of a barotrauma, residual from surgery though atypical to persist 3 weeks postoperative; do not see definitive pneumothorax or pneumomediastinum to account for finding. Subcutaneous infection by gas forming organism is also a consideration though there is a lack of associated skin thickening and subcutaneous edema.  Findings called to Dr. Sherrie Mustache on 01/07/2012 at 1000 hours.   Original Report Authenticated By: Lollie Marrow, M.D.    Ct Abdomen Pelvis W Contrast  01/07/2012  *RADIOLOGY REPORT*  Clinical Data:  Fever, shortness of breath, indigestion, epigastric pain, flank pain, right ears, night sweats, 3 weeks post cystoprostatectomy secondary to bladder cancer  CT ANGIOGRAPHY CHEST CT ABDOMEN AND PELVIS WITH CONTRAST  Technique:  Multidetector CT imaging of the chest was performed using the standard protocol during bolus administration of intravenous contrast.  Multiplanar CT image reconstructions including MIPs were obtained to evaluate the vascular anatomy. Multidetector CT imaging of the abdomen and pelvis was performed using the standard protocol during bolus administration of intravenous contrast.  Contrast:  100 ml Omnipaque 350 IV. Dilute oral contrast.  Comparison:  CT abdomen and pelvis 07/03/2013and 08/10/2005  CTA CHEST  Findings: Aorta normal caliber without aneurysm or dissection. Pulmonary arteries patent. No evidence of pulmonary embolism. No thoracic adenopathy. Dependent atelectasis in bilateral lower lobes. 3 mm diameter right middle lobe nodule image 66. 5 mm left lower lobe nodule image 70 and adjacent 3 mm nodule image 71. No additional pulmonary mass, nodule, infiltrate, pleural effusion,  or pneumothorax. Extensive subcutaneous edema identified throughout the chest wall bilaterally extending supraclavicular. No acute osseous findings.  Review of the MIP images confirms the above  findings.  IMPRESSION: No evidence of pulmonary embolism. Dependent atelectasis bilateral lower lobes. Bilateral pulmonary nodules; the right middle lobe nodule is unchanged since 09/15/2011 while the left lower lobe pulmonary nodules are unchanged since 08/10/2005. Extensive subcutaneous emphysema throughout chest extending supraclavicular, uncertain etiology, see below.  CT ABDOMEN AND PELVIS  Findings: Extensive subcutaneous edema extending from the chest throughout the abdomen anteriorly in and bilateral flanks into the pelvis and inguinal regions.  Small focus of gas is also seen within the right rectus muscle image 68. No definite free intraperitoneal air. Hepatic margins appear minimally irregular, cannot exclude cirrhosis. Interval increase in size of caudate lobe since 2007. Splenomegaly, spleen 15.3 x 7.6 x 13.9 cm. No definite focal abnormalities of the liver, spleen, pancreas, or adrenal glands. Bilateral hydronephrosis with perinephric edema at right kidney.  Bilateral ureteral stents are visualized extending through the nondilated ureters to a urostomy in the right mid abdomen. Delayed right nephrogram versus left with patchy appearance of right nephrogram highly suspicious for pyelonephritis. Normal appendix. Stomach and bowel loops otherwise unremarkable. Bladder and prostate gland surgically absent without the abnormal fluid collection, mass or adenopathy at the surgical bed. Significant facet degenerative changes lower lumbar spine. No osseous metastases identified.  IMPRESSION: Post cystoprostatectomy with bilateral hydronephrosis. Abnormal right nephrogram with perinephric edema highly suspicious for pyelonephritis. Extensive subcutaneous emphysema from the supraclavicular region to the inguinal regions bilaterally, uncertain etiology. This could be the result of a barotrauma, residual from surgery though atypical to persist 3 weeks postoperative; do not see definitive pneumothorax or  pneumomediastinum to account for finding. Subcutaneous infection by gas forming organism is also a consideration though there is a lack of associated skin thickening and subcutaneous edema.  Findings called to Dr. Sherrie Mustache on 01/07/2012 at 1000 hours.   Original Report Authenticated By: Lollie Marrow, M.D.    Nm Pulmonary Perf And Vent  01/12/2012  *RADIOLOGY REPORT*  Clinical Data: Short of breath.  Possible pulmonary embolism.  NM PULMONARY VENTILATION AND PERFUSION SCAN  Radiopharmaceutical: CURIE MAA TECHNETIUM TO 42M ALBUMIN AGGREGATED , 35 mCi technetium 44m DTPA  Comparison: Chest radiography same day  Findings: Perfusion study is normal.  Ventilation study is normal.  IMPRESSION: Normal VQ scan.   Original Report Authenticated By: Thomasenia Sales, M.D.    Dg Chest Port 1 View  01/12/2012  *RADIOLOGY REPORT*  Clinical Data: Chest pains after starting new medication.  PORTABLE CHEST - 1 VIEW  Comparison: 01/07/2012 CT and chest x-ray.  Findings: The CT detected subcutaneous emphysema is not appreciated on the present plain film examination.  No gross pneumothorax.  CT detected pulmonary nodules not appreciate on present plain film examination.  Left base subsegmental atelectasis.  Central pulmonary vascular prominence greater on the right stable.  No segmental consolidation or pulmonary edema.  IMPRESSION: The CT detected subcutaneous emphysema is not appreciated on the present plain film examination.  No gross pneumothorax.  CT detected pulmonary nodules not appreciate on present plain film examination.  Left base subsegmental atelectasis.  Central pulmonary vascular prominence greater on the right stable.  No segmental consolidation or pulmonary edema.   Original Report Authenticated By: Fuller Canada, M.D.    EKG: Normal sinus rhythm Left axis deviation Septal infarct , age undetermined  Full Code   Hospital Course: He H&P for complete admission  details. The patient is a 56 year old  white male who was recently discharged from [facility] on Cipro. He has taken 2 doses and immediately after taking the medication, began having substernal chest pain. He was on Cipro for Escherichia coli and Klebsiella urinary tract infection with bacteremia. VQ scan showed no PE. He was observed overnight. Records were obtained from [facility]. He ruled out for MI. I suspect he may have had a GI, adverse from the Cipro. I have changed him to Ceftin. His E. coli and Klebsiella are sensitive. He is tolerated to Ceftin while here. He has never had a cardiac workup. His father died of a heart attack at age 58 and it would be reasonable to consider outpatient stress testing. He may followup with [facility] cardiology.  Discharge Exam:  Blood pressure 142/83, pulse 64, temperature 98.2 F (36.8 C), temperature source Oral, resp. rate 20, height 6\' 1"  (1.854 m), weight 94.1 kg (207 lb 7.3 oz), SpO2 97.00%.  All: Comfortable. Lungs clear to auscultation bilaterally without wheeze rhonchi or rales Cardiovascular regular rate rhythm without murmurs gallops rubs Abdomen soft nontender nondistended No clubbing cyanosis or edema.  SignedChristiane Ha 01/13/2012, 11:55 AM

## 2012-01-13 NOTE — Care Management Note (Unsigned)
    Page 1 of 1   01/13/2012     10:59:30 AM   CARE MANAGEMENT NOTE 01/13/2012  Patient:  [name], [name]   Account Number:  [id number]  Date Initiated:  01/13/2012  Documentation initiated by:  Rosemary Holms  Subjective/Objective Assessment:   Pt admitted from home. Recently dc'd from [place]. DC summary and labs to be faxed to [facility]. Currently has [facility].     Action/Plan:   DC home with HH resumed   Anticipated DC Date:  01/14/2012   Anticipated DC Plan:  HOME W HOME HEALTH SERVICES      DC Planning Services  CM consult      Choice offered to / List presented to:             [facility] agency  [facility]   Status of service:  In process, will continue to follow Medicare Important Message given?   (If response is "NO", the following Medicare IM given date fields will be blank) Date Medicare IM given:   Date Additional Medicare IM given:    Discharge Disposition:    Per UR Regulation:    If discussed at Long Length of Stay Meetings, dates discussed:    Comments:  10/311/13 Rosemary Holms RN [facility]

## 2012-01-13 NOTE — Progress Notes (Signed)
Pt ambulated in hallway with no assist. Pt tolerated well. No SOB noted. No complaints at this time. Will continue to monitor.

## 2012-01-13 NOTE — Progress Notes (Signed)
UR Chart Review Completed  

## 2012-01-13 NOTE — Progress Notes (Signed)
Pt discharged home today per Dr. Lendell Caprice. Pt's IV site D/C'd and WNL. Pt's VS stable at this time. Pt provided with home medication list, discharge instructions, prescriptions and medical records from [facility]. Pt verbalized understanding. Pt left floor via WC in stable condition accompanied by NT.

## 2012-01-24 ENCOUNTER — Encounter: Payer: Medicaid Other | Admitting: Adult Health

## 2012-03-10 ENCOUNTER — Emergency Department (HOSPITAL_COMMUNITY)
Admission: EM | Admit: 2012-03-10 | Discharge: 2012-03-10 | Disposition: A | Discharge status: Home / Self Care | Payer: Medicaid Other | Attending: Emergency Medicine | Admitting: Emergency Medicine

## 2012-03-10 ENCOUNTER — Encounter (HOSPITAL_COMMUNITY): Payer: Self-pay | Admitting: *Deleted

## 2012-03-10 DIAGNOSIS — Z862 Personal history of diseases of the blood and blood-forming organs and certain disorders involving the immune mechanism: Secondary | ICD-10-CM | POA: Insufficient documentation

## 2012-03-10 DIAGNOSIS — Y929 Unspecified place or not applicable: Secondary | ICD-10-CM | POA: Insufficient documentation

## 2012-03-10 DIAGNOSIS — Z8551 Personal history of malignant neoplasm of bladder: Secondary | ICD-10-CM | POA: Insufficient documentation

## 2012-03-10 DIAGNOSIS — Z8719 Personal history of other diseases of the digestive system: Secondary | ICD-10-CM | POA: Insufficient documentation

## 2012-03-10 DIAGNOSIS — X58XXXA Exposure to other specified factors, initial encounter: Secondary | ICD-10-CM | POA: Insufficient documentation

## 2012-03-10 DIAGNOSIS — S336XXA Sprain of sacroiliac joint, initial encounter: Secondary | ICD-10-CM

## 2012-03-10 DIAGNOSIS — N390 Urinary tract infection, site not specified: Secondary | ICD-10-CM

## 2012-03-10 DIAGNOSIS — Z87891 Personal history of nicotine dependence: Secondary | ICD-10-CM | POA: Insufficient documentation

## 2012-03-10 DIAGNOSIS — Z8639 Personal history of other endocrine, nutritional and metabolic disease: Secondary | ICD-10-CM | POA: Insufficient documentation

## 2012-03-10 DIAGNOSIS — Y939 Activity, unspecified: Secondary | ICD-10-CM | POA: Insufficient documentation

## 2012-03-10 DIAGNOSIS — Z87448 Personal history of other diseases of urinary system: Secondary | ICD-10-CM | POA: Insufficient documentation

## 2012-03-10 DIAGNOSIS — K7460 Unspecified cirrhosis of liver: Secondary | ICD-10-CM | POA: Insufficient documentation

## 2012-03-10 LAB — URINALYSIS, ROUTINE W REFLEX MICROSCOPIC
Glucose, UA: NEGATIVE mg/dL
Leukocytes, UA: NEGATIVE
Specific Gravity, Urine: 1.01 (ref 1.005–1.030)
Urobilinogen, UA: 0.2 mg/dL (ref 0.0–1.0)

## 2012-03-10 LAB — URINE MICROSCOPIC-ADD ON

## 2012-03-10 MED ORDER — CYCLOBENZAPRINE HCL 10 MG PO TABS
10.0000 mg | ORAL_TABLET | Freq: Once | ORAL | Status: AC
  Administered 2012-03-10: 10 mg via ORAL
  Filled 2012-03-10: qty 1

## 2012-03-10 MED ORDER — CYCLOBENZAPRINE HCL 10 MG PO TABS: ORAL_TABLET | ORAL | Status: DC

## 2012-03-10 MED ORDER — CIPROFLOXACIN HCL 500 MG PO TABS: 500.0000 mg | ORAL_TABLET | Freq: Two times a day (BID) | ORAL | Status: DC

## 2012-03-10 MED ORDER — HYDROCODONE-ACETAMINOPHEN 5-325 MG PO TABS: 1.0000 | ORAL_TABLET | Freq: Four times a day (QID) | ORAL | Status: AC | PRN

## 2012-03-10 NOTE — ED Notes (Addendum)
Lower back pain x 3 days. Denies hx or injury. Recently treated for UTI.

## 2012-03-10 NOTE — ED Provider Notes (Signed)
History     CSN: [id number]  Arrival date & time 03/10/12  1624   First MD Initiated Contact with Patient 03/10/12 1847      Chief Complaint  Patient presents with  . Back Pain    (Consider location/radiation/quality/duration/timing/severity/associated sxs/prior treatment) HPI Comments: Denies fever or chills.  No UTI sxs.  Pt has a urinary ostomy bag.  Bladder removed secondary to CA.  Pt has urologist in [place].  PCP is [facility].  Patient is a 56 y.o. male presenting with back pain. The history is provided by the patient. No language interpreter was used.  Back Pain  This is a new problem. Episode onset: several days ago. The problem occurs constantly. The problem has not changed since onset.The pain is associated with no known injury. The pain is present in the sacro-iliac joint. The quality of the pain is described as stabbing. The pain does not radiate. The symptoms are aggravated by bending. The pain is the same all the time. Pertinent negatives include no fever and no dysuria. Treatments tried: dose of tylenol yest. The treatment provided no relief.    Past Medical History  Diagnosis Date  . Gross hematuria   . C. difficile enteritis 07/04/2011  . Diverticulitis   . Subcutaneous emphysema, postoperative 01/07/2012  . Acute pyelonephritis 01/07/2012  . Cirrhosis 01/07/2012    Radiographically  . Splenomegaly   . 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 Elbow surgery   . Transurethral resection of bladder tumor 10/26/2011    Procedure: TRANSURETHRAL RESECTION OF BLADDER TUMOR (TURBT);  Surgeon: Ky Barban, MD;  Location: [facility] ORS;  Service: Urology;  Laterality: N/A;  . Transurethral resection of prostate 10/26/2011    Procedure: TRANSURETHRAL RESECTION OF THE PROSTATE (TURP);  Surgeon: Ky Barban,  MD;  Location: [facility]S;  Service: Urology;  Laterality: N/A;    Family History  Problem Relation Age of Onset  . Heart failure Father   . Hypertension Mother     History  Substance Use Topics  . Smoking status: Former Smoker    Quit date: 12/03/2007  . Smokeless tobacco: Not on file  . Alcohol Use: No      Review of Systems  Constitutional: Negative for fever and chills.  Genitourinary: Negative for dysuria, frequency, hematuria and penile swelling.  Musculoskeletal: Positive for back pain.  All other systems reviewed and are negative.    Allergies  Review of patient's allergies indicates no known allergies.  Home Medications   Current Outpatient Rx  Name  Route  Sig  Dispense  Refill  . ALPRAZOLAM 1 MG PO TABS   Oral   Take 0.5 mg by mouth daily as needed. For anxiety         . TRAZODONE HCL 50 MG PO TABS   Oral   Take 50 mg by mouth at bedtime. Sleep         . CIPROFLOXACIN HCL 500 MG PO TABS   Oral   Take 1 tablet (500 mg total) by mouth 2 (two) times daily.   14 tablet   0   . CYCLOBENZAPRINE HCL 10 MG PO TABS      1/2 to one tab po TID   20 tablet   0   . HYDROCODONE-ACETAMINOPHEN 5-325 MG PO TABS   Oral  Take 1 tablet by mouth every 6 (six) hours as needed for pain.   20 tablet   0     BP 141/84  Pulse 82  Temp 97.9 F (36.6 C) (Oral)  Resp 20  Ht 6\' 1"  (1.854 m)  Wt 220 lb (99.791 kg)  BMI 29.03 kg/m2  SpO2 97%  Physical Exam  Nursing note and vitals reviewed. Constitutional: He is oriented to person, place, and time. He appears well-developed and well-nourished.  HENT:  Head: Normocephalic and atraumatic.  Eyes: EOM are normal.  Neck: Normal range of motion.  Cardiovascular: Normal rate, regular rhythm, normal heart sounds and intact distal pulses.   Pulmonary/Chest: Effort normal and breath sounds normal. No respiratory distress.  Abdominal: Soft. He exhibits no distension. There is no tenderness.  Musculoskeletal:        Back:  Neurological: He is alert and oriented to person, place, and time.  Skin: Skin is warm and dry.  Psychiatric: He has a normal mood and affect. Judgment normal.    ED Course  Procedures (including critical care time)  Labs Reviewed  URINALYSIS, ROUTINE W REFLEX MICROSCOPIC - Abnormal; Notable for the following:    Hgb urine dipstick TRACE (*)     Nitrite POSITIVE (*)     All other components within normal limits  URINE MICROSCOPIC-ADD ON - Abnormal; Notable for the following:    Bacteria, UA MANY (*)     All other components within normal limits  URINE CULTURE   No results found.   1. Sacroiliac (ligament) sprain   2. UTI (lower urinary tract infection)       MDM  Flexeril, 20 rx-cipro, 14 Urine cx pending.        Evalina Field, [place] 03/12/12 1715

## 2012-03-10 NOTE — ED Notes (Signed)
Pain lt side of back for 3 days, no known injury.  Recent UTI

## 2012-03-12 LAB — URINE CULTURE

## 2012-03-13 NOTE — ED Notes (Signed)
+   Urine Chart sent to EDP office for review. 

## 2012-03-13 NOTE — ED Provider Notes (Signed)
Medical screening examination/treatment/procedure(s) were performed by non-physician practitioner and as supervising physician I was immediately available for consultation/collaboration.  Irie Fiorello, MD 03/13/12 2047 

## 2012-03-18 ENCOUNTER — Telehealth (HOSPITAL_COMMUNITY): Payer: Self-pay | Admitting: Emergency Medicine

## 2012-03-18 NOTE — ED Notes (Signed)
Rx called in to [facility] in [place] by Steward Ros PFM.

## 2012-03-18 NOTE — ED Notes (Signed)
Chart returned from EDP office. Prescribed Bactrim DS 1 tablet q 12 h x 7 days. #14. No refills. Prescribed by Arthor Captain PA-C.

## 2012-03-29 ENCOUNTER — Inpatient Hospital Stay (HOSPITAL_COMMUNITY)
Admission: EM | Admit: 2012-03-29 | Discharge: 2012-03-31 | DRG: 395 | Disposition: A | Discharge status: Home / Self Care | Payer: Medicaid Other | Attending: General Surgery | Admitting: General Surgery

## 2012-03-29 ENCOUNTER — Encounter (HOSPITAL_COMMUNITY): Payer: Self-pay | Admitting: *Deleted

## 2012-03-29 ENCOUNTER — Emergency Department (HOSPITAL_COMMUNITY): Payer: Medicaid Other

## 2012-03-29 DIAGNOSIS — K5900 Constipation, unspecified: Secondary | ICD-10-CM | POA: Diagnosis present

## 2012-03-29 DIAGNOSIS — K420 Umbilical hernia with obstruction, without gangrene: Principal | ICD-10-CM | POA: Diagnosis present

## 2012-03-29 DIAGNOSIS — Z906 Acquired absence of other parts of urinary tract: Secondary | ICD-10-CM

## 2012-03-29 DIAGNOSIS — C679 Malignant neoplasm of bladder, unspecified: Secondary | ICD-10-CM | POA: Diagnosis present

## 2012-03-29 DIAGNOSIS — K429 Umbilical hernia without obstruction or gangrene: Secondary | ICD-10-CM

## 2012-03-29 DIAGNOSIS — Z87891 Personal history of nicotine dependence: Secondary | ICD-10-CM

## 2012-03-29 DIAGNOSIS — K56609 Unspecified intestinal obstruction, unspecified as to partial versus complete obstruction: Secondary | ICD-10-CM

## 2012-03-29 LAB — COMPREHENSIVE METABOLIC PANEL
ALT: 44 U/L (ref 0–53)
AST: 57 U/L — ABNORMAL HIGH (ref 0–37)
Albumin: 4.1 g/dL (ref 3.5–5.2)
CO2: 27 mEq/L (ref 19–32)
Chloride: 103 mEq/L (ref 96–112)
GFR calc non Af Amer: 69 mL/min — ABNORMAL LOW (ref >90)
Sodium: 139 mEq/L (ref 135–145)
Total Bilirubin: 0.4 mg/dL (ref 0.3–1.2)

## 2012-03-29 LAB — URINE MICROSCOPIC-ADD ON

## 2012-03-29 LAB — URINALYSIS, ROUTINE W REFLEX MICROSCOPIC
Leukocytes, UA: NEGATIVE
Nitrite: NEGATIVE
Protein, ur: NEGATIVE mg/dL
Specific Gravity, Urine: 1.01 (ref 1.005–1.030)
Urobilinogen, UA: 0.2 mg/dL (ref 0.0–1.0)

## 2012-03-29 LAB — CBC WITH DIFFERENTIAL/PLATELET
Basophils Absolute: 0 10*3/uL (ref 0.0–0.1)
Basophils Relative: 0 % (ref 0–1)
Lymphocytes Relative: 22 % (ref 12–46)
MCHC: 34.1 g/dL (ref 30.0–36.0)
Neutro Abs: 6 10*3/uL (ref 1.7–7.7)
Neutrophils Relative %: 69 % (ref 43–77)
Platelets: 242 10*3/uL (ref 150–400)
RDW: 15.3 % (ref 11.5–15.5)
WBC: 8.7 10*3/uL (ref 4.0–10.5)

## 2012-03-29 MED ORDER — ONDANSETRON HCL 4 MG/2ML IJ SOLN
4.0000 mg | Freq: Once | INTRAMUSCULAR | Status: AC
  Administered 2012-03-29: 4 mg via INTRAVENOUS
  Filled 2012-03-29: qty 2

## 2012-03-29 MED ORDER — MIRTAZAPINE 30 MG PO TABS
15.0000 mg | ORAL_TABLET | Freq: Every day | ORAL | Status: DC
  Administered 2012-03-29 – 2012-03-30 (×2): 15 mg via ORAL
  Filled 2012-03-29 (×2): qty 1

## 2012-03-29 MED ORDER — ENOXAPARIN SODIUM 40 MG/0.4ML ~~LOC~~ SOLN
40.0000 mg | SUBCUTANEOUS | Status: DC
  Administered 2012-03-29 – 2012-03-30 (×2): 40 mg via SUBCUTANEOUS
  Filled 2012-03-29 (×2): qty 0.4

## 2012-03-29 MED ORDER — PANTOPRAZOLE SODIUM 40 MG IV SOLR
40.0000 mg | Freq: Every day | INTRAVENOUS | Status: DC
  Administered 2012-03-29 – 2012-03-30 (×2): 40 mg via INTRAVENOUS
  Filled 2012-03-29 (×2): qty 40

## 2012-03-29 MED ORDER — HYDROMORPHONE HCL PF 1 MG/ML IJ SOLN
1.0000 mg | Freq: Once | INTRAMUSCULAR | Status: AC
  Administered 2012-03-29: 1 mg via INTRAVENOUS
  Filled 2012-03-29: qty 1

## 2012-03-29 MED ORDER — IOHEXOL 300 MG/ML  SOLN
50.0000 mL | Freq: Once | INTRAMUSCULAR | Status: AC | PRN
  Administered 2012-03-29: 50 mL via ORAL

## 2012-03-29 MED ORDER — IOHEXOL 300 MG/ML  SOLN
100.0000 mL | Freq: Once | INTRAMUSCULAR | Status: AC | PRN
  Administered 2012-03-29: 100 mL via INTRAVENOUS

## 2012-03-29 MED ORDER — HYDROMORPHONE HCL PF 1 MG/ML IJ SOLN
1.0000 mg | INTRAMUSCULAR | Status: DC | PRN
  Administered 2012-03-29: 2 mg via INTRAVENOUS
  Administered 2012-03-30 (×2): 1 mg via INTRAVENOUS
  Filled 2012-03-29 (×2): qty 1
  Filled 2012-03-29: qty 2

## 2012-03-29 MED ORDER — SODIUM CHLORIDE 0.9 % IV SOLN: INTRAVENOUS | Status: DC

## 2012-03-29 MED ORDER — SODIUM CHLORIDE 0.9 % IV SOLN: INTRAVENOUS | Status: AC

## 2012-03-29 MED ORDER — SODIUM CHLORIDE 0.9 % IV BOLUS (SEPSIS)
250.0000 mL | Freq: Once | INTRAVENOUS | Status: AC
  Administered 2012-03-29: 250 mL via INTRAVENOUS

## 2012-03-29 MED ORDER — ONDANSETRON HCL 4 MG/2ML IJ SOLN: 4.0000 mg | Freq: Three times a day (TID) | INTRAMUSCULAR | Status: DC | PRN

## 2012-03-29 MED ORDER — LACTATED RINGERS IV SOLN
INTRAVENOUS | Status: DC
  Administered 2012-03-29: 115 mL/h via INTRAVENOUS
  Administered 2012-03-30: 10:00:00 via INTRAVENOUS

## 2012-03-29 MED ORDER — HYDROMORPHONE HCL PF 1 MG/ML IJ SOLN: 1.0000 mg | INTRAMUSCULAR | Status: DC | PRN

## 2012-03-29 MED ORDER — ONDANSETRON HCL 4 MG/2ML IJ SOLN
4.0000 mg | Freq: Four times a day (QID) | INTRAMUSCULAR | Status: DC | PRN
  Administered 2012-03-30 (×2): 4 mg via INTRAVENOUS
  Filled 2012-03-29 (×2): qty 2

## 2012-03-29 NOTE — ED Provider Notes (Signed)
History  This chart was scribed for Edwin Jakes, MD by Erskine Emery, ED Scribe. This patient was seen in room APA04/APA04 and the patient's care was started at 13:55.   CSN: [id number]  Arrival date & time 03/29/12  1228   First MD Initiated Contact with Patient 03/29/12 1355      Chief Complaint  Patient presents with  . Abdominal Pain    (Consider location/radiation/quality/duration/timing/severity/associated sxs/prior treatment) HPI [name] is a 57 y.o. male who presents to the Emergency Department complaining of 10/10 severity abdominal pain that radiates from the right abdomen, across the abdomen, into the left flank where the pain is worst since just PTA, around 11:30am. Pt reports some associated back pain, and 2 hernias (abdominal and naval) but denies any nausea, emesis, diarrhea, fever, chills, change in urine, cold symptoms, cough, sore throat, body aches, headache, rash, visual changes, or h/o bleeding easily. Pt reports he has never had pain like this before. He has no h/o kidney stones, but has a h/o hematuria and bladder cancer. Pt had his bladder, prostate, and lymph nodes removed in October. He did not have any radiation treatment. He now has a urostomy bag. He has been followed by a urologist at [facility] in [place] who he saw in December and is supposed to see again for a CT in May.  Pt was here December 27th for a UTI. He was prescribed cipro, which he finished, then was prescribed another antibiotic which he finished 5 days ago. Pt has NKDA.  [facility] is the pt's PCP.  Past Medical History  Diagnosis Date  . Gross hematuria   . C. difficile enteritis 07/04/2011  . Diverticulitis   . Subcutaneous emphysema, postoperative 01/07/2012  . Cirrhosis 01/07/2012    Radiographically  . Splenomegaly   . 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 Acute pyelonephritis 01/07/2012    Past Surgical History  Procedure Date  . Elbow surgery   . Transurethral resection of bladder tumor 10/26/2011    Procedure: TRANSURETHRAL RESECTION OF BLADDER TUMOR (TURBT);  Surgeon: Ky Barban, MD;  Location: [facility];  Service: Urology;  Laterality: N/A;  . Transurethral resection of prostate 10/26/2011    Procedure: TRANSURETHRAL RESECTION OF THE PROSTATE (TURP);  Surgeon: Ky Barban, MD;  Location: [facility];  Service: Urology;  Laterality: N/A;    Family History  Problem Relation Age of Onset  . Heart failure Father   . Hypertension Mother     History  Substance Use Topics  . Smoking status: Former Smoker    Quit date: 12/03/2007  . Smokeless tobacco: Not on file  . Alcohol Use: No      Review of Systems  Constitutional: Negative for fever and chills.  HENT: Negative for congestion, sore throat and neck pain.   Eyes: Negative for visual disturbance.  Respiratory: Negative for cough.   Gastrointestinal: Positive for abdominal pain.  Genitourinary: Negative for dysuria and hematuria.  Musculoskeletal: Positive for back pain.       Left flank pain  Skin: Negative for rash.  Neurological: Negative for headaches.  All other systems reviewed and are negative.    Allergies  Review of patient's allergies indicates no known allergies.  Home Medications   Current Outpatient Rx  Name  Route  Sig  Dispense  Refill  . ALPRAZOLAM  1 MG PO TABS   Oral   Take 0.5 mg by mouth daily as needed. For anxiety         . TRAZODONE HCL 50 MG PO TABS   Oral   Take 50 mg by mouth at bedtime. Sleep           Triage Vitals: BP 185/97  Pulse 88  Temp 97.4 F (36.3 C) (Oral)  Resp 20  Ht 6\' 1"  (1.854 m)  Wt 220 lb (99.791 kg)  BMI 29.03 kg/m2  SpO2 96%  Physical Exam  Nursing note and vitals reviewed. Constitutional: He is oriented to person, place, and time. He appears well-developed  and well-nourished. No distress.  HENT:  Head: Normocephalic and atraumatic.  Mouth/Throat: Oropharynx is clear and moist.  Eyes: EOM are normal. Pupils are equal, round, and reactive to light.       Sclera are clear  Neck: Neck supple. No tracheal deviation present.  Cardiovascular: Normal rate, regular rhythm and normal heart sounds.   No murmur heard. Pulmonary/Chest: Effort normal and breath sounds normal. No respiratory distress. He has no wheezes.  Abdominal: Soft. Bowel sounds are normal. He exhibits no distension. There is no tenderness.  Genitourinary:       Urostomy bag.  Musculoskeletal: Normal range of motion. He exhibits no edema.       No swelling in the leg.  Neurological: He is alert and oriented to person, place, and time.  Skin: Skin is warm and dry.  Psychiatric: He has a normal mood and affect.    ED Course  Procedures (including critical care time) DIAGNOSTIC STUDIES: Oxygen Saturation is 96% on room air, adequate by my interpretation.    COORDINATION OF CARE: 14:05--I evaluated the patient and we discussed a treatment plan including abdominal CT to which the pt agreed.    Labs Reviewed  URINALYSIS, ROUTINE W REFLEX MICROSCOPIC - Abnormal; Notable for the following:    Hgb urine dipstick TRACE (*)     All other components within normal limits  COMPREHENSIVE METABOLIC PANEL - Abnormal; Notable for the following:    Glucose, Bld 105 (*)     AST 57 (*)     GFR calc non Af Amer 69 (*)     GFR calc Af Amer 80 (*)     All other components within normal limits  URINE MICROSCOPIC-ADD ON  CBC WITH DIFFERENTIAL  LIPASE, BLOOD  URINE CULTURE   Ct Abdomen Pelvis W Contrast  03/29/2012  *RADIOLOGY REPORT*  Clinical Data: Abdominal pain, nausea, history of bladder carcinoma post resection.  CT ABDOMEN AND PELVIS WITH CONTRAST  Technique:  Multidetector CT imaging of the abdomen and pelvis was performed following the standard protocol during bolus administration of  intravenous contrast.  Contrast: 50mL OMNIPAQUE IOHEXOL 300 MG/ML  SOLN, OMNIPAQUE IOHEXOL 300 MG/ML  SOLN  Comparison: 01/07/2012  Findings: Minimal dependent atelectasis in the visualized lung bases.  Improvement in the linear scarring or atelectasis in the inferior lingula.  Liver has a mildly nodular contour without focal lesion or intrahepatic biliary ductal dilatation.  Unremarkable nondistended gallbladder, spleen, adrenal glands, kidneys, pancreas, abdominal aorta, portal vein.  Stomach is physiologically distended. Mild distention of proximal small bowel with incomplete passage of oral contrast material.  Small umbilical hernia involving a knuckle of small bowel with some adjacent fluid. There is  no oral contrast progression beyond this level, distal small bowel decompressed. Ileal conduit in the right lower quadrant with a peristomal hernia of  mesenteric fat.   Normal appendix.     Colon   nondilated.   Scattered distal descending and sigmoid diverticula without adjacent inflammatory/edematous change. Previous cystectomy.  Interval enlargement of   left iliac lymph nodes. Index proximal left external iliac node image 74/2 15 mm short axis diameter, previously 5 mm.  Distal left common iliac chain node image 73/2 1 cm diameter, previously 5 mm.  Preaortic poorly marginated node image 56   11 mm short axis diameter, previously 4 mm.  There are regional inflammatory/edematous changes extending along the posterior left pelvic sidewall and the presacral region along the common iliac vessels. No ascites. No free air.  Advanced facet degenerative changes in the lower lumbar spine.  Regional bones unremarkable.  IMPRESSION:  1.  Umbilical hernia involving a knuckle of distal small bowel with transition from   distended to decompressed bowel suggesting a degree of obstruction. 2.  New para-aortic and left iliac adenopathy with adjacent inflammatory/edematous changes, suggesting progressive metastatic disease  or lymphoma. 3.  Worsening parastomal hernia around the ileostomy without obstruction.   Original Report Authenticated By: D. Andria Rhein, MD    Results for orders placed during the hospital encounter of 03/29/12  URINALYSIS, ROUTINE W REFLEX MICROSCOPIC      Component Value Range   Color, Urine YELLOW  YELLOW   APPearance CLEAR  CLEAR   Specific Gravity, Urine 1.010  1.005 - 1.030   pH 6.5  5.0 - 8.0   Glucose, UA NEGATIVE  NEGATIVE mg/dL   Hgb urine dipstick TRACE (*) NEGATIVE   Bilirubin Urine NEGATIVE  NEGATIVE   Ketones, ur NEGATIVE  NEGATIVE mg/dL   Protein, ur NEGATIVE  NEGATIVE mg/dL   Urobilinogen, UA 0.2  0.0 - 1.0 mg/dL   Nitrite NEGATIVE  NEGATIVE   Leukocytes, UA NEGATIVE  NEGATIVE  URINE MICROSCOPIC-ADD ON      Component Value Range   WBC, UA 3-6  <3 WBC/hpf   RBC / HPF 0-2  <3 RBC/hpf   Bacteria, UA RARE  RARE  CBC WITH DIFFERENTIAL      Component Value Range   WBC 8.7  4.0 - 10.5 K/uL   RBC 5.75  4.22 - 5.81 MIL/uL   Hemoglobin 15.7  13.0 - 17.0 g/dL   HCT 95.6  21.3 - 08.6 %   MCV 80.0  78.0 - 100.0 fL   MCH 27.3  26.0 - 34.0 pg   MCHC 34.1  30.0 - 36.0 g/dL   RDW 57.8  46.9 - 62.9 %   Platelets 242  150 - 400 K/uL   Neutrophils Relative 69  43 - 77 %   Neutro Abs 6.0  1.7 - 7.7 K/uL   Lymphocytes Relative 22  12 - 46 %   Lymphs Abs 2.0  0.7 - 4.0 K/uL   Monocytes Relative 5  3 - 12 %   Monocytes Absolute 0.5  0.1 - 1.0 K/uL   Eosinophils Relative 3  0 - 5 %   Eosinophils Absolute 0.3  0.0 - 0.7 K/uL   Basophils Relative 0  0 - 1 %   Basophils Absolute 0.0  0.0 - 0.1 K/uL  COMPREHENSIVE METABOLIC PANEL      Component Value Range   Sodium 139  135 - 145 mEq/L   Potassium 4.4  3.5 - 5.1 mEq/L   Chloride 103  96 - 112 mEq/L   CO2 27  19 - 32 mEq/L   Glucose, Bld 105 (*) 70 - 99  mg/dL   BUN 15  6 - 23 mg/dL   Creatinine, Ser 9.60  0.50 - 1.35 mg/dL   Calcium 9.6  8.4 - 45.4 mg/dL   Total Protein 8.0  6.0 - 8.3 g/dL   Albumin 4.1  3.5 - 5.2 g/dL    AST 57 (*) 0 - 37 U/L   ALT 44  0 - 53 U/L   Alkaline Phosphatase 91  39 - 117 U/L   Total Bilirubin 0.4  0.3 - 1.2 mg/dL   GFR calc non Af Amer 69 (*) >90 mL/min   GFR calc Af Amer 80 (*) >90 mL/min  LIPASE, BLOOD      Component Value Range   Lipase 29  11 - 59 U/L     1. Small bowel obstruction   2. Hernia, umbilical   3. Bladder cancer       MDM   CT shows a bowel obstruction due to umbilical hernia. Also has incidental finding of some periaortic and iliac lymph nodes which may be related to his recent bladder cancer clinical prostate cancer. This will require further evaluation. However the bowel obstruction requires admission n.p.o. Status. Discussed with on-call surgery Dr. Leticia Penna he will admit to MedSurg floor. Debridement orders completed. Patient with no vomiting has had no bowel movements or passage of gas. He is nontoxic no acute distress not able to reduce the umbilical hernia after several attempts.  Patient's urinalysis looks very normal despite it being a urostomy. Patient's urologic surgeons were at [facility]. Followup for the findings of the adenopathy may require consultation with them.     I personally performed the services described in this documentation, which was scribed in my presence. The recorded information has been reviewed and is accurate.     Edwin Jakes, MD 03/29/12 [phone]

## 2012-03-29 NOTE — ED Notes (Signed)
Family at bedside. Patient is comfortable at this time. 

## 2012-03-29 NOTE — ED Notes (Signed)
Pt to CT

## 2012-03-29 NOTE — ED Notes (Addendum)
abd pain around to back x 1 hour.  NO NVD.  No fever.  Pain mostly lt flank

## 2012-03-30 LAB — URINE CULTURE: Colony Count: 25000

## 2012-03-30 LAB — BASIC METABOLIC PANEL
Calcium: 8.9 mg/dL (ref 8.4–10.5)
GFR calc Af Amer: >90 mL/min (ref >90)
GFR calc non Af Amer: 81 mL/min — ABNORMAL LOW (ref >90)
Potassium: 3.9 mEq/L (ref 3.5–5.1)
Sodium: 139 mEq/L (ref 135–145)

## 2012-03-30 LAB — CBC
Hemoglobin: 15.2 g/dL (ref 13.0–17.0)
Platelets: 234 10*3/uL (ref 150–400)
RBC: 5.54 MIL/uL (ref 4.22–5.81)
WBC: 7.2 10*3/uL (ref 4.0–10.5)

## 2012-03-30 MED ORDER — ALPRAZOLAM 1 MG PO TABS
1.0000 mg | ORAL_TABLET | Freq: Two times a day (BID) | ORAL | Status: DC | PRN
  Administered 2012-03-30 – 2012-03-31 (×2): 1 mg via ORAL
  Filled 2012-03-30 (×2): qty 1

## 2012-03-30 NOTE — H&P (Signed)
Edwin  King is an 57 y.o. male.   Chief Complaint: Nausea vomiting abdominal pain. HPI: Patient presented to [facility] with worsening abdominal pain and nausea. He had several episodes of nonbloody emesis. Patient has had previous bladder and ureteral surgery due to bladder cancer. He does have an ileal conduit. This is been functioning well. He is recently been treated for a severe urinary tract infection and just finished a course of antibiotics. He has had some low-grade subjective fevers and chills. He is also had a known parastomal and umbilical hernia. With his increasing abdominal discomfort and pain he noted the umbilical hernia to be more prominent. Due to his symptomatology he presented to [facility] emergency department. Since this initial admission patient's symptomatology has improved with bowel rest. His nausea has improved. He has passed some flatus. No bowel movement. He does have some appetite at this time. Abdominal pain has resolved.  Past Medical History  Diagnosis Date  . Gross hematuria   . C. difficile enteritis 07/04/2011  . Diverticulitis   . Subcutaneous emphysema, postoperative 01/07/2012  . Cirrhosis 01/07/2012    Radiographically  . Splenomegaly   . 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 Acute pyelonephritis 01/07/2012    Past Surgical History  Procedure Date  . Elbow surgery   . Transurethral resection of bladder tumor 10/26/2011    Procedure: TRANSURETHRAL RESECTION OF BLADDER TUMOR (TURBT);  Surgeon: Ky Barban, MD;  Location: [facility];  Service: Urology;  Laterality: N/A;  . Transurethral resection of prostate 10/26/2011    Procedure: TRANSURETHRAL RESECTION OF THE PROSTATE (TURP);  Surgeon: Ky Barban, MD;  Location: [facility];  Service: Urology;  Laterality: N/A;    Family History  Problem Relation Age of  Onset  . Heart failure Father   . Hypertension Mother    Social History:  reports that he quit smoking about 4 years ago. He does not have any smokeless tobacco history on file. He reports that he does not drink alcohol or use illicit drugs.  Allergies: No Known Allergies  Medications Prior to Admission  Medication Sig Dispense Refill  . ALPRAZolam (XANAX) 1 MG tablet Take 0.5 mg by mouth daily as needed. For anxiety      . traZODone (DESYREL) 50 MG tablet Take 50 mg by mouth at bedtime. Sleep        Results for orders placed during the hospital encounter of 03/29/12 (from the past 48 hour(s))  CBC WITH DIFFERENTIAL     Status: Normal   Collection Time   03/29/12  1:29 PM      Component Value Range Comment   WBC 8.7  4.0 - 10.5 K/uL    RBC 5.75  4.22 - 5.81 MIL/uL    Hemoglobin 15.7  13.0 - 17.0 g/dL    HCT 40.9  81.1 - 91.4 %    MCV 80.0  78.0 - 100.0 fL    MCH 27.3  26.0 - 34.0 pg    MCHC 34.1  30.0 - 36.0 g/dL    RDW 78.2  95.6 - 21.3 %    Platelets 242  150 - 400 K/uL    Neutrophils Relative 69  43 - 77 %    Neutro Abs 6.0  1.7 - 7.7 K/uL    Lymphocytes Relative 22  12 - 46 %    Lymphs  Abs 2.0  0.7 - 4.0 K/uL    Monocytes Relative 5  3 - 12 %    Monocytes Absolute 0.5  0.1 - 1.0 K/uL    Eosinophils Relative 3  0 - 5 %    Eosinophils Absolute 0.3  0.0 - 0.7 K/uL    Basophils Relative 0  0 - 1 %    Basophils Absolute 0.0  0.0 - 0.1 K/uL   COMPREHENSIVE METABOLIC PANEL     Status: Abnormal   Collection Time   03/29/12  1:29 PM      Component Value Range Comment   Sodium 139  135 - 145 mEq/L    Potassium 4.4  3.5 - 5.1 mEq/L    Chloride 103  96 - 112 mEq/L    CO2 27  19 - 32 mEq/L    Glucose, Bld 105 (*) 70 - 99 mg/dL    BUN 15  6 - 23 mg/dL    Creatinine, Ser 4.78  0.50 - 1.35 mg/dL    Calcium 9.6  8.4 - 29.5 mg/dL    Total Protein 8.0  6.0 - 8.3 g/dL    Albumin 4.1  3.5 - 5.2 g/dL    AST 57 (*) 0 - 37 U/L    ALT 44  0 - 53 U/L    Alkaline Phosphatase 91  39 - 117  U/L    Total Bilirubin 0.4  0.3 - 1.2 mg/dL    GFR calc non Af Amer 69 (*) >90 mL/min    GFR calc Af Amer 80 (*) >90 mL/min   LIPASE, BLOOD     Status: Normal   Collection Time   03/29/12  1:29 PM      Component Value Range Comment   Lipase 29  11 - 59 U/L   URINALYSIS, ROUTINE W REFLEX MICROSCOPIC     Status: Abnormal   Collection Time   03/29/12  1:35 PM      Component Value Range Comment   Color, Urine YELLOW  YELLOW    APPearance CLEAR  CLEAR    Specific Gravity, Urine 1.010  1.005 - 1.030    pH 6.5  5.0 - 8.0    Glucose, UA NEGATIVE  NEGATIVE mg/dL    Hgb urine dipstick TRACE (*) NEGATIVE    Bilirubin Urine NEGATIVE  NEGATIVE    Ketones, ur NEGATIVE  NEGATIVE mg/dL    Protein, ur NEGATIVE  NEGATIVE mg/dL    Urobilinogen, UA 0.2  0.0 - 1.0 mg/dL    Nitrite NEGATIVE  NEGATIVE    Leukocytes, UA NEGATIVE  NEGATIVE   URINE MICROSCOPIC-ADD ON     Status: Normal   Collection Time   03/29/12  1:35 PM      Component Value Range Comment   WBC, UA 3-6  <3 WBC/hpf    RBC / HPF 0-2  <3 RBC/hpf    Bacteria, UA RARE  RARE   BASIC METABOLIC PANEL     Status: Abnormal   Collection Time   03/30/12  4:47 AM      Component Value Range Comment   Sodium 139  135 - 145 mEq/L    Potassium 3.9  3.5 - 5.1 mEq/L    Chloride 105  96 - 112 mEq/L    CO2 25  19 - 32 mEq/L    Glucose, Bld 91  70 - 99 mg/dL    BUN 12  6 - 23 mg/dL    Creatinine, Ser 6.21  0.50 -  1.35 mg/dL    Calcium 8.9  8.4 - 16.1 mg/dL    GFR calc non Af Amer 81 (*) >90 mL/min    GFR calc Af Amer >90  >90 mL/min   CBC     Status: Normal   Collection Time   03/30/12  4:47 AM      Component Value Range Comment   WBC 7.2  4.0 - 10.5 K/uL    RBC 5.54  4.22 - 5.81 MIL/uL    Hemoglobin 15.2  13.0 - 17.0 g/dL    HCT 09.6  04.5 - 40.9 %    MCV 80.5  78.0 - 100.0 fL    MCH 27.4  26.0 - 34.0 pg    MCHC 34.1  30.0 - 36.0 g/dL    RDW 81.1  91.4 - 78.2 %    Platelets 234  150 - 400 K/uL    Ct Abdomen Pelvis W Contrast  03/29/2012   *RADIOLOGY REPORT*  Clinical Data: Abdominal pain, nausea, history of bladder carcinoma post resection.  CT ABDOMEN AND PELVIS WITH CONTRAST  Technique:  Multidetector CT imaging of the abdomen and pelvis was performed following the standard protocol during bolus administration of intravenous contrast.  Contrast: 50mL OMNIPAQUE IOHEXOL 300 MG/ML  SOLN, OMNIPAQUE IOHEXOL 300 MG/ML  SOLN  Comparison: 01/07/2012  Findings: Minimal dependent atelectasis in the visualized lung bases.  Improvement in the linear scarring or atelectasis in the inferior lingula.  Liver has a mildly nodular contour without focal lesion or intrahepatic biliary ductal dilatation.  Unremarkable nondistended gallbladder, spleen, adrenal glands, kidneys, pancreas, abdominal aorta, portal vein.  Stomach is physiologically distended. Mild distention of proximal small bowel with incomplete passage of oral contrast material.  Small umbilical hernia involving a knuckle of small bowel with some adjacent fluid. There is  no oral contrast progression beyond this level, distal small bowel decompressed. Ileal conduit in the right lower quadrant with a peristomal hernia of mesenteric fat.   Normal appendix.     Colon   nondilated.   Scattered distal descending and sigmoid diverticula without adjacent inflammatory/edematous change. Previous cystectomy.  Interval enlargement of   left iliac lymph nodes. Index proximal left external iliac node image 74/2 15 mm short axis diameter, previously 5 mm.  Distal left common iliac chain node image 73/2 1 cm diameter, previously 5 mm.  Preaortic poorly marginated node image 56   11 mm short axis diameter, previously 4 mm.  There are regional inflammatory/edematous changes extending along the posterior left pelvic sidewall and the presacral region along the common iliac vessels. No ascites. No free air.  Advanced facet degenerative changes in the lower lumbar spine.  Regional bones unremarkable.  IMPRESSION:  1.   Umbilical hernia involving a knuckle of distal small bowel with transition from   distended to decompressed bowel suggesting a degree of obstruction. 2.  New para-aortic and left iliac adenopathy with adjacent inflammatory/edematous changes, suggesting progressive metastatic disease or lymphoma. 3.  Worsening parastomal hernia around the ileostomy without obstruction.   Original Report Authenticated By: D. Andria Rhein, MD     Review of Systems  Constitutional: Negative for fever, chills and weight loss.  HENT: Negative.   Eyes: Negative.   Respiratory: Negative.   Cardiovascular: Negative.   Gastrointestinal: Positive for heartburn, nausea, vomiting, abdominal pain (colicky) and constipation. Negative for diarrhea, blood in stool and melena.  Genitourinary: Negative.   Musculoskeletal: Negative.   Skin: Negative.   Neurological: Negative.  Negative for weakness.  Endo/Heme/Allergies: Negative.   Psychiatric/Behavioral: Negative.     Blood pressure 131/71, pulse 78, temperature 97.2 F (36.2 C), temperature source Oral, resp. rate 18, height 6\' 1"  (1.854 m), weight 102.7 kg (226 lb 6.6 oz), SpO2 96.00%. Physical Exam  Constitutional: He is oriented to person, place, and time. He appears well-developed and well-nourished. No distress.  HENT:  Head: Normocephalic and atraumatic.  Eyes: Conjunctivae normal and EOM are normal. Pupils are equal, round, and reactive to light. No scleral icterus.  Neck: Normal range of motion. Neck supple. No tracheal deviation present. No thyromegaly present.  Cardiovascular: Normal rate, regular rhythm and normal heart sounds.   Respiratory: Effort normal and breath sounds normal. No respiratory distress.  GI: Soft. He exhibits no distension and no mass. There is tenderness (mild lower abdominal tenderness). There is no rebound and no guarding.       Ileostomy conduit and right lower quadrant. Positive parastomal hernia palpated. Hernia is palpated and soft.  Also noted umbilical hernia. This is reducible with gentle pressure. Contents of the hernia again returned to the soft tissue upon releasing pressure. No significant discomfort or pain.  Musculoskeletal: Normal range of motion.  Lymphadenopathy:    He has no cervical adenopathy.  Neurological: He is alert and oriented to person, place, and time.  Skin: Skin is warm and dry.     Assessment/Plan Small bowel obstruction. Possible etiologies were discussed the patient. Given the patient's CT findings I am suspicious that the umbilical hernia is contributing to his symptomatology. At this time however his hernia is easily reducible and I feel at this time his small bowel structure is resolving. We have discussed possible repair of the umbilical hernia however at this time we'll continue to see if patient progresses with a small bowel obstruction. Should he continue to demonstrate improvement in this may be something we will consider repairing is an outpatient. Furthermore we have discussed the peristomal hernia however this time this is asymptomatic in at this time no immediate plans would be to repair this hernia. Surgical indications have been discussed with the patient. Patient's comfortable with current treatment plan. Continue the patient on clear liquids as tolerated for now and slowly advance his patient demonstrates bowel function and tolerance to the advancement of diet. Continue ambulation. Continue DVT prophylaxis.  Theodoros Stjames C 03/30/2012, 9:02 PM

## 2012-03-30 NOTE — Progress Notes (Signed)
UR Chart Review Completed  

## 2012-03-31 LAB — CBC
HCT: 43.9 % (ref 39.0–52.0)
Hemoglobin: 15.1 g/dL (ref 13.0–17.0)
RBC: 5.49 MIL/uL (ref 4.22–5.81)
RDW: 15 % (ref 11.5–15.5)
WBC: 6.8 10*3/uL (ref 4.0–10.5)

## 2012-03-31 MED ORDER — PANTOPRAZOLE SODIUM 40 MG PO TBEC: 40.0000 mg | DELAYED_RELEASE_TABLET | Freq: Every day | ORAL | Status: DC

## 2012-03-31 NOTE — Care Management Note (Signed)
    Page 1 of 1   03/31/2012     2:20:52 PM   CARE MANAGEMENT NOTE 03/31/2012  Patient:  [name], [name]   Account Number:  [id number]  Date Initiated:  03/31/2012  Documentation initiated by:  Sharrie Rothman  Subjective/Objective Assessment:   Pt admitted from home with SBO. Pt lives with his wife and will return home at discharge. Pt is currently active with [facility] for urostomy care and supplies. Pt follows up with [facility] for primary care.     Action/Plan:   CM notified HH of pts admission. Weekend staff will need to fax new HH orders and discharge summary when pt is discharged to [phone].   Anticipated DC Date:  04/01/2012   Anticipated DC Plan:  HOME W HOME HEALTH SERVICES      DC Planning Services  CM consult      [facility] Choice  HOME HEALTH   Choice offered to / List presented to:  C-1 Patient        HH arranged  HH-1 RN      [facility] agency  [facility]   Status of service:  Completed, signed off Medicare Important Message given?   (If response is "NO", the following Medicare IM given date fields will be blank) Date Medicare IM given:   Date Additional Medicare IM given:    Discharge Disposition:  HOME W HOME HEALTH SERVICES  Per UR Regulation:    If discussed at Long Length of Stay Meetings, dates discussed:    Comments:  03/31/12 1408 Arlyss Queen, RN BSN CM

## 2012-03-31 NOTE — Progress Notes (Signed)
Pt discharged home today per Dr. Leticia Penna. Pt's IV site D/C'd and WNL. Pt's VS stable at this time. Pt provided with home medication list and discharge instructions. Verbalized understanding. Pt made aware that he could call Dr. Illene Regulus office at any time if he would like to make follow up appointment, but it was not mandatory. Verbalized understanding. Pt left floor via WC in stable condition accompanied by NT.

## 2012-03-31 NOTE — Progress Notes (Signed)
Subjective: Symptoms have resolved. Positive bowel movement. No nausea. No abdominal pain. Tolerating clears. Patient states he is hungry.  Objective: Vital signs in last 24 hours: Temp:  [97.2 F (36.2 C)-98.2 F (36.8 C)] 97.4 F (36.3 C) (01/17 0600) Pulse Rate:  [65-78] 65  (01/17 0600) Resp:  [18] 18  (01/17 0600) BP: (127-143)/(65-85) 127/75 mmHg (01/17 0600) SpO2:  [95 %-98 %] 95 % (01/17 0600) Last BM Date: 03/28/12  Intake/Output from previous day: 01/16 0701 - 01/17 0700 In: 4600.3 [P.O.:1110; I.V.:3490.3] Out: 1050 [Urine:1050] Intake/Output this shift: Total I/O In: -  Out: 2700 [Urine:2700]  General appearance: alert and no distress GI: soft, non-tender; bowel sounds normal; no masses,  no organomegaly  Lab Results:   Basename 03/30/12 0447 03/29/12 1329  WBC 7.2 8.7  HGB 15.2 15.7  HCT 44.6 46.0  PLT 234 242   BMET  Basename 03/30/12 0447 03/29/12 1329  NA 139 139  K 3.9 4.4  CL 105 103  CO2 25 27  GLUCOSE 91 105*  BUN 12 15  CREATININE 1.01 1.15  CALCIUM 8.9 9.6   PT/INR No results found for this basename: LABPROT:2,INR:2 in the last 72 hours ABG No results found for this basename: PHART:2,PCO2:2,PO2:2,HCO3:2 in the last 72 hours  Studies/Results: Ct Abdomen Pelvis W Contrast  03/29/2012  *RADIOLOGY REPORT*  Clinical Data: Abdominal pain, nausea, history of bladder carcinoma post resection.  CT ABDOMEN AND PELVIS WITH CONTRAST  Technique:  Multidetector CT imaging of the abdomen and pelvis was performed following the standard protocol during bolus administration of intravenous contrast.  Contrast: 50mL OMNIPAQUE IOHEXOL 300 MG/ML  SOLN, OMNIPAQUE IOHEXOL 300 MG/ML  SOLN  Comparison: 01/07/2012  Findings: Minimal dependent atelectasis in the visualized lung bases.  Improvement in the linear scarring or atelectasis in the inferior lingula.  Liver has a mildly nodular contour without focal lesion or intrahepatic biliary ductal dilatation.   Unremarkable nondistended gallbladder, spleen, adrenal glands, kidneys, pancreas, abdominal aorta, portal vein.  Stomach is physiologically distended. Mild distention of proximal small bowel with incomplete passage of oral contrast material.  Small umbilical hernia involving a knuckle of small bowel with some adjacent fluid. There is  no oral contrast progression beyond this level, distal small bowel decompressed. Ileal conduit in the right lower quadrant with a peristomal hernia of mesenteric fat.   Normal appendix.     Colon   nondilated.   Scattered distal descending and sigmoid diverticula without adjacent inflammatory/edematous change. Previous cystectomy.  Interval enlargement of   left iliac lymph nodes. Index proximal left external iliac node image 74/2 15 mm short axis diameter, previously 5 mm.  Distal left common iliac chain node image 73/2 1 cm diameter, previously 5 mm.  Preaortic poorly marginated node image 56   11 mm short axis diameter, previously 4 mm.  There are regional inflammatory/edematous changes extending along the posterior left pelvic sidewall and the presacral region along the common iliac vessels. No ascites. No free air.  Advanced facet degenerative changes in the lower lumbar spine.  Regional bones unremarkable.  IMPRESSION:  1.  Umbilical hernia involving a knuckle of distal small bowel with transition from   distended to decompressed bowel suggesting a degree of obstruction. 2.  New para-aortic and left iliac adenopathy with adjacent inflammatory/edematous changes, suggesting progressive metastatic disease or lymphoma. 3.  Worsening parastomal hernia around the ileostomy without obstruction.   Original Report Authenticated By: D. Andria Rhein, MD     Anti-infectives: Anti-infectives  None      Assessment/Plan: s/p * No surgery found * SBO. Resolving. Advance diet. Possible discharge later today. We have discussed possible hernia repair in the near future. If patient  tolerates today's in the diet we will followup as an outpatient to further discuss surgical options.  LOS: 2 days    Jodeci Roarty C 03/31/2012

## 2012-03-31 NOTE — Progress Notes (Signed)
The patient is receiving Protonix by the intravenous route.  Based on criteria approved by the Pharmacy and Therapeutics Committee and the Medical Executive Committee, the medication is being converted to the equivalent oral dose form.  These criteria include: -No Active GI bleeding -Able to tolerate diet of full liquids (or better) or tube feeding OR able to tolerate other medications by the oral or enteral route  If you have any questions about this conversion, please contact the Pharmacy Department (ext [phone]).  Thank you.  Mady Gemma, [facility] 03/31/2012 1:06 PM

## 2012-04-16 ENCOUNTER — Encounter (HOSPITAL_COMMUNITY): Payer: Self-pay | Admitting: Emergency Medicine

## 2012-04-16 ENCOUNTER — Emergency Department (HOSPITAL_COMMUNITY)
Admission: EM | Admit: 2012-04-16 | Discharge: 2012-04-16 | Disposition: A | Discharge status: Home / Self Care | Payer: Medicaid Other | Attending: Emergency Medicine | Admitting: Emergency Medicine

## 2012-04-16 DIAGNOSIS — Z8619 Personal history of other infectious and parasitic diseases: Secondary | ICD-10-CM | POA: Insufficient documentation

## 2012-04-16 DIAGNOSIS — Z79899 Other long term (current) drug therapy: Secondary | ICD-10-CM | POA: Insufficient documentation

## 2012-04-16 DIAGNOSIS — Z87448 Personal history of other diseases of urinary system: Secondary | ICD-10-CM | POA: Insufficient documentation

## 2012-04-16 DIAGNOSIS — Z8551 Personal history of malignant neoplasm of bladder: Secondary | ICD-10-CM | POA: Insufficient documentation

## 2012-04-16 DIAGNOSIS — Z87891 Personal history of nicotine dependence: Secondary | ICD-10-CM | POA: Insufficient documentation

## 2012-04-16 DIAGNOSIS — Z8719 Personal history of other diseases of the digestive system: Secondary | ICD-10-CM | POA: Insufficient documentation

## 2012-04-16 DIAGNOSIS — E669 Obesity, unspecified: Secondary | ICD-10-CM | POA: Insufficient documentation

## 2012-04-16 DIAGNOSIS — K429 Umbilical hernia without obstruction or gangrene: Secondary | ICD-10-CM | POA: Insufficient documentation

## 2012-04-16 DIAGNOSIS — Z8709 Personal history of other diseases of the respiratory system: Secondary | ICD-10-CM | POA: Insufficient documentation

## 2012-04-16 LAB — LIPASE, BLOOD: Lipase: 34 U/L (ref 11–59)

## 2012-04-16 LAB — CBC WITH DIFFERENTIAL/PLATELET
Eosinophils Absolute: 0.2 10*3/uL (ref 0.0–0.7)
HCT: 46.6 % (ref 39.0–52.0)
Hemoglobin: 15.8 g/dL (ref 13.0–17.0)
Lymphs Abs: 1.5 10*3/uL (ref 0.7–4.0)
MCH: 27.2 pg (ref 26.0–34.0)
MCV: 80.2 fL (ref 78.0–100.0)
Monocytes Absolute: 0.5 10*3/uL (ref 0.1–1.0)
Monocytes Relative: 6 % (ref 3–12)
Neutrophils Relative %: 74 % (ref 43–77)
RBC: 5.81 MIL/uL (ref 4.22–5.81)

## 2012-04-16 LAB — COMPREHENSIVE METABOLIC PANEL
Alkaline Phosphatase: 99 U/L (ref 39–117)
BUN: 15 mg/dL (ref 6–23)
GFR calc Af Amer: 83 mL/min — ABNORMAL LOW (ref >90)
Glucose, Bld: 156 mg/dL — ABNORMAL HIGH (ref 70–99)
Potassium: 4.1 mEq/L (ref 3.5–5.1)
Total Bilirubin: 0.3 mg/dL (ref 0.3–1.2)
Total Protein: 8.1 g/dL (ref 6.0–8.3)

## 2012-04-16 NOTE — ED Provider Notes (Signed)
History    CSN: [id number] Arrival date & time 04/16/12  1258 First MD Initiated Contact with Patient 04/16/12 1309      Chief Complaint  Patient presents with  . Hernia    HPI Patient presents to the emergency room with complaints of abdominal pain radiating to his back. The symptoms started today. He noticed that the umbilical hernia that he has was sticking out again. The patient had this same pain that he has today when he had issues with his hernia in the past. He denies any trouble with nausea vomiting diarrhea or constipation. He does have palpation primarily around the periumbilical region. The pain increases with palpation and movement. Patient does not recall doing anything particularly strenuous when the pain started today. Past Medical History  Diagnosis Date  . Gross hematuria   . C. difficile enteritis 07/04/2011  . Diverticulitis   . Subcutaneous emphysema, postoperative 01/07/2012  . Cirrhosis 01/07/2012    Radiographically  . Splenomegaly   . 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 Acute pyelonephritis 01/07/2012    Past Surgical History  Procedure Date  . Elbow surgery   . Transurethral resection of bladder tumor 10/26/2011    Procedure: TRANSURETHRAL RESECTION OF BLADDER TUMOR (TURBT);  Surgeon: Ky Barban, MD;  Location: [facility];  Service: Urology;  Laterality: N/A;  . Transurethral resection of prostate 10/26/2011    Procedure: TRANSURETHRAL RESECTION OF THE PROSTATE (TURP);  Surgeon: Ky Barban, MD;  Location: [facility];  Service: Urology;  Laterality: N/A;    Family History  Problem Relation Age of Onset  . Heart failure Father   . Hypertension Mother     History  Substance Use Topics  . Smoking status: Former Smoker    Quit date: 12/03/2007  . Smokeless tobacco: Not on file  . Alcohol Use: No      Review of  Systems  All other systems reviewed and are negative.    Allergies  Review of patient's allergies indicates no known allergies.  Home Medications   Current Outpatient Rx  Name  Route  Sig  Dispense  Refill  . ALPRAZOLAM 0.5 MG PO TABS   Oral   Take 0.5 mg by mouth 2 (two) times daily as needed. Panic symptoms         . DOCUSATE SODIUM 100 MG PO CAPS   Oral   Take 100 mg by mouth daily.         Marland Kitchen MIRTAZAPINE 15 MG PO TABS   Oral   Take 15 mg by mouth at bedtime.           BP 160/102  Pulse 76  Temp 97.4 F (36.3 C) (Oral)  Resp 16  Ht 6\' 1"  (1.854 m)  Wt 220 lb (99.791 kg)  BMI 29.03 kg/m2  SpO2 97%  Physical Exam  Nursing note and vitals reviewed. Constitutional: No distress.       obese  HENT:  Head: Normocephalic and atraumatic.  Right Ear: External ear normal.  Left Ear: External ear normal.  Eyes: Conjunctivae normal are normal. Right eye exhibits no discharge. Left eye exhibits no discharge. No scleral icterus.  Neck: Neck supple. No tracheal deviation present.  Cardiovascular: Normal rate, regular rhythm and intact distal pulses.   Pulmonary/Chest: Effort normal and breath sounds normal. No stridor. No respiratory distress. He has no wheezes.  He has no rales.  Abdominal: Soft. Bowel sounds are normal. He exhibits no distension. There is no tenderness. There is no rebound and no guarding. A hernia is present.    Musculoskeletal: He exhibits no edema and no tenderness.  Neurological: He is alert. He has normal strength. No sensory deficit. Cranial nerve deficit:  no gross defecits noted. He exhibits normal muscle tone. He displays no seizure activity. Coordination normal.  Skin: Skin is warm and dry. No rash noted. He is not diaphoretic.  Psychiatric: He has a normal mood and affect.    ED Course  Procedures (including critical care time) The patient's pain resolved after I palpated and reduced his umbilical hernia. Labs Reviewed  COMPREHENSIVE  METABOLIC PANEL - Abnormal; Notable for the following:    Glucose, Bld 156 (*)     AST 64 (*)     GFR calc non Af Amer 72 (*)     GFR calc Af Amer 83 (*)     All other components within normal limits  CBC WITH DIFFERENTIAL  LIPASE, BLOOD   No results found.   1. Umbilical hernia       MDM  I reviewed the patient's most recent hospital admission in the middle of last month. He was noted to have a parastomal hernia as well as  An umbilical hernia that was felt to have a degree of incarceration obstruction. The patient was admitted and was placed on bowel rest. This hernia did reduce. Patient was discharged home with recommendations for outpatient follow up as needed.  The patient's hernia was easily reduced today. He has not had any trouble with nausea or vomiting. I encouraged to follow up either with Dr. Leticia Penna or the surgeons he was seen in [place]. Patient may require elective repair of this hernia. Fortunately at this time, he does not appear to have any evidence of an acute obstruction and can safely be discharged to followup as an outpatient        Celene Kras, MD 04/18/12 1719

## 2012-04-16 NOTE — Discharge Instructions (Signed)
Hernia A hernia occurs when an internal organ pushes out through a weak spot in the abdominal wall. Hernias most commonly occur in the groin and around the navel. Hernias often can be pushed back into place (reduced). Most hernias tend to get worse over time. Some abdominal hernias can get stuck in the opening (irreducible or incarcerated hernia) and cannot be reduced. An irreducible abdominal hernia which is tightly squeezed into the opening is at risk for impaired blood supply (strangulated hernia). A strangulated hernia is a medical emergency. Because of the risk for an irreducible or strangulated hernia, surgery may be recommended to repair a hernia. CAUSES   Heavy lifting.  Prolonged coughing.  Straining to have a bowel movement.  A cut (incision) made during an abdominal surgery. HOME CARE INSTRUCTIONS   Bed rest is not required. You may continue your normal activities.  Avoid lifting more than 10 pounds (4.5 kg) or straining.  Cough gently. If you are a smoker it is best to stop. Even the best hernia repair can break down with the continual strain of coughing. Even if you do not have your hernia repaired, a cough will continue to aggravate the problem.  Do not wear anything tight over your hernia. Do not try to keep it in with an outside bandage or truss. These can damage abdominal contents if they are trapped within the hernia sac.  Eat a normal diet.  Avoid constipation. Straining over long periods of time will increase hernia size and encourage breakdown of repairs. If you cannot do this with diet alone, stool softeners may be used. SEEK IMMEDIATE MEDICAL CARE IF:   You have a fever.  You develop increasing abdominal pain.  You feel nauseous or vomit.  Your hernia is stuck outside the abdomen, looks discolored, feels hard, or is tender.  You have any changes in your bowel habits or in the hernia that are unusual for you.  You have increased pain or swelling around the  hernia.  You cannot push the hernia back in place by applying gentle pressure while lying down. MAKE SURE YOU:   Understand these instructions.  Will watch your condition.  Will get help right away if you are not doing well or get worse. Document Released: 03/01/2005 Document Revised: 05/24/2011 Document Reviewed: 10/19/2007 Exit[employer] Patient Information 2013 [employer].  

## 2012-04-16 NOTE — ED Notes (Signed)
Pt here for abd pain radiating to back. States he has a hernia. Denies n/v/d/constipation today.

## 2012-04-19 NOTE — Discharge Summary (Signed)
Physician Discharge Summary  Patient ID: [name] MRN: [id number] DOB/AGE: 09-10-1955 57 y.o.  Admit date: 03/29/2012 Discharge date: 03/31/2012  Admission Diagnoses:  Small bowel obstruction  Discharge Diagnoses: the same, resolved. Active Problems:  * No active hospital problems. *    Discharged Condition: stable  Hospital Course: Patient presented with nausea, vomiting and abdominal pain.  Work-up was consistent with SBO.  Was treated conservatively.  Began to have resolution of symptoms and return of bowel function.  He was advanced on a diet.  Tolerated well.  Patient was discharge.  Consults: None  Significant Diagnostic Studies: labs: daily  Treatments: IV hydration  Discharge Exam: Blood pressure 127/75, pulse 65, temperature 97.4 F (36.3 C), temperature source Oral, resp. rate 18, height 6\' 1"  (1.854 m), weight 102.7 kg (226 lb 6.6 oz), SpO2 95.00%. General appearance: alert and no distress Resp: clear to auscultation bilaterally Cardio: regular rate and rhythm GI: soft, non-tender; bowel sounds normal; no masses,  no organomegaly  Disposition: 01-Home or Self Care  Discharge Orders    Future Orders Please Complete By Expires   Diet - low sodium heart healthy      Increase activity slowly      Discharge instructions      Comments:   Low residual diet.       Medication List     As of 04/19/2012 10:51 PM    STOP taking these medications         ciprofloxacin 500 MG tablet   Commonly known as: CIPRO           Follow-up Information    Follow up with Taima Rada C, MD. In 2 weeks. (As needed)    Contact information:   [address] [place] [address] [phone]          Signed: Fabio Bering 58/07/2012, 10:51 PM

## 2012-05-18 ENCOUNTER — Encounter (HOSPITAL_COMMUNITY): Payer: Self-pay | Admitting: *Deleted

## 2012-05-18 ENCOUNTER — Emergency Department (HOSPITAL_COMMUNITY)
Admission: EM | Admit: 2012-05-18 | Discharge: 2012-05-18 | Disposition: A | Discharge status: Home / Self Care | Payer: Medicaid Other | Attending: Emergency Medicine | Admitting: Emergency Medicine

## 2012-05-18 DIAGNOSIS — Z87448 Personal history of other diseases of urinary system: Secondary | ICD-10-CM | POA: Insufficient documentation

## 2012-05-18 DIAGNOSIS — Z79899 Other long term (current) drug therapy: Secondary | ICD-10-CM | POA: Insufficient documentation

## 2012-05-18 DIAGNOSIS — Z8619 Personal history of other infectious and parasitic diseases: Secondary | ICD-10-CM | POA: Insufficient documentation

## 2012-05-18 DIAGNOSIS — Z8719 Personal history of other diseases of the digestive system: Secondary | ICD-10-CM | POA: Insufficient documentation

## 2012-05-18 DIAGNOSIS — Z87891 Personal history of nicotine dependence: Secondary | ICD-10-CM | POA: Insufficient documentation

## 2012-05-18 DIAGNOSIS — Z8551 Personal history of malignant neoplasm of bladder: Secondary | ICD-10-CM | POA: Insufficient documentation

## 2012-05-18 DIAGNOSIS — Z9889 Other specified postprocedural states: Secondary | ICD-10-CM | POA: Insufficient documentation

## 2012-05-18 DIAGNOSIS — R1033 Periumbilical pain: Secondary | ICD-10-CM | POA: Insufficient documentation

## 2012-05-18 DIAGNOSIS — K429 Umbilical hernia without obstruction or gangrene: Secondary | ICD-10-CM | POA: Insufficient documentation

## 2012-05-18 MED ORDER — OXYCODONE-ACETAMINOPHEN 5-325 MG PO TABS
1.0000 | ORAL_TABLET | Freq: Once | ORAL | Status: AC
  Administered 2012-05-18: 1 via ORAL
  Filled 2012-05-18: qty 1

## 2012-05-18 NOTE — ED Provider Notes (Signed)
History     CSN: [id number]  Arrival date & time 05/18/12  1806   First MD Initiated Contact with Patient 05/18/12 1921      Chief Complaint  Patient presents with  . Hernia    (Consider location/radiation/quality/duration/timing/severity/associated sxs/prior treatment) HPI Pt with history of bladder and prostate resection in Oct 2013 now has a RLQ ostomy and a known umbilical hernia. His surgeons have told him they want to delay repair of his hernia until his other surgery has had ample time to heal. He reports his umbilical hernia popped out earlier today and he has been unable to reduce it himself. He has moderate, sharp pain to umbilicus, but no vomiting or fever.   Past Medical History  Diagnosis Date  . Gross hematuria   . C. difficile enteritis 07/04/2011  . Diverticulitis   . Subcutaneous emphysema, postoperative 01/07/2012  . Cirrhosis 01/07/2012    Radiographically  . Splenomegaly   . 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 Acute pyelonephritis 01/07/2012    Past Surgical History  Procedure Laterality Date  . Elbow surgery    . Transurethral resection of bladder tumor  10/26/2011    Procedure: TRANSURETHRAL RESECTION OF BLADDER TUMOR (TURBT);  Surgeon: Ky Barban, MD;  Location: [facility];  Service: Urology;  Laterality: N/A;  . Transurethral resection of prostate  10/26/2011    Procedure: TRANSURETHRAL RESECTION OF THE PROSTATE (TURP);  Surgeon: Ky Barban, MD;  Location: [facility];  Service: Urology;  Laterality: N/A;    Family History  Problem Relation Age of Onset  . Heart failure Father   . Hypertension Mother     History  Substance Use Topics  . Smoking status: Former Smoker    Quit date: 12/03/2007  . Smokeless tobacco: Not on file  . Alcohol Use: No      Review of Systems All other systems reviewed and are  negative except as noted in HPI.   Allergies  Review of patient's allergies indicates no known allergies.  Home Medications   Current Outpatient Rx  Name  Route  Sig  Dispense  Refill  . ALPRAZolam (XANAX) 0.5 MG tablet   Oral   Take 0.5 mg by mouth 2 (two) times daily as needed. Panic symptoms         . docusate sodium (COLACE) 100 MG capsule   Oral   Take 100 mg by mouth daily.         . mirtazapine (REMERON) 15 MG tablet   Oral   Take 15 mg by mouth at bedtime.           BP 146/79  Pulse 85  Temp(Src) 97.9 F (36.6 C) (Oral)  Resp 20  Ht 5\' 11"  (1.803 m)  Wt 227 lb (102.967 kg)  BMI 31.67 kg/m2  SpO2 97%  Physical Exam  Nursing note and vitals reviewed. Constitutional: He is oriented to person, place, and time. He appears well-developed and well-nourished.  HENT:  Head: Normocephalic and atraumatic.  Eyes: EOM are normal. Pupils are equal, round, and reactive to light.  Neck: Normal range of motion. Neck supple.  Cardiovascular: Normal rate, normal heart sounds and intact distal pulses.   Pulmonary/Chest: Effort normal and breath sounds normal.  Abdominal: Bowel sounds are normal. He exhibits mass (small umbilical hernia, easily reduced in Trendelenburg position). He exhibits no distension. There  is tenderness. There is no rebound and no guarding.  Normal, healthy ostomy in RLQ  Musculoskeletal: Normal range of motion. He exhibits no edema and no tenderness.  Neurological: He is alert and oriented to person, place, and time. He has normal strength. No cranial nerve deficit or sensory deficit.  Skin: Skin is warm and dry. No rash noted.  Psychiatric: He has a normal mood and affect.    ED Course  Procedures (including critical care time)  Labs Reviewed - No data to display No results found.   No diagnosis found.    MDM  Pain resolved after reducing hernia. Pt advised to lie flat for a few minutes before getting up. He was advised to hold pressure  on that area when coughing, sneezing, or moving positions and to follow up with his surgeons. He was advised to return to the ED if he is unable to reduce his hernia, has vomiting or has any other concerns.         Valeska Haislip B. Bernette Mayers, MD 05/18/12 9604

## 2012-05-18 NOTE — ED Notes (Signed)
Patient would like something for pain and his urine checked for a Urinary tract infection. RN made aware.

## 2012-05-18 NOTE — ED Notes (Signed)
Umbilical hernia that pops in and out.  States popped out approx 1 hr ago and will not go back in.   C/o pain to area.

## 2012-05-18 NOTE — ED Notes (Signed)
Pt changed mind about checking his urine for UTI when made aware that he was up for discharge.

## 2012-05-28 ENCOUNTER — Encounter (HOSPITAL_COMMUNITY): Payer: Self-pay

## 2012-05-28 ENCOUNTER — Emergency Department (HOSPITAL_COMMUNITY): Payer: Medicaid Other

## 2012-05-28 ENCOUNTER — Emergency Department (HOSPITAL_COMMUNITY)
Admission: EM | Admit: 2012-05-28 | Discharge: 2012-05-28 | Disposition: A | Discharge status: Home / Self Care | Payer: Medicaid Other | Attending: Emergency Medicine | Admitting: Emergency Medicine

## 2012-05-28 DIAGNOSIS — Z8619 Personal history of other infectious and parasitic diseases: Secondary | ICD-10-CM | POA: Insufficient documentation

## 2012-05-28 DIAGNOSIS — K5732 Diverticulitis of large intestine without perforation or abscess without bleeding: Secondary | ICD-10-CM | POA: Insufficient documentation

## 2012-05-28 DIAGNOSIS — Z87891 Personal history of nicotine dependence: Secondary | ICD-10-CM | POA: Insufficient documentation

## 2012-05-28 DIAGNOSIS — Z79899 Other long term (current) drug therapy: Secondary | ICD-10-CM | POA: Insufficient documentation

## 2012-05-28 DIAGNOSIS — Z8551 Personal history of malignant neoplasm of bladder: Secondary | ICD-10-CM | POA: Insufficient documentation

## 2012-05-28 DIAGNOSIS — R911 Solitary pulmonary nodule: Secondary | ICD-10-CM | POA: Insufficient documentation

## 2012-05-28 DIAGNOSIS — Z8719 Personal history of other diseases of the digestive system: Secondary | ICD-10-CM | POA: Insufficient documentation

## 2012-05-28 DIAGNOSIS — R220 Localized swelling, mass and lump, head: Secondary | ICD-10-CM | POA: Insufficient documentation

## 2012-05-28 DIAGNOSIS — R509 Fever, unspecified: Secondary | ICD-10-CM | POA: Insufficient documentation

## 2012-05-28 DIAGNOSIS — Z9889 Other specified postprocedural states: Secondary | ICD-10-CM | POA: Insufficient documentation

## 2012-05-28 DIAGNOSIS — K5792 Diverticulitis of intestine, part unspecified, without perforation or abscess without bleeding: Secondary | ICD-10-CM

## 2012-05-28 DIAGNOSIS — R002 Palpitations: Secondary | ICD-10-CM | POA: Insufficient documentation

## 2012-05-28 LAB — CBC WITH DIFFERENTIAL/PLATELET
Basophils Absolute: 0 10*3/uL (ref 0.0–0.1)
Basophils Relative: 0 % (ref 0–1)
Eosinophils Relative: 1 % (ref 0–5)
HCT: 43.6 % (ref 39.0–52.0)
MCHC: 34.2 g/dL (ref 30.0–36.0)
Monocytes Absolute: 1.1 10*3/uL — ABNORMAL HIGH (ref 0.1–1.0)
Neutro Abs: 10.3 10*3/uL — ABNORMAL HIGH (ref 1.7–7.7)
Platelets: 276 10*3/uL (ref 150–400)
RDW: 15.1 % (ref 11.5–15.5)
WBC: 13.7 10*3/uL — ABNORMAL HIGH (ref 4.0–10.5)

## 2012-05-28 LAB — URINALYSIS, ROUTINE W REFLEX MICROSCOPIC
Bilirubin Urine: NEGATIVE
Ketones, ur: NEGATIVE mg/dL
Nitrite: POSITIVE — AB
Protein, ur: NEGATIVE mg/dL
Urobilinogen, UA: 0.2 mg/dL (ref 0.0–1.0)
pH: 7 (ref 5.0–8.0)

## 2012-05-28 LAB — POCT I-STAT, CHEM 8
Calcium, Ion: 1.21 mmol/L (ref 1.12–1.23)
HCT: 44 % (ref 39.0–52.0)
Sodium: 142 mEq/L (ref 135–145)
TCO2: 26 mmol/L (ref 0–100)

## 2012-05-28 LAB — URINE MICROSCOPIC-ADD ON

## 2012-05-28 MED ORDER — METRONIDAZOLE 500 MG PO TABS: 500.0000 mg | ORAL_TABLET | Freq: Two times a day (BID) | ORAL | Status: DC

## 2012-05-28 MED ORDER — IOHEXOL 300 MG/ML  SOLN
100.0000 mL | Freq: Once | INTRAMUSCULAR | Status: AC | PRN
  Administered 2012-05-28: 100 mL via INTRAVENOUS

## 2012-05-28 MED ORDER — OXYCODONE-ACETAMINOPHEN 5-325 MG PO TABS: 1.0000 | ORAL_TABLET | ORAL | Status: DC | PRN

## 2012-05-28 MED ORDER — MORPHINE SULFATE 4 MG/ML IJ SOLN
4.0000 mg | Freq: Once | INTRAMUSCULAR | Status: AC
  Administered 2012-05-28: 4 mg via INTRAVENOUS
  Filled 2012-05-28: qty 1

## 2012-05-28 MED ORDER — CIPROFLOXACIN HCL 500 MG PO TABS: 500.0000 mg | ORAL_TABLET | Freq: Two times a day (BID) | ORAL | Status: DC

## 2012-05-28 MED ORDER — CIPROFLOXACIN IN D5W 400 MG/200ML IV SOLN
400.0000 mg | Freq: Once | INTRAVENOUS | Status: AC
  Administered 2012-05-28: 400 mg via INTRAVENOUS
  Filled 2012-05-28: qty 200

## 2012-05-28 MED ORDER — SODIUM CHLORIDE 0.9 % IV BOLUS (SEPSIS)
1000.0000 mL | Freq: Once | INTRAVENOUS | Status: AC
  Administered 2012-05-28: 1000 mL via INTRAVENOUS

## 2012-05-28 MED ORDER — NAPROXEN 500 MG PO TABS: 500.0000 mg | ORAL_TABLET | Freq: Two times a day (BID) | ORAL | Status: DC

## 2012-05-28 MED ORDER — ONDANSETRON HCL 4 MG/2ML IJ SOLN
4.0000 mg | Freq: Once | INTRAMUSCULAR | Status: AC
  Administered 2012-05-28: 4 mg via INTRAVENOUS
  Filled 2012-05-28: qty 2

## 2012-05-28 MED ORDER — ONDANSETRON HCL 4 MG/2ML IJ SOLN: 4.0000 mg | Freq: Once | INTRAMUSCULAR | Status: AC

## 2012-05-28 MED ORDER — IOHEXOL 300 MG/ML  SOLN
50.0000 mL | Freq: Once | INTRAMUSCULAR | Status: AC | PRN
  Administered 2012-05-28: 50 mL via ORAL

## 2012-05-28 MED ORDER — METRONIDAZOLE 500 MG PO TABS
500.0000 mg | ORAL_TABLET | Freq: Once | ORAL | Status: AC
  Administered 2012-05-28: 500 mg via ORAL
  Filled 2012-05-28: qty 1

## 2012-05-28 NOTE — ED Notes (Signed)
Left flank pain and fever

## 2012-05-28 NOTE — ED Provider Notes (Signed)
History  This chart was scribed for Edwin Roller, MD by Bennett Scrape, ED Scribe. This patient was seen in room APA15/APA15 and the patient's care was started at 3:36 PM.  CSN: [id number]  Arrival date & time 05/28/12  1311   First MD Initiated Contact with Patient 05/28/12 1536      Chief Complaint  Patient presents with  . Flank Pain    The history is provided by the patient. No language interpreter was used.    [name] is a 57 y.o. male who presents to the Emergency Department complaining of gradual onset, gradually worsening, constant left lower quadrant abdominal pain described as sharp with associated chills and subjective fever that started last yesterday evening. He reports that the pain is worse with palpation. He reports that he has been able to eat his normal diet since the onset. He has a h/o resection of bladder and prostate due to CA and has a urostomy currently. He states that he gets UTIs frequently from the urostomy but reports that this pain is in a different area. He denies having a h/o diverticulitis. He denies nausea, emesis and diarrhea as associated symptoms. He has a mobile mass in his left neck that he reports has been biopsied before. He reports that he is following up with a dermatologist for the same. He is a former smoker but denies alcohol use.  PCP is [facility]  Past Medical History  Diagnosis Date  . Gross hematuria   . C. difficile enteritis 07/04/2011  . Diverticulitis   . Subcutaneous emphysema, postoperative 01/07/2012  . Cirrhosis 01/07/2012    Radiographically  . Splenomegaly   . 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 Acute pyelonephritis 01/07/2012    Past Surgical History  Procedure Laterality Date  . Elbow surgery    . Transurethral resection of bladder tumor  10/26/2011    Procedure:  TRANSURETHRAL RESECTION OF BLADDER TUMOR (TURBT);  Surgeon: Ky Barban, MD;  Location: [facility];  Service: Urology;  Laterality: N/A;  . Transurethral resection of prostate  10/26/2011    Procedure: TRANSURETHRAL RESECTION OF THE PROSTATE (TURP);  Surgeon: Ky Barban, MD;  Location: [facility];  Service: Urology;  Laterality: N/A;    Family History  Problem Relation Age of Onset  . Heart failure Father   . Hypertension Mother     History  Substance Use Topics  . Smoking status: Former Smoker    Quit date: 12/03/2007  . Smokeless tobacco: Not on file  . Alcohol Use: No      Review of Systems  Constitutional: Positive for fever and chills.  HENT: Negative for neck pain.   Gastrointestinal: Positive for abdominal pain. Negative for nausea, vomiting and diarrhea.  Neurological: Negative for dizziness.  All other systems reviewed and are negative.    Allergies  Review of patient's allergies indicates no known allergies.  Home Medications   Current Outpatient Rx  Name  Route  Sig  Dispense  Refill  . ALPRAZolam (XANAX) 0.5 MG tablet   Oral   Take 0.5 mg by mouth 2 (two) times daily as needed. Panic symptoms         . mirtazapine (REMERON) 15 MG tablet   Oral   Take 15 mg by mouth at bedtime.         . ciprofloxacin (CIPRO)  500 MG tablet   Oral   Take 1 tablet (500 mg total) by mouth every 12 (twelve) hours.   20 tablet   0   . docusate sodium (COLACE) 100 MG capsule   Oral   Take 100 mg by mouth daily.         . metroNIDAZOLE (FLAGYL) 500 MG tablet   Oral   Take 1 tablet (500 mg total) by mouth 2 (two) times daily.   20 tablet   0   . naproxen (NAPROSYN) 500 MG tablet   Oral   Take 1 tablet (500 mg total) by mouth 2 (two) times daily with a meal.   30 tablet   0   . oxyCODONE-acetaminophen (PERCOCET) 5-325 MG per tablet   Oral   Take 1 tablet by mouth every 4 (four) hours as needed for pain.   20 tablet   0     Triage Vitals: BP 119/68   Pulse 78  Temp(Src) 98.2 F (36.8 C) (Oral)  Resp 16  Ht 5\' 11"  (1.803 m)  Wt 230 lb (104.327 kg)  BMI 32.09 kg/m2  SpO2 95%  Physical Exam  Nursing note and vitals reviewed. Constitutional: He is oriented to person, place, and time. He appears well-developed and well-nourished. No distress.  HENT:  Head: Normocephalic and atraumatic.  Mouth/Throat: Oropharynx is clear and moist.  Eyes: Conjunctivae and EOM are normal. Pupils are equal, round, and reactive to light.  Neck: Normal range of motion. Neck supple. No tracheal deviation present.  2 cm mobile mass in his left neck under his chin  Cardiovascular: Normal rate, regular rhythm and normal heart sounds.  Exam reveals no gallop and no friction rub.   No murmur heard. Pulmonary/Chest: Effort normal and breath sounds normal. No respiratory distress.  Abdominal: Soft. He exhibits no mass. There is tenderness (focal LLQ).  No right lower tenderness, urostomy is draining appropriately   Musculoskeletal: Normal range of motion.  Neurological: He is alert and oriented to person, place, and time.  Skin: Skin is warm and dry.  Psychiatric: He has a normal mood and affect. His behavior is normal.    ED Course  Procedures (including critical care time)  DIAGNOSTIC STUDIES: Oxygen Saturation is 95% on room air, adequate by my interpretation.    COORDINATION OF CARE: 3:49 PM-Discussed treatment plan which includes medications, CT of abdomen and CBC with pt at bedside and pt agreed to plan.   4:00 PM- Ordered 4 mg morphine injection and 1,000 mL of bolus  6:30 PM-Informed pt of radiology and lab results. Discussed discharge plan with pt and pt agreed.  Labs Reviewed  URINALYSIS, ROUTINE W REFLEX MICROSCOPIC - Abnormal; Notable for the following:    APPearance CLOUDY (*)    Hgb urine dipstick MODERATE (*)    Nitrite POSITIVE (*)    Leukocytes, UA SMALL (*)    All other components within normal limits  URINE MICROSCOPIC-ADD ON -  Abnormal; Notable for the following:    Bacteria, UA MANY (*)    All other components within normal limits  CBC WITH DIFFERENTIAL - Abnormal; Notable for the following:    WBC 13.7 (*)    Neutro Abs 10.3 (*)    Monocytes Absolute 1.1 (*)    All other components within normal limits  URINE CULTURE  POCT I-STAT, CHEM 8   Ct Abdomen Pelvis W Contrast  05/28/2012  *RADIOLOGY REPORT*  Clinical Data: Left lower quadrant abdominal pain.  Prior cystoprostatectomy for bladder  and prostate cancer.  CT ABDOMEN AND PELVIS WITH CONTRAST  Technique:  Multidetector CT imaging of the abdomen and pelvis was performed following the standard protocol during bolus administration of intravenous contrast.  Contrast: 50 ml Omnipaque-300 IV.  Oral contrast was also administered.  Comparison: CT abdomen and pelvis 03/29/2012, 01/07/2012, 09/15/2011, 07/02/2011, 08/10/2005.  Findings: Mild diffuse hepatic steatosis without focal hepatic parenchymal abnormality, with sparing surrounding the gallbladder fossa.  Normal-appearing spleen, pancreas, adrenal glands, and gallbladder.  No biliary ductal dilation.  Minimal bilateral hydronephrosis, unchanged.  No evidence of ureteral dilation, and the ureters can be followed into the ileal conduit in the right upper quadrant without obstruction.  No focal renal parenchymal abnormalities.  Mild bilateral iliofemoral atherosclerosis.  Interval increase in size of a left internal iliac node since the most recent examination, current measurements approximating 3.1 x 2.0 cm (series 2, image 69), previously 3.0 x 1.6 cm.  Streaky soft tissue in the retroperitoneum of the left side of the pelvis and the retroperitoneum of the upper abdomen.  Interval increase in size of a left periaortic lymph node approximately 4 cm above the aortic bifurcation, current measurements approximating 1.4 x 1.3 cm (image 49), previously less than 5 mm.  Stable aortocaval retroperitoneal lymph node measuring  approximately 0.9 x 1.7 cm (image 51).  Normal-appearing stomach and small bowel.  Edema/inflammation surrounding the proximal sigmoid colon in an area of diverticulosis.  No extraluminal gas or abnormal fluid collection. Remainder of the colon unremarkable, containing liquid stool. Normal appendix in the right upper pelvis.  No ascites.  Stable umbilical hernia containing a small amount of fluid.  Prostate gland and urinary bladder surgically absent.  No recurrent tumor in the pelvis at the operative site.  Approximate 4-5 mm nodule in the deep right lower lobe, not present on the prior examinations.  No other nodules within the visualized lung bases. Stable scarring in the left lower lobe and lingula. Bone window images demonstrate degenerative changes involving the lower lumbar spine but no evidence of osseous metastatic disease.  IMPRESSION:  1.  Acute diverticulitis involving the proximal sigmoid colon.  No evidence of perforation or abscess. 2.  Interval slight progression of retroperitoneal lymphadenopathy in the left side of the pelvis and in the abdomen since January, 2014. 3.  No evidence of recurrent tumor in the pelvis at the operative site of the cystoprostatectomy. 4.  Satisfactory appearance of the ileal conduit.  Stable minimal hydronephrosis involving both kidneys, as expected. 5.  New approximate 4-5 mm nodule in the deep right lower lobe, worrisome for metastasis.  At some point, a non-emergent CT of the chest is suggested to exclude other nodules. 6.  Mild diffuse hepatic steatosis.   Original Report Authenticated By: Hulan Saas, M.D.      1. Diverticulitis   2. Pulmonary nodule       MDM  Pt has LLQ pain on exam, no CVA ttp and has evidence on UA of UTI though he has a urostomy and likely has some chronic colonization.  Given his significant LLQ ttp, will need CT to r/o other sources of his pain such as diverticulitis or complication of his surgery.  VS stable at this time and pt  is giving very clear history.  Pain meds, fluids, labs and CT ordered.  Pt has had abx, pain meds, fluids   Review of results confirms diverticulitis - no signs of abscess or perforation - has pulm nodule - pt informed, given copy of results -  will f/u in AM.  PO trial passed.  I personally performed the services described in this documentation, which was scribed in my presence. The recorded information has been reviewed and is accurate.         Edwin Roller, MD 05/28/12 [phone]

## 2012-05-28 NOTE — Discharge Instructions (Signed)
Your CT scan shows that you have diverticulitis - see the attached reading instructions - It also shows that you have nodules in your lung which need further evaluation with a CT scan of your chest.  Call your medical doctor and cancer specialist in the morning to let them know ALL of your test results - I have also given you a CD of your CT scan to take to your doctor.    Pain medicines  Antibiotics  Return for worsening pain, high fevers or vomiting.  Please call your doctor for a followup appointment within 24-48 hours. When you talk to your doctor please let them know that you were seen in the emergency department and had a choir all of your record so that they can discuss the findings with you.

## 2012-05-30 LAB — URINE CULTURE: Colony Count: >=100000

## 2012-06-21 ENCOUNTER — Emergency Department (HOSPITAL_COMMUNITY): Payer: Medicaid Other

## 2012-06-21 ENCOUNTER — Encounter (HOSPITAL_COMMUNITY): Payer: Self-pay

## 2012-06-21 ENCOUNTER — Emergency Department (HOSPITAL_COMMUNITY)
Admission: EM | Admit: 2012-06-21 | Discharge: 2012-06-21 | Disposition: A | Discharge status: Home / Self Care | Payer: Medicaid Other | Attending: Emergency Medicine | Admitting: Emergency Medicine

## 2012-06-21 DIAGNOSIS — Z87891 Personal history of nicotine dependence: Secondary | ICD-10-CM | POA: Insufficient documentation

## 2012-06-21 DIAGNOSIS — M545 Low back pain, unspecified: Secondary | ICD-10-CM | POA: Insufficient documentation

## 2012-06-21 DIAGNOSIS — Z8551 Personal history of malignant neoplasm of bladder: Secondary | ICD-10-CM | POA: Insufficient documentation

## 2012-06-21 DIAGNOSIS — N390 Urinary tract infection, site not specified: Secondary | ICD-10-CM | POA: Insufficient documentation

## 2012-06-21 DIAGNOSIS — Z8719 Personal history of other diseases of the digestive system: Secondary | ICD-10-CM | POA: Insufficient documentation

## 2012-06-21 DIAGNOSIS — R1032 Left lower quadrant pain: Secondary | ICD-10-CM | POA: Insufficient documentation

## 2012-06-21 DIAGNOSIS — Z8619 Personal history of other infectious and parasitic diseases: Secondary | ICD-10-CM | POA: Insufficient documentation

## 2012-06-21 DIAGNOSIS — Z79899 Other long term (current) drug therapy: Secondary | ICD-10-CM | POA: Insufficient documentation

## 2012-06-21 LAB — COMPREHENSIVE METABOLIC PANEL
AST: 31 U/L (ref 0–37)
Albumin: 3.8 g/dL (ref 3.5–5.2)
Chloride: 101 mEq/L (ref 96–112)
Creatinine, Ser: 1.03 mg/dL (ref 0.50–1.35)
Potassium: 4 mEq/L (ref 3.5–5.1)
Sodium: 140 mEq/L (ref 135–145)
Total Bilirubin: 0.5 mg/dL (ref 0.3–1.2)

## 2012-06-21 LAB — URINALYSIS, ROUTINE W REFLEX MICROSCOPIC
Ketones, ur: NEGATIVE mg/dL
Leukocytes, UA: NEGATIVE
Nitrite: NEGATIVE
Protein, ur: NEGATIVE mg/dL
Urobilinogen, UA: 0.2 mg/dL (ref 0.0–1.0)

## 2012-06-21 LAB — CBC WITH DIFFERENTIAL/PLATELET
Basophils Absolute: 0 10*3/uL (ref 0.0–0.1)
Basophils Relative: 0 % (ref 0–1)
MCHC: 33.7 g/dL (ref 30.0–36.0)
Monocytes Absolute: 0.5 10*3/uL (ref 0.1–1.0)
Neutro Abs: 6.3 10*3/uL (ref 1.7–7.7)
Neutrophils Relative %: 72 % (ref 43–77)
Platelets: 256 10*3/uL (ref 150–400)
RDW: 15.4 % (ref 11.5–15.5)

## 2012-06-21 LAB — URINE MICROSCOPIC-ADD ON

## 2012-06-21 MED ORDER — HYDROMORPHONE HCL PF 1 MG/ML IJ SOLN
1.0000 mg | Freq: Once | INTRAMUSCULAR | Status: AC
  Administered 2012-06-21: 1 mg via INTRAVENOUS
  Filled 2012-06-21: qty 1

## 2012-06-21 MED ORDER — ONDANSETRON HCL 4 MG/2ML IJ SOLN
4.0000 mg | Freq: Once | INTRAMUSCULAR | Status: AC
  Administered 2012-06-21: 4 mg via INTRAVENOUS
  Filled 2012-06-21: qty 2

## 2012-06-21 MED ORDER — PROMETHAZINE HCL 25 MG PO TABS: 25.0000 mg | ORAL_TABLET | Freq: Four times a day (QID) | ORAL | Status: DC | PRN

## 2012-06-21 MED ORDER — IOHEXOL 300 MG/ML  SOLN
50.0000 mL | Freq: Once | INTRAMUSCULAR | Status: AC | PRN
  Administered 2012-06-21: 50 mL via ORAL

## 2012-06-21 MED ORDER — OXYCODONE-ACETAMINOPHEN 5-325 MG PO TABS: 1.0000 | ORAL_TABLET | Freq: Four times a day (QID) | ORAL | Status: DC | PRN

## 2012-06-21 MED ORDER — IOHEXOL 300 MG/ML  SOLN
100.0000 mL | Freq: Once | INTRAMUSCULAR | Status: AC | PRN
  Administered 2012-06-21: 100 mL via INTRAVENOUS

## 2012-06-21 MED ORDER — CIPROFLOXACIN HCL 500 MG PO TABS: 500.0000 mg | ORAL_TABLET | Freq: Two times a day (BID) | ORAL | Status: DC

## 2012-06-21 NOTE — ED Notes (Signed)
Pt alert & oriented x4, stable gait. Patient given discharge instructions, paperwork & prescription(s). Patient  instructed to stop at the registration desk to finish any additional paperwork. Patient verbalized understanding. Pt left department w/ no further questions. 

## 2012-06-21 NOTE — ED Notes (Signed)
Patient states he is still in pain. RN made aware.

## 2012-06-21 NOTE — ED Provider Notes (Signed)
History    This chart was scribed for Edwin Lennert, MD by [name], ED Scribe. The patient was seen in room APA10/APA10. Patient's care was started at 3:05 PM.    CSN: [id number]  Arrival date & time 06/21/12  1408   First MD Initiated Contact with Patient 06/21/12 1505      Chief Complaint  Patient presents with  . Nausea    (Consider location/radiation/quality/duration/timing/severity/associated sxs/prior treatment) Patient is a 57 y.o. male presenting with abdominal pain. The history is provided by the patient. No language interpreter was used.  Abdominal Pain Pain location:  LLQ Timing:  Constant Progression:  Worsening Chronicity:  Recurrent Worsened by:  Movement, bowel movements and position changes Associated symptoms: nausea   Associated symptoms: no chest pain, no cough, no diarrhea, no fatigue and no hematuria   Risk factors: recent hospitalization    [name] is a 57 y.o. male with h/o heart failure and HTN who presents to the Emergency Department complaining of moderate constant left side abdominal  pain onset 2 days ago. Pt states that he has associated nausea and lower back pain accompanied with trouble having bowel movements. He states the pain is so persistent that he has trouble getting in and out of his wheel chair. Pt also has had a lot of UTI's. Pt denies fever, chills, cough, vomiting, diarrhea, SOB, weakness, and any other associated symptoms. Pt's Urologist is Dr. Suan Halter which he has a scheduled upcoming appoint for CT scans and an ultra sound. Pt states he was prescribed antibiotics for his last visit but currently is not taking them. Pt has medical hx of bladder cancer and diverticulitis.   Past Medical History  Diagnosis Date  . Gross hematuria   . C. difficile enteritis 07/04/2011  . Diverticulitis   . Subcutaneous emphysema, postoperative 01/07/2012  . Cirrhosis 01/07/2012    Radiographically  . Splenomegaly   . 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 Acute pyelonephritis 01/07/2012    Past Surgical History  Procedure Laterality Date  . Elbow surgery    . Transurethral resection of bladder tumor  10/26/2011    Procedure: TRANSURETHRAL RESECTION OF BLADDER TUMOR (TURBT);  Surgeon: Ky Barban, MD;  Location: [facility];  Service: Urology;  Laterality: N/A;  . Transurethral resection of prostate  10/26/2011    Procedure: TRANSURETHRAL RESECTION OF THE PROSTATE (TURP);  Surgeon: Ky Barban, MD;  Location: [facility];  Service: Urology;  Laterality: N/A;    Family History  Problem Relation Age of Onset  . Heart failure Father   . Hypertension Mother     History  Substance Use Topics  . Smoking status: Former Smoker    Quit date: 12/03/2007  . Smokeless tobacco: Not on file  . Alcohol Use: No      Review of Systems  Constitutional: Negative for fatigue.  HENT: Negative for congestion, sinus pressure and ear discharge.   Eyes: Negative for discharge.  Respiratory: Negative for cough.   Cardiovascular: Negative for chest pain.  Gastrointestinal: Positive for nausea and abdominal pain. Negative for diarrhea.  Genitourinary: Negative for frequency and hematuria.  Musculoskeletal: Positive for back pain.  Skin: Negative for rash.  Neurological: Negative for seizures and headaches.  Psychiatric/Behavioral: Negative for hallucinations.    Allergies  Review of patient's allergies indicates no known allergies.  Home Medications  Current Outpatient Rx  Name  Route  Sig  Dispense  Refill  . ALPRAZolam (XANAX) 0.5 MG tablet   Oral   Take 0.5 mg by mouth 2 (two) times daily as needed. Panic symptoms         . ciprofloxacin (CIPRO) 500 MG tablet   Oral   Take 1 tablet (500 mg total) by mouth every 12 (twelve) hours.   20 tablet   0   . docusate sodium (COLACE) 100 MG capsule    Oral   Take 100 mg by mouth daily.         . metroNIDAZOLE (FLAGYL) 500 MG tablet   Oral   Take 1 tablet (500 mg total) by mouth 2 (two) times daily.   20 tablet   0   . mirtazapine (REMERON) 15 MG tablet   Oral   Take 15 mg by mouth at bedtime.         . naproxen (NAPROSYN) 500 MG tablet   Oral   Take 1 tablet (500 mg total) by mouth 2 (two) times daily with a meal.   30 tablet   0   . oxyCODONE-acetaminophen (PERCOCET) 5-325 MG per tablet   Oral   Take 1 tablet by mouth every 4 (four) hours as needed for pain.   20 tablet   0     BP 155/86  Pulse 82  Temp(Src) 97.7 F (36.5 C) (Oral)  Resp 20  Ht 5\' 8"  (1.727 m)  Wt 231 lb (104.781 kg)  BMI 35.13 kg/m2  SpO2 97%  Physical Exam  Nursing note and vitals reviewed. Constitutional: He is oriented to person, place, and time. He appears well-developed.  HENT:  Head: Normocephalic.  Eyes: Conjunctivae and EOM are normal. No scleral icterus.  Neck: Neck supple. No thyromegaly present.  Cardiovascular: Normal rate and regular rhythm.  Exam reveals no gallop and no friction rub.   No murmur heard. Pulmonary/Chest: No stridor. He has no wheezes. He has no rales. He exhibits no tenderness.  Abdominal: He exhibits no distension. There is tenderness. There is no rebound.  Moderate LLQ tenderness with no rebound. Mild lower l-spine tenderness.  Musculoskeletal: Normal range of motion. He exhibits no edema.  Lymphadenopathy:    He has no cervical adenopathy.  Neurological: He is oriented to person, place, and time. Coordination normal.  Skin: No rash noted. No erythema.  Psychiatric: He has a normal mood and affect. His behavior is normal.    ED Course  Procedures (including critical care time) DIAGNOSTIC STUDIES: Oxygen Saturation is 97% on room air, adequate by my interpretation.    COORDINATION OF CARE: 3:21 PM Discussed ED treatment with pt and pt agrees.  6:35 PM Discussed lab results with pt and pt agrees.       Labs Reviewed  URINALYSIS, ROUTINE W REFLEX MICROSCOPIC - Abnormal; Notable for the following:    Hgb urine dipstick SMALL (*)    All other components within normal limits  COMPREHENSIVE METABOLIC PANEL - Abnormal; Notable for the following:    GFR calc non Af Amer 79 (*)    All other components within normal limits  URINE MICROSCOPIC-ADD ON - Abnormal; Notable for the following:    Squamous Epithelial / LPF FEW (*)    All other components within normal limits  URINE CULTURE  CBC WITH DIFFERENTIAL   Ct Abdomen Pelvis W Contrast  06/21/2012  *RADIOLOGY REPORT*  Clinical Data: Pain.  History of bladder cancer.  CT ABDOMEN AND  PELVIS WITH CONTRAST  Technique:  Multidetector CT imaging of the abdomen and pelvis was performed following the standard protocol during bolus administration of intravenous contrast.  Contrast: OMNIPAQUE IOHEXOL 300 MG/ML  SOLN, 50mL OMNIPAQUE IOHEXOL 300 MG/ML  SOLN  Comparison: 05/28/2012  Findings: The pulmonary nodule in the right middle lobe measures 4 mm, image 7/series 4.  This is new since the previous examination from 01/07/2012.  3 mm nodule in the right middle lobe is stable from previous exam, image 7/series 4.  There is a 4 ml nodule in the left lower lobe which is increased from 3 mm previously. Within the right lower lobe there is a 5 mm nodule, image 15/series 4.  This is new since 01/07/2012.  There are no focal liver abnormalities identified.  The gallbladder appears within normal limits.  There is no biliary dilatation. Normal appearance of the pancreas. The spleen is unremarkable.  The adrenal glands both appear normal.  Kidneys are both normal. No evidence for hydronephrosis.  Postsurgical change from cystectomy with ileal conduit is identified.  The ureters are patent.  Abnormal soft tissue infiltration within the retroperitoneal fat is again noted.  Multiple enlarged periaortic lymph nodes are identified.  Index lymph node measures 1.2 cm,  image 54/series 3. This is unchanged from previous exam.  Index aorto caval lymph node measures 1.2 cm, image 57/series 3.  Previously this measured 1.2 cm. The soft tissue stranding extends into the pelvis along the left iliac lymph node chain and into the left inguinal region. Index left common iliac lymph node measures 1.30 cm, image number 63/series 3.  Previously 0.8 cm.  Enlarged external iliac lymph node measures 1.7 cm, image 76.  This is compared with the same previously. Within the left inguinal region there is a 1.3 cm lymph node, image 95/series 3.  Previously this measured 1.5 cm.  No significant free fluid or abnormal fluid collections identified within the abdomen or pelvis.  The stomach appears normal.  The small bowel loops have a normal caliber.  The colon has a normal course and caliber.  Multiple distal colonic diverticula are identified.  There is been interval improvement in previously noted sigmoid diverticulitis.  Review of the visualized osseous structures is significant for advanced lumbar spondylosis. There is a small umbilical hernia which contains fat and a small amount of fluid.  IMPRESSION:  1.  No acute findings within the abdomen wall or pelvis. 2.  Similar appearance of soft tissue stranding and retroperitoneal adenopathy within the upper abdomen.  There is also a similar appearance of soft tissue infiltration along the left iliac lymph node chain and associated iliac and left inguinal adenopathy. 3.  Patent bilateral ureters status post cystectomy with ileal conduit. 4.  Small umbilical hernia contains fat and a small amount of fluid. 5.  Pulmonary nodules in the right middle lobe and right base. These are new when compared with chest CT from 01/07/2012.  The right lower lobe nodule is stable from the most recent previous abdomen and pelvis CT from 05/28/2012.  Right middle lobe nodule was not imaged on previous exam.   Original Report Authenticated By: Signa Kell, M.D.       No diagnosis found.    MDM  The chart was scribed for me under my direct supervision.  I personally performed the history, physical, and medical decision making and all procedures in the evaluation of this patient.Edwin Lennert, MD  06/21/12 1842 

## 2012-06-21 NOTE — ED Notes (Signed)
Pt reports low back pain and left side ab pain for 3 days, denies any known injury. +nauesa. No vomiting or diarrhea, unsure of last bm.

## 2012-06-21 NOTE — ED Notes (Signed)
Complain of back pain and nausea

## 2012-06-21 NOTE — ED Notes (Signed)
Called to ct scan, pt's Iv had infiltrated while contrast was pushed.  Per tammy in ct.

## 2012-06-21 NOTE — ED Notes (Signed)
Pt reports cont. Pain and nausea, md notified. meds ordered and given.

## 2012-06-23 LAB — URINE CULTURE

## 2012-06-24 ENCOUNTER — Telehealth (HOSPITAL_COMMUNITY): Payer: Self-pay | Admitting: Emergency Medicine

## 2012-06-24 NOTE — ED Notes (Signed)
Patient has +Urine culture. °

## 2012-06-24 NOTE — ED Notes (Signed)
+  Urine. Patient given Cipro. Resistant. Chart sent to EDP office for review.

## 2012-06-25 ENCOUNTER — Telehealth (HOSPITAL_COMMUNITY): Payer: Self-pay | Admitting: Emergency Medicine

## 2012-06-25 NOTE — ED Notes (Signed)
Chart returned from EDP office. Per Roxy Horseman PA-C, stop Cipro. Start Keflex 500 mg QID x 7 days. #28.

## 2012-07-09 ENCOUNTER — Emergency Department (HOSPITAL_COMMUNITY): Payer: Medicaid Other

## 2012-07-09 ENCOUNTER — Emergency Department (HOSPITAL_COMMUNITY)
Admission: EM | Admit: 2012-07-09 | Discharge: 2012-07-10 | Disposition: A | Discharge status: Home / Self Care | Payer: Medicaid Other | Attending: Emergency Medicine | Admitting: Emergency Medicine

## 2012-07-09 ENCOUNTER — Encounter (HOSPITAL_COMMUNITY): Payer: Self-pay | Admitting: Emergency Medicine

## 2012-07-09 DIAGNOSIS — Z87891 Personal history of nicotine dependence: Secondary | ICD-10-CM | POA: Insufficient documentation

## 2012-07-09 DIAGNOSIS — Z8719 Personal history of other diseases of the digestive system: Secondary | ICD-10-CM | POA: Insufficient documentation

## 2012-07-09 DIAGNOSIS — K5900 Constipation, unspecified: Secondary | ICD-10-CM | POA: Insufficient documentation

## 2012-07-09 DIAGNOSIS — Z87448 Personal history of other diseases of urinary system: Secondary | ICD-10-CM | POA: Insufficient documentation

## 2012-07-09 DIAGNOSIS — R110 Nausea: Secondary | ICD-10-CM | POA: Insufficient documentation

## 2012-07-09 DIAGNOSIS — Z8709 Personal history of other diseases of the respiratory system: Secondary | ICD-10-CM | POA: Insufficient documentation

## 2012-07-09 DIAGNOSIS — M545 Low back pain, unspecified: Secondary | ICD-10-CM | POA: Insufficient documentation

## 2012-07-09 DIAGNOSIS — Z87440 Personal history of urinary (tract) infections: Secondary | ICD-10-CM | POA: Insufficient documentation

## 2012-07-09 DIAGNOSIS — Z8619 Personal history of other infectious and parasitic diseases: Secondary | ICD-10-CM | POA: Insufficient documentation

## 2012-07-09 DIAGNOSIS — Z8551 Personal history of malignant neoplasm of bladder: Secondary | ICD-10-CM | POA: Insufficient documentation

## 2012-07-09 DIAGNOSIS — Z79899 Other long term (current) drug therapy: Secondary | ICD-10-CM | POA: Insufficient documentation

## 2012-07-09 DIAGNOSIS — M5430 Sciatica, unspecified side: Secondary | ICD-10-CM | POA: Insufficient documentation

## 2012-07-09 DIAGNOSIS — M5432 Sciatica, left side: Secondary | ICD-10-CM

## 2012-07-09 DIAGNOSIS — R109 Unspecified abdominal pain: Secondary | ICD-10-CM | POA: Insufficient documentation

## 2012-07-09 LAB — CBC WITH DIFFERENTIAL/PLATELET
Eosinophils Relative: 0 % (ref 0–5)
Hemoglobin: 14.6 g/dL (ref 13.0–17.0)
Lymphocytes Relative: 15 % (ref 12–46)
Lymphs Abs: 1.9 10*3/uL (ref 0.7–4.0)
MCV: 79.6 fL (ref 78.0–100.0)
Platelets: 355 10*3/uL (ref 150–400)
RBC: 5.4 MIL/uL (ref 4.22–5.81)
WBC: 12.7 10*3/uL — ABNORMAL HIGH (ref 4.0–10.5)

## 2012-07-09 LAB — BASIC METABOLIC PANEL
CO2: 25 mEq/L (ref 19–32)
Calcium: 9.3 mg/dL (ref 8.4–10.5)
Glucose, Bld: 130 mg/dL — ABNORMAL HIGH (ref 70–99)
Potassium: 3.9 mEq/L (ref 3.5–5.1)
Sodium: 140 mEq/L (ref 135–145)

## 2012-07-09 LAB — URINE MICROSCOPIC-ADD ON

## 2012-07-09 LAB — URINALYSIS, ROUTINE W REFLEX MICROSCOPIC
Glucose, UA: NEGATIVE mg/dL
Specific Gravity, Urine: 1.015 (ref 1.005–1.030)

## 2012-07-09 MED ORDER — IOHEXOL 300 MG/ML  SOLN
100.0000 mL | Freq: Once | INTRAMUSCULAR | Status: AC | PRN
  Administered 2012-07-09: 100 mL via INTRAVENOUS

## 2012-07-09 MED ORDER — ONDANSETRON HCL 4 MG/2ML IJ SOLN
4.0000 mg | Freq: Once | INTRAMUSCULAR | Status: AC
  Administered 2012-07-09: 4 mg via INTRAVENOUS
  Filled 2012-07-09: qty 2

## 2012-07-09 MED ORDER — IOHEXOL 300 MG/ML  SOLN
50.0000 mL | Freq: Once | INTRAMUSCULAR | Status: AC | PRN
  Administered 2012-07-09: 50 mL via ORAL

## 2012-07-09 NOTE — ED Notes (Signed)
Patient presents to ER via [facility] with c/o lower back pain and lower abdominal pain.

## 2012-07-09 NOTE — ED Notes (Signed)
Pt c/o lower back pain and lower abdominal pain. Pt has urostomy in place.

## 2012-07-09 NOTE — ED Provider Notes (Signed)
History    This chart was scribed for Gilda Crease, MD, by Frederik Pear, ED scribe. The patient was seen in room APA07/APA07 and the patient's care was started at 2131.    CSN: [id number]  Arrival date & time 07/09/12  2121   First MD Initiated Contact with Patient 07/09/12 2131      Chief Complaint  Patient presents with  . Back Pain  . Abdominal Pain    (Consider location/radiation/quality/duration/timing/severity/associated sxs/prior treatment) The history is provided by the patient, the spouse and medical records. No language interpreter was used.   [name] is a 57 y.o. male brought in by ambulance with a h/o of transitional cell carcinoma and chronic UTIs and a surgical h/o of a transurethral resection of prostate and bladder tumor who presents to the Emergency Department complaining of sudden onset, constant right-sided SI joint pain that began without injury or trauma 4 days ago when he awoke. He also complains of constipation, but mucus in his stools when he finally has a BM. He also complains nausea after he eats. He denies emesis or cold-like symptoms. He states that he started a course of prednisone yesterday and that he recently had a Lipogram and US performed at [facility] a few weeks ago.  Past Medical History  Diagnosis Date  . Gross hematuria   . C. difficile enteritis 07/04/2011  . Diverticulitis   . Subcutaneous emphysema, postoperative 01/07/2012  . Cirrhosis 01/07/2012    Radiographically  . Splenomegaly   . Bladder cancer 01/07/2012    Transitional Cell Carcinoma, s/p bladder, prostate and lymph node resection with ileal conduit at [facility] on 10/9, discharged on 10/14   . Acute pyelonephritis 01/07/2012    Past Surgical History  Procedure Laterality Date  . Elbow surgery    . Transurethral resection of bladder tumor  10/26/2011    Procedure: TRANSURETHRAL RESECTION OF BLADDER TUMOR (TURBT);  Surgeon: Ky Barban, MD;  Location: [facility];   Service: Urology;  Laterality: N/A;  . Transurethral resection of prostate  10/26/2011    Procedure: TRANSURETHRAL RESECTION OF THE PROSTATE (TURP);  Surgeon: Ky Barban, MD;  Location: [facility];  Service: Urology;  Laterality: N/A;    Family History  Problem Relation Age of Onset  . Heart failure Father   . Hypertension Mother     History  Substance Use Topics  . Smoking status: Former Smoker    Quit date: 12/03/2007  . Smokeless tobacco: Not on file  . Alcohol Use: No      Review of Systems  Constitutional: Negative for fever.  HENT: Negative for congestion, sore throat and rhinorrhea.   Respiratory: Negative for cough and shortness of breath.   Cardiovascular: Negative for chest pain.  Gastrointestinal: Positive for nausea and constipation. Negative for vomiting.  Genitourinary: Negative for difficulty urinating, penile pain and testicular pain.  Musculoskeletal: Positive for back pain.  All other systems reviewed and are negative.    Allergies  Review of patient's allergies indicates no known allergies.  Home Medications   Current Outpatient Rx  Name  Route  Sig  Dispense  Refill  . ALPRAZolam (XANAX) 0.5 MG tablet   Oral   Take 0.5 mg by mouth 2 (two) times daily as needed. Panic symptoms         . ciprofloxacin (CIPRO) 500 MG tablet   Oral   Take 1 tablet (500 mg total) by mouth 2 (two) times daily.   20 tablet   0   .  docusate sodium (COLACE) 100 MG capsule   Oral   Take 100 mg by mouth daily as needed for constipation.          . mirtazapine (REMERON) 15 MG tablet   Oral   Take 15 mg by mouth at bedtime.         Marland Kitchen oxyCODONE-acetaminophen (PERCOCET/ROXICET) 5-325 MG per tablet   Oral   Take 1 tablet by mouth every 6 (six) hours as needed for pain.   20 tablet   0   . promethazine (PHENERGAN) 25 MG tablet   Oral   Take 1 tablet (25 mg total) by mouth every 6 (six) hours as needed for nausea.   15 tablet   0     BP 154/83  Pulse  79  Temp(Src) 97.8 F (36.6 C) (Oral)  Resp 18  Ht 5\' 11"  (1.803 m)  Wt 252 lb (114.306 kg)  BMI 35.16 kg/m2  SpO2 96%  Physical Exam  Nursing note and vitals reviewed. Constitutional: He is oriented to person, place, and time. He appears well-developed and well-nourished. No distress.  HENT:  Head: Normocephalic and atraumatic.  Right Ear: Hearing normal.  Nose: Nose normal.  Mouth/Throat: Oropharynx is clear and moist and mucous membranes are normal.  Eyes: Conjunctivae and EOM are normal. Pupils are equal, round, and reactive to light.  Neck: Normal range of motion. Neck supple.  Cardiovascular: Normal rate, regular rhythm, S1 normal and S2 normal.  Exam reveals no gallop and no friction rub.   No murmur heard. Pulmonary/Chest: Effort normal and breath sounds normal. No respiratory distress. He exhibits no tenderness.  Abdominal: Soft. Normal appearance and bowel sounds are normal. There is no hepatosplenomegaly. There is no tenderness. There is no rebound, no guarding, no tenderness at McBurney's point and negative Murphy's sign. No hernia.  Musculoskeletal: Normal range of motion.  Tender over the right SI joint.  Neurological: He is alert and oriented to person, place, and time. He has normal strength. No cranial nerve deficit or sensory deficit. Coordination normal. GCS eye subscore is 4. GCS verbal subscore is 5. GCS motor subscore is 6.  Skin: Skin is warm, dry and intact. No rash noted. No cyanosis.  Psychiatric: He has a normal mood and affect. His speech is normal and behavior is normal. Thought content normal.    ED Course  Procedures (including critical care time)  DIAGNOSTIC STUDIES: Oxygen Saturation is 96% on room air, adequate by my interpretation.    COORDINATION OF CARE:  21:39- Discussed planned course of treatment with the patient, including an abdomen pelvic CT, blood work, and UA, who is agreeable at this time.  Results for orders placed during the  hospital encounter of 07/09/12  CBC WITH DIFFERENTIAL      Result Value Range   WBC 12.7 (*) 4.0 - 10.5 K/uL   RBC 5.40  4.22 - 5.81 MIL/uL   Hemoglobin 14.6  13.0 - 17.0 g/dL   HCT 08.6  57.8 - 46.9 %   MCV 79.6  78.0 - 100.0 fL   MCH 27.0  26.0 - 34.0 pg   MCHC 34.0  30.0 - 36.0 g/dL   RDW 62.9  52.8 - 41.3 %   Platelets 355  150 - 400 K/uL   Neutrophils Relative 79 (*) 43 - 77 %   Neutro Abs 10.0 (*) 1.7 - 7.7 K/uL   Lymphocytes Relative 15  12 - 46 %   Lymphs Abs 1.9  0.7 - 4.0 K/uL  Monocytes Relative 6  3 - 12 %   Monocytes Absolute 0.7  0.1 - 1.0 K/uL   Eosinophils Relative 0  0 - 5 %   Eosinophils Absolute 0.0  0.0 - 0.7 K/uL   Basophils Relative 0  0 - 1 %   Basophils Absolute 0.0  0.0 - 0.1 K/uL  BASIC METABOLIC PANEL      Result Value Range   Sodium 140  135 - 145 mEq/L   Potassium 3.9  3.5 - 5.1 mEq/L   Chloride 105  96 - 112 mEq/L   CO2 25  19 - 32 mEq/L   Glucose, Bld 130 (*) 70 - 99 mg/dL   BUN 16  6 - 23 mg/dL   Creatinine, Ser 1.61  0.50 - 1.35 mg/dL   Calcium 9.3  8.4 - 09.6 mg/dL   GFR calc non Af Amer >90  >90 mL/min   GFR calc Af Amer >90  >90 mL/min  URINALYSIS, ROUTINE W REFLEX MICROSCOPIC      Result Value Range   Color, Urine YELLOW  YELLOW   APPearance CLEAR  CLEAR   Specific Gravity, Urine 1.015  1.005 - 1.030   pH 6.5  5.0 - 8.0   Glucose, UA NEGATIVE  NEGATIVE mg/dL   Hgb urine dipstick TRACE (*) NEGATIVE   Bilirubin Urine NEGATIVE  NEGATIVE   Ketones, ur NEGATIVE  NEGATIVE mg/dL   Protein, ur NEGATIVE  NEGATIVE mg/dL   Urobilinogen, UA 0.2  0.0 - 1.0 mg/dL   Nitrite POSITIVE (*) NEGATIVE   Leukocytes, UA NEGATIVE  NEGATIVE  URINE MICROSCOPIC-ADD ON      Result Value Range   WBC, UA 11-20  <3 WBC/hpf   RBC / HPF 7-10  <3 RBC/hpf   Bacteria, UA MANY (*) RARE   Labs Reviewed  CBC WITH DIFFERENTIAL - Abnormal; Notable for the following:    WBC 12.7 (*)    Neutrophils Relative 79 (*)    Neutro Abs 10.0 (*)    All other components  within normal limits  BASIC METABOLIC PANEL - Abnormal; Notable for the following:    Glucose, Bld 130 (*)    All other components within normal limits  URINALYSIS, ROUTINE W REFLEX MICROSCOPIC - Abnormal; Notable for the following:    Hgb urine dipstick TRACE (*)    Nitrite POSITIVE (*)    All other components within normal limits  URINE MICROSCOPIC-ADD ON - Abnormal; Notable for the following:    Bacteria, UA MANY (*)    All other components within normal limits  URINE CULTURE   Ct Abdomen Pelvis W Contrast  07/10/2012  *RADIOLOGY REPORT*  Clinical Data: The abdominal pain.  History of multiple hernias. Gross hematuria.  Diverticulitis.  Cirrhosis.  Splenomegaly. Bladder cancer.  CT ABDOMEN AND PELVIS WITH CONTRAST  Technique:  Multidetector CT imaging of the abdomen and pelvis was performed following the standard protocol during bolus administration of intravenous contrast.  Contrast: OMNIPAQUE IOHEXOL 300 MG/ML  SOLN, 50mL OMNIPAQUE IOHEXOL 300 MG/ML  SOLN  Comparison: 06/21/2012.  Findings: The lung bases demonstrate multiple bilateral pulmonary nodules measuring from 5-7 mm in diameter.  These appear stable since the previous study.  The liver demonstrates a nodular contour with prominence of the lateral segment left lobe and caudate lobe consistent with cirrhosis.  Date low attenuation lesion in the right bell's lobe of the liver measuring 7 mm diameter.  It is too small to characterize but appears to been present previously.  Spleen size is mildly increased.  Flow is demonstrated in the portal and mesenteric vessels.  The gallbladder, pancreas, adrenal glands, abdominal aorta, and inferior vena cava are unremarkable.  There is evidence of lymphadenopathy in the periaortic retroperitoneal regions, appearing stable since previous study.  Metastatic disease is not excluded.  No significant mesenteric lymphadenopathy.  No hydronephrosis or solid mass in the kidneys.  The stomach, small bowel,  and colon are decompressed without evidence of obstruction. There is a right lower quadrant urinary conduit with peristomal hernia is suggested.  This is stable since the previous study. Umbilical and supraumbilical fat-containing abdominal wall hernias are also stable.  No free air or free fluid in the abdomen.  Pelvis:  Surgical absence of the bladder with right lower quadrant urinary conduit.  Appendix is normal.  Diverticula in the sigmoid colon without diverticulitis.  No free or loculated pelvic fluid collections.  Pelvic lymphadenopathy in the internal iliac chains bilaterally with left groin lymphadenopathy.  These findings appear stable since the previous study.  Metastatic disease is likely. Mild degenerative changes in the lumbar spine with normal alignment.  No destructive bone lesions are appreciated.  IMPRESSION: Stable appearance of the abdomen and pelvis since previous study. Again demonstrated are multiple bilateral pulmonary nodules, retroperitoneal lymphadenopathy, pelvic and left groin lymphadenopathy suspicious for metastatic disease.  Postoperative changes with cystectomy and right lower quadrant ileal conduit. Peristomal and midline abdominal wall hernias are stable. Cirrhosis of the liver with mild splenic enlargement.   Original Report Authenticated By: Burman Nieves, M.D.      Diagnoses: 1. Sciatica 2. Abdominal pain    MDM  Patient presents to the ER for evaluation of pain in his lower back which is consistent with sciatica. He was prescribed prednisone by his primary Dr., but stopped it because it caused him to have some nausea. Patient is concerned that he has a hernia around his urostomy stoma. Abdominal exam was fairly benign. Because of his concern and his surgical history, however, CT scan was obtained. No abnormalities seen. Patient was reassured, Take the hydrocodone and followup with primary physician.  I personally performed the services described in this  documentation, which was scribed in my presence. The recorded information has been reviewed and is accurate.       Gilda Crease, MD 07/12/12 [phone]

## 2012-07-10 MED ORDER — ONDANSETRON HCL 4 MG/2ML IJ SOLN
4.0000 mg | Freq: Once | INTRAMUSCULAR | Status: AC
  Administered 2012-07-10: 4 mg via INTRAVENOUS
  Filled 2012-07-10: qty 2

## 2012-07-10 MED ORDER — MORPHINE SULFATE 4 MG/ML IJ SOLN
4.0000 mg | Freq: Once | INTRAMUSCULAR | Status: AC
  Administered 2012-07-10: 4 mg via INTRAVENOUS
  Filled 2012-07-10: qty 1

## 2012-07-11 LAB — URINE CULTURE

## 2012-08-03 ENCOUNTER — Emergency Department (HOSPITAL_COMMUNITY)
Admission: EM | Admit: 2012-08-03 | Discharge: 2012-08-03 | Disposition: A | Discharge status: Home / Self Care | Payer: Medicaid Other | Attending: Emergency Medicine | Admitting: Emergency Medicine

## 2012-08-03 ENCOUNTER — Encounter (HOSPITAL_COMMUNITY): Payer: Self-pay | Admitting: *Deleted

## 2012-08-03 DIAGNOSIS — Z87891 Personal history of nicotine dependence: Secondary | ICD-10-CM | POA: Insufficient documentation

## 2012-08-03 DIAGNOSIS — M549 Dorsalgia, unspecified: Secondary | ICD-10-CM

## 2012-08-03 DIAGNOSIS — Z87448 Personal history of other diseases of urinary system: Secondary | ICD-10-CM | POA: Insufficient documentation

## 2012-08-03 DIAGNOSIS — Z8551 Personal history of malignant neoplasm of bladder: Secondary | ICD-10-CM | POA: Insufficient documentation

## 2012-08-03 DIAGNOSIS — Z8546 Personal history of malignant neoplasm of prostate: Secondary | ICD-10-CM | POA: Insufficient documentation

## 2012-08-03 DIAGNOSIS — Z8719 Personal history of other diseases of the digestive system: Secondary | ICD-10-CM | POA: Insufficient documentation

## 2012-08-03 DIAGNOSIS — Z8709 Personal history of other diseases of the respiratory system: Secondary | ICD-10-CM | POA: Insufficient documentation

## 2012-08-03 DIAGNOSIS — Z79899 Other long term (current) drug therapy: Secondary | ICD-10-CM | POA: Insufficient documentation

## 2012-08-03 MED ORDER — OXYCODONE-ACETAMINOPHEN 5-325 MG PO TABS
2.0000 | ORAL_TABLET | Freq: Once | ORAL | Status: AC
  Administered 2012-08-03: 2 via ORAL
  Filled 2012-08-03: qty 2

## 2012-08-03 MED ORDER — OXYCODONE-ACETAMINOPHEN 5-325 MG PO TABS: 1.0000 | ORAL_TABLET | ORAL | Status: DC | PRN

## 2012-08-03 NOTE — ED Notes (Signed)
Patient states that he has been being treated by his PCP for weeks to help the pain.   Patient advised has been to [facility] and had CT of hip/leg.   Patient states they believe the cancer has metastasized.   Patient claims that he needs and MRI and bone density exam today.   Patient states that he is supposed to have an appointment with oncology in two weeks for same, but that he couldn't wait because of pain.

## 2012-08-03 NOTE — ED Provider Notes (Signed)
Medical screening examination/treatment/procedure(s) were performed by non-physician practitioner and as supervising physician I was immediately available for consultation/collaboration.   Eman Rynders, MD 08/03/12 2216 

## 2012-08-03 NOTE — ED Provider Notes (Signed)
History     CSN: [id number]  Arrival date & time 08/03/12  1147   First MD Initiated Contact with Patient 08/03/12 1159      Chief Complaint  Patient presents with  . Back Pain    (Consider location/radiation/quality/duration/timing/severity/associated sxs/prior treatment) HPI Comments: Patient with h/o bladder cancer, prostate cancer presents with back pain for the past month. Patient had a CT scan done at the end of 06/2012 that showed suspicious lymphadenopathy. There is concern for cancer that has metastasized to bone. Patient was told by his primary care physician to come to the emergency department today for uncontrolled pain and to see an oncologist. Patient has been taking hydrocodone as directed but this has not been helping. Patient states pain is worse at night. He denies weakness, numbness, tingling in his legs. Patient states that the pain moves around. No urinary retention or fecal incontinence. Onset of symptoms gradual. Course is gradually worsening. Nothing makes symptoms better or worse.  Patient is a 57 y.o. male presenting with back pain. The history is provided by the patient, medical records and the spouse.  Back Pain Associated symptoms: no fever, no numbness and no weakness     Past Medical History  Diagnosis Date  . Gross hematuria   . C. difficile enteritis 07/04/2011  . Diverticulitis   . Subcutaneous emphysema, postoperative 01/07/2012  . Cirrhosis 01/07/2012    Radiographically  . Splenomegaly   . Bladder cancer 01/07/2012    Transitional Cell Carcinoma, s/p bladder, prostate and lymph node resection with ileal conduit at [facility] on 10/9, discharged on 10/14   . Acute pyelonephritis 01/07/2012    Past Surgical History  Procedure Laterality Date  . Elbow surgery    . Transurethral resection of bladder tumor  10/26/2011    Procedure: TRANSURETHRAL RESECTION OF BLADDER TUMOR (TURBT);  Surgeon: Ky Barban, MD;  Location: [facility];  Service: Urology;   Laterality: N/A;  . Transurethral resection of prostate  10/26/2011    Procedure: TRANSURETHRAL RESECTION OF THE PROSTATE (TURP);  Surgeon: Ky Barban, MD;  Location: [facility];  Service: Urology;  Laterality: N/A;    Family History  Problem Relation Age of Onset  . Heart failure Father   . Hypertension Mother     History  Substance Use Topics  . Smoking status: Former Smoker    Quit date: 12/03/2007  . Smokeless tobacco: Not on file  . Alcohol Use: No      Review of Systems  Constitutional: Negative for fever and unexpected weight change.  Gastrointestinal: Negative for constipation.       Neg for fecal incontinence  Genitourinary: Negative for hematuria, flank pain and difficulty urinating.       Negative for urinary incontinence or retention  Musculoskeletal: Positive for back pain.  Neurological: Negative for weakness and numbness.       Negative for saddle paresthesias     Allergies  Review of patient's allergies indicates no known allergies.  Home Medications   Current Outpatient Rx  Name  Route  Sig  Dispense  Refill  . clonazePAM (KLONOPIN) 0.5 MG tablet   Oral   Take 0.5 mg by mouth 3 (three) times daily as needed for anxiety.          . cyclobenzaprine (FLEXERIL) 10 MG tablet   Oral   Take 10 mg by mouth 3 (three) times daily as needed for muscle spasms.         Marland Kitchen HYDROcodone-acetaminophen (NORCO/VICODIN) 5-325 MG  per tablet   Oral   Take 1 tablet by mouth every 6 (six) hours as needed for pain.         . mirtazapine (REMERON) 15 MG tablet   Oral   Take 15 mg by mouth at bedtime.           BP 150/84  Pulse 110  Temp(Src) 97.4 F (36.3 C)  Resp 20  SpO2 98%  Physical Exam  Nursing note and vitals reviewed. Constitutional: He appears well-developed and well-nourished.  HENT:  Head: Normocephalic and atraumatic.  Eyes: Conjunctivae are normal.  Neck: Normal range of motion.  Abdominal: Soft. There is no tenderness. There is no  CVA tenderness.  Musculoskeletal: He exhibits no tenderness.       Right hip: Normal.       Left hip: Normal.       Right knee: Normal.       Left knee: Normal.       Thoracic back: Normal.       Lumbar back: He exhibits tenderness (Generalized). He exhibits normal range of motion.  No step-off noted with palpation of spine.   Neurological: He is alert. He has normal reflexes. No sensory deficit. He exhibits normal muscle tone.  5/5 strength in entire lower extremities bilaterally. No sensation deficit.   Skin: Skin is warm and dry.  Psychiatric: He has a normal mood and affect.    ED Course  Procedures (including critical care time)  Labs Reviewed - No data to display No results found.   1. Back pain     12:29 PM Patient seen and examined. Work-up initiated. Medications ordered.   Vital signs reviewed and are as follows: Filed Vitals:   08/03/12 1152  BP: 150/84  Pulse: 110  Temp: 97.4 F (36.3 C)  Resp: 20   12:42 PM D/w Dr. Anitra Lauth. Unfortunately patient will have to await completion of referral. No emergent indication to order MRI. Will treat pain.   Patient counseled on use of narcotic pain medications. Counseled not to combine these medications with others containing tylenol. Urged not to drink alcohol, drive, or perform any other activities that requires focus while taking these medications. The patient verbalizes understanding and agrees with the plan.  MDM  Patient with history of cancer with chronic back pain worsening over the past 4 weeks. There are features that are concerning for bony metastasis. Patient has been referred to an oncologist by his primary care physician. This is in progress. I have addressed pain control today and switch patient from Vicodin to Percocet. There are no other red flag signs and symptoms of lower back pain or indications for emergent MRI. Patient encouraged to followup with his primary care physician and obtain an oncology referral  as planned. Return instructions given.        Renne Crigler, PA-C 08/03/12 1539

## 2012-08-03 NOTE — Discharge Instructions (Signed)
Please read and follow all provided instructions.  Your diagnoses today include:  1. Back pain     Tests performed today include:  Vital signs - see below for your results today  Medications prescribed:   Percocet (oxycodone/acetaminophen) - narcotic pain medication  DO NOT drive or perform any activities that require you to be awake and alert because this medicine can make you drowsy. BE VERY CAREFUL not to take multiple medicines containing Tylenol (also called acetaminophen). Doing so can lead to an overdose which can damage your liver and cause liver failure and possibly death.  Take any prescribed medications only as directed.  Home care instructions:   Follow any educational materials contained in this packet  Please rest, use ice or heat on your back for the next several days  Do not lift, push, pull anything more than 10 pounds for the next week  Follow-up instructions: Please follow-up with your primary care provider in the next 1 week for further evaluation of your symptoms. If you do not have a primary care doctor -- see below for referral information.   Follow-up on referral being done by your primary doctor.   Return instructions:  SEEK IMMEDIATE MEDICAL ATTENTION IF YOU HAVE:  New numbness, tingling, weakness, or problem with the use of your arms or legs  Severe back pain not relieved with medications  Loss control of your bowels or bladder  Increasing pain in any areas of the body (such as chest or abdominal pain)  Shortness of breath, dizziness, or fainting.   Worsening nausea (feeling sick to your stomach), vomiting, fever, or sweats  Any other emergent concerns regarding your health   Additional Information:  Your vital signs today were: BP 150/84   Pulse 110   Temp(Src) 97.4 F (36.3 C)   Resp 20   SpO2 98% If your blood pressure (BP) was elevated above 135/85 this visit, please have this repeated by your doctor within one  month. -------------- RESOURCE GUIDE  Chronic Pain Problems:  [facility]:  [phone]  Patients need to be referred by their primary care doctor  Insufficient Money for Medicine:  [employer]:     call "211"  [facility]:  [phone]  No Primary Care Doctor:  To locate a primary care doctor that accepts your insurance or provides certain services:  [facility] :   [phone]  Physician Referral Service:  [phone]  Agencies that provide inexpensive medical care:  [facility]:   [phone]  [facility]:   [phone]  [facility]:   [phone]  Women's Clinic:     [phone]  Planned Parenthood:    [phone]  [facility]:     [phone]  Medicaid-accepting [facility] Providers:  [facility]  [address], [phone], Mon-Fri 9am-7pm, Sat 9am-1pm  [facility]  [address] [place], [address] [place], [phone]  [facility]  [address] [place], [phone]  [facility]  [address], [phone]  Renaye Rakers  [address], [phone]  Only accepts [place] [employer] patients after they have their name  applied to their card  Self Pay (no insurance) in [facility]:  Sickle Cell Patients: Dr. Willey Blade, [facility]  [address] [place], [phone]  [facility]  [address] [place], [phone]  [facility] [place]  [address], [facility] [address]  [phone], Mon-Fri 9am-7pm, Sat 9am-1pm  [facility]  [address] [place], [phone]  [facility]  [address], [phone]  [facility]  [address], [phone]  Dr. Julio Sicks  [address], [place], [phone]  [facility]  [address], [phone]  [facility]  [address] [phone]  [address], [phone]  [facility]  [address] [place], [phone], 1st & 3rd Saturday every month, 10am-1pm  Strategies for finding a Primary Care Provider:  1) Find a Doctor and Pay Out of Pocket Although you won't have to find out who is covered by your insurance plan, it is a good idea to ask around and get recommendations. You will then need to call the office and see if the doctor you have chosen will accept you as a new patient and what types of options they offer for patients who are self-pay. Some doctors offer discounts or will set up payment plans for their patients who do not have insurance, but you will need to ask so you aren't surprised when you get to your appointment.  2) Contact Your Local Health Department Not all health departments have doctors that can see patients for sick visits, but many do, so it is worth a call to see if yours does. If you don't know where your local health department is, you can check in your phone book. The CDC also has a tool to help you locate your state's health department, and many state websites also have listings of all of their local health departments.  3) Find a Walk-in Clinic If your illness is not likely to be very severe or complicated, you may want to try a walk in clinic. These are popping up all over the country in pharmacies, drugstores, and shopping centers. They're usually staffed by nurse practitioners or physician assistants that have been trained to treat common illnesses and complaints. They're usually fairly quick and inexpensive. However, if you have serious medical issues or chronic medical problems, these are probably not your best option  STD Testing:  [facility], [place] [facility]  [address] [place] [employer], phone [phone] or [phone]    Monday - Friday,  call for an appointment  [facility] Department of [facility], [place] [facility]  [address], [place], phone [phone] or [phone]   Monday - Friday, call for an appointment  Abuse/Neglect:  [facility] Child Abuse Hotline:  917-717-6777  [facility] Child Abuse Hotline: (306)500-3963 (After Hours)  Emergency Shelter:  [facility] [phone]  Maternity Homes:  [facility]: [phone]  [facility]: 9012287734  MRSA Hotline:   5160131157   [facility]  [address]  [phone]   [place]  [address] [place], [place]  [phone]   [facility]  [address], [place]  [phone]   [facility]  [phone]   [facility]  [phone]   [facility] [place]  [address]  [phone], [facility] Child Abuse Hotline  (310)754-5260  6141125116 (  After Hours)   Behavioral Health Services  Substance Abuse Resources:  Al[facility] and Drug Services:     939-878-6029  [facility]:   [phone]  [facility]:     [phone]  [facility]:      [phone]  Residential & Outpatient Substance Abuse Program: [phone]  Psychological Services:  [facility]:   [phone]  [facility] Services:    214-616-9198  [facility]  [address] [place]  ACCESS LINE: 240-435-2856 or [phone]  [employer]  Dental Assistance: If unable to pay or uninsured, contact:  [facility] or [facility]. to become qualified for the adult dental clinic.  Patients with Medicaid  [facility]  [phone] [address], [phone]  [address] [phone]  If unable to pay, or uninsured: contact [facility] [phone]) or [facility] Department [phone] in [place], [phone] in [facility]) to become qualified for the adult dental clinic  Other Low-Cost Community Dental Services:  [facility]  [address] [place], [place], [place], [address]  [phone], Ext. [phone]  2nd and 4th Thursday of the month at [facility]  [facility]  [address] [place], [place], [place], [address]  [phone]  [facility]  [address], [place], [place], [address]  [phone]  [facility] Health Department  [phone]  [facility] Health Department  [phone]  [facility] Health Department  [phone]

## 2012-08-03 NOTE — ED Notes (Signed)
Pt reports bilateral lower back pain that goes down bilateral legs.  States it is intermittent and is an achy pain.

## 2012-08-04 ENCOUNTER — Telehealth: Payer: Self-pay | Admitting: Hematology & Oncology

## 2012-08-04 NOTE — Telephone Encounter (Signed)
Dr. Myna Hidalgo has pt's chart to review

## 2012-08-06 ENCOUNTER — Emergency Department: Payer: Self-pay | Admitting: Emergency Medicine

## 2012-08-09 ENCOUNTER — Telehealth: Payer: Self-pay | Admitting: Hematology & Oncology

## 2012-08-09 NOTE — Telephone Encounter (Signed)
Pt aware of 5-30 appointment

## 2012-08-11 ENCOUNTER — Other Ambulatory Visit: Payer: Self-pay | Admitting: Oncology

## 2012-08-11 ENCOUNTER — Ambulatory Visit: Payer: Medicaid Other

## 2012-08-11 ENCOUNTER — Ambulatory Visit (HOSPITAL_BASED_OUTPATIENT_CLINIC_OR_DEPARTMENT_OTHER): Payer: Medicaid Other | Admitting: Hematology & Oncology

## 2012-08-11 ENCOUNTER — Ambulatory Visit (HOSPITAL_BASED_OUTPATIENT_CLINIC_OR_DEPARTMENT_OTHER): Payer: Medicaid Other | Admitting: Lab

## 2012-08-11 ENCOUNTER — Encounter: Payer: Self-pay | Admitting: Hematology & Oncology

## 2012-08-11 DIAGNOSIS — R599 Enlarged lymph nodes, unspecified: Secondary | ICD-10-CM

## 2012-08-11 DIAGNOSIS — C61 Malignant neoplasm of prostate: Secondary | ICD-10-CM

## 2012-08-11 DIAGNOSIS — R918 Other nonspecific abnormal finding of lung field: Secondary | ICD-10-CM

## 2012-08-11 DIAGNOSIS — C679 Malignant neoplasm of bladder, unspecified: Secondary | ICD-10-CM

## 2012-08-11 HISTORY — DX: Malignant neoplasm of prostate: C61

## 2012-08-11 LAB — CBC WITH DIFFERENTIAL (CANCER CENTER ONLY)
Eosinophils Absolute: 0.1 10*3/uL (ref 0.0–0.5)
HCT: 47.7 % (ref 38.7–49.9)
LYMPH%: 16.3 % (ref 14.0–48.0)
MCV: 80 fL — ABNORMAL LOW (ref 82–98)
MONO#: 0.9 10*3/uL (ref 0.1–0.9)
NEUT%: 76.3 % (ref 40.0–80.0)
RBC: 5.99 10*6/uL — ABNORMAL HIGH (ref 4.20–5.70)
WBC: 13.7 10*3/uL — ABNORMAL HIGH (ref 4.0–10.0)

## 2012-08-11 LAB — CMP (CANCER CENTER ONLY)
ALT(SGPT): 24 U/L (ref 10–47)
Albumin: 3.4 g/dL (ref 3.3–5.5)
BUN, Bld: 13 mg/dL (ref 7–22)
CO2: 32 mEq/L (ref 18–33)
Calcium: 9.4 mg/dL (ref 8.0–10.3)
Chloride: 99 mEq/L (ref 98–108)
Creat: 1 mg/dl (ref 0.6–1.2)
Potassium: 3.7 mEq/L (ref 3.3–4.7)

## 2012-08-11 LAB — LACTATE DEHYDROGENASE: LDH: 149 U/L (ref 94–250)

## 2012-08-11 MED ORDER — OXYCODONE HCL 15 MG PO TABS: ORAL_TABLET | ORAL | Status: DC

## 2012-08-11 MED ORDER — OXYCODONE HCL 20 MG PO TB12: 20.0000 mg | ORAL_TABLET | Freq: Two times a day (BID) | ORAL | Status: DC

## 2012-08-11 NOTE — Progress Notes (Signed)
This office note has been dictated.

## 2012-08-14 ENCOUNTER — Encounter (HOSPITAL_COMMUNITY): Payer: Self-pay | Admitting: Emergency Medicine

## 2012-08-14 ENCOUNTER — Telehealth: Payer: Self-pay | Admitting: *Deleted

## 2012-08-14 ENCOUNTER — Emergency Department (HOSPITAL_COMMUNITY)
Admission: EM | Admit: 2012-08-14 | Discharge: 2012-08-14 | Disposition: A | Discharge status: Home / Self Care | Payer: Medicaid Other | Attending: Emergency Medicine | Admitting: Emergency Medicine

## 2012-08-14 ENCOUNTER — Emergency Department (HOSPITAL_COMMUNITY): Payer: Medicaid Other

## 2012-08-14 DIAGNOSIS — Z8719 Personal history of other diseases of the digestive system: Secondary | ICD-10-CM | POA: Insufficient documentation

## 2012-08-14 DIAGNOSIS — M549 Dorsalgia, unspecified: Secondary | ICD-10-CM

## 2012-08-14 DIAGNOSIS — Z87891 Personal history of nicotine dependence: Secondary | ICD-10-CM | POA: Insufficient documentation

## 2012-08-14 DIAGNOSIS — Z79899 Other long term (current) drug therapy: Secondary | ICD-10-CM | POA: Insufficient documentation

## 2012-08-14 DIAGNOSIS — M545 Low back pain, unspecified: Secondary | ICD-10-CM | POA: Insufficient documentation

## 2012-08-14 DIAGNOSIS — Z859 Personal history of malignant neoplasm, unspecified: Secondary | ICD-10-CM

## 2012-08-14 DIAGNOSIS — C779 Secondary and unspecified malignant neoplasm of lymph node, unspecified: Secondary | ICD-10-CM | POA: Insufficient documentation

## 2012-08-14 DIAGNOSIS — Z87448 Personal history of other diseases of urinary system: Secondary | ICD-10-CM | POA: Insufficient documentation

## 2012-08-14 DIAGNOSIS — Z8551 Personal history of malignant neoplasm of bladder: Secondary | ICD-10-CM | POA: Insufficient documentation

## 2012-08-14 DIAGNOSIS — C7951 Secondary malignant neoplasm of bone: Secondary | ICD-10-CM | POA: Insufficient documentation

## 2012-08-14 DIAGNOSIS — Z8546 Personal history of malignant neoplasm of prostate: Secondary | ICD-10-CM | POA: Insufficient documentation

## 2012-08-14 DIAGNOSIS — Z8619 Personal history of other infectious and parasitic diseases: Secondary | ICD-10-CM | POA: Insufficient documentation

## 2012-08-14 DIAGNOSIS — Z8709 Personal history of other diseases of the respiratory system: Secondary | ICD-10-CM | POA: Insufficient documentation

## 2012-08-14 LAB — CBC WITH DIFFERENTIAL/PLATELET
Eosinophils Relative: 0 % (ref 0–5)
Hemoglobin: 15 g/dL (ref 13.0–17.0)
Lymphocytes Relative: 9 % — ABNORMAL LOW (ref 12–46)
Lymphs Abs: 1.4 10*3/uL (ref 0.7–4.0)
MCV: 78.6 fL (ref 78.0–100.0)
Monocytes Relative: 6 % (ref 3–12)
Neutrophils Relative %: 84 % — ABNORMAL HIGH (ref 43–77)
Platelets: 379 10*3/uL (ref 150–400)
RBC: 5.75 MIL/uL (ref 4.22–5.81)
WBC: 14.4 10*3/uL — ABNORMAL HIGH (ref 4.0–10.5)

## 2012-08-14 LAB — BASIC METABOLIC PANEL
BUN: 14 mg/dL (ref 6–23)
CO2: 29 mEq/L (ref 19–32)
Glucose, Bld: 112 mg/dL — ABNORMAL HIGH (ref 70–99)
Potassium: 3.6 mEq/L (ref 3.5–5.1)
Sodium: 137 mEq/L (ref 135–145)

## 2012-08-14 LAB — URINALYSIS, ROUTINE W REFLEX MICROSCOPIC
Ketones, ur: NEGATIVE mg/dL
Nitrite: POSITIVE — AB
Protein, ur: NEGATIVE mg/dL
Urobilinogen, UA: 0.2 mg/dL (ref 0.0–1.0)
pH: 6.5 (ref 5.0–8.0)

## 2012-08-14 LAB — URINE MICROSCOPIC-ADD ON

## 2012-08-14 MED ORDER — SODIUM CHLORIDE 0.9 % IV SOLN
Freq: Once | INTRAVENOUS | Status: AC
  Administered 2012-08-14: 18:00:00 via INTRAVENOUS

## 2012-08-14 MED ORDER — HYDROMORPHONE HCL 2 MG PO TABS: 2.0000 mg | ORAL_TABLET | ORAL | Status: DC | PRN

## 2012-08-14 MED ORDER — HYDROMORPHONE HCL PF 1 MG/ML IJ SOLN
1.0000 mg | Freq: Once | INTRAMUSCULAR | Status: AC
  Administered 2012-08-14: 1 mg via INTRAVENOUS
  Filled 2012-08-14: qty 1

## 2012-08-14 MED ORDER — ONDANSETRON HCL 4 MG/2ML IJ SOLN
4.0000 mg | Freq: Once | INTRAMUSCULAR | Status: AC
  Administered 2012-08-14: 4 mg via INTRAVENOUS
  Filled 2012-08-14: qty 2

## 2012-08-14 MED ORDER — METHYLPREDNISOLONE SODIUM SUCC 125 MG IJ SOLR
125.0000 mg | Freq: Once | INTRAMUSCULAR | Status: AC
  Administered 2012-08-14: 125 mg via INTRAVENOUS
  Filled 2012-08-14: qty 2

## 2012-08-14 NOTE — Telephone Encounter (Signed)
Pt called around 0930 this morning stating his back pain wasn't being controlled "with the little pills". He was unable to get the Oxycontin filled because it isn't covered by Medicaid but will be submitted for Prior Authorization. He was told to increase the oxycodone 15 mg to 2 pills every 4 hours. He called back around 1215 stating he took 2 pills after the 1st call then took a 3rd one after 45 minutes without any relief. Reviewed with Dr. Myna Hidalgo. Pt was offered to either come into the office for IV steroids and pain medications or go to the ER for evaluation. He decided to "wait it out and take the pills every 4 hours because I think they are starting to kick in".  He called back about 1.5 hours later stating he needs to go to the ER because he can't take the pain any longer. Explained the ER doctor will examine him and order what it needed once he gets there (no order from Dr Myna Hidalgo required). He/She will most likely call Dr Myna Hidalgo to inform him of the findings and make a plan from there. He verbalized understanding and is going to try to get his brother-in-law to take [facility] to [facility].  Of Note:  The pt did not take any medication through the night as he wasn't sure what to do. He was taking Percocet 5/325 q4h prn.

## 2012-08-14 NOTE — ED Provider Notes (Signed)
History     CSN: [id number]  Arrival date & time 08/14/12  1451   First MD Initiated Contact with Patient 08/14/12 1659      Chief Complaint  Patient presents with  . Back Pain    (Consider location/radiation/quality/duration/timing/severity/associated sxs/prior treatment) Patient is a 57 y.o. male presenting with back pain. The history is provided by the patient. No language interpreter was used.  Back Pain Location:  Lumbar spine Associated symptoms: no abdominal pain, no chest pain, no fever, no headaches and no weakness   Associated symptoms comment:  The patient is currently being evaluated for cancer and metastasis status. He has had a history of bladder and prostate cancer, thought not to have metastasized but now is having evidence of bony and lymphatic metastasis that is being evaluated by Dr. Myna Hidalgo. He is here for management of uncontrolled severe back pain similar to what he has been experiencing throughout his cancer work up. He denies new injury or change in location or character of the pain, only that his home medications are not effective in relieving it. He is taking 30mg  oxycodone. He was given oxycontin as well but reports that Medicaid has, so far, refused to cover the cost so he has not had this prescription filled.  No fever. No cough, change in appearance of urine (he has a urostomy), no abdominal pain or vomiting.    Past Medical History  Diagnosis Date  . Gross hematuria   . C. difficile enteritis 07/04/2011  . Diverticulitis   . Subcutaneous emphysema, postoperative 01/07/2012  . Cirrhosis 01/07/2012    Radiographically  . Splenomegaly   . Bladder cancer 01/07/2012    Transitional Cell Carcinoma, s/p bladder, prostate and lymph node resection with ileal conduit at [facility] on 10/9, discharged on 10/14   . Acute pyelonephritis 01/07/2012  . Prostate cancer 08/11/2012    Past Surgical History  Procedure Laterality Date  . Elbow surgery    . Transurethral  resection of bladder tumor  10/26/2011    Procedure: TRANSURETHRAL RESECTION OF BLADDER TUMOR (TURBT);  Surgeon: Ky Barban, MD;  Location: [facility];  Service: Urology;  Laterality: N/A;  . Transurethral resection of prostate  10/26/2011    Procedure: TRANSURETHRAL RESECTION OF THE PROSTATE (TURP);  Surgeon: Ky Barban, MD;  Location: [facility];  Service: Urology;  Laterality: N/A;    Family History  Problem Relation Age of Onset  . Heart failure Father   . Hypertension Mother     History  Substance Use Topics  . Smoking status: Former Smoker    Quit date: 12/03/2007  . Smokeless tobacco: Not on file  . Alcohol Use: No      Review of Systems  Constitutional: Negative for fever.  HENT: Negative for neck pain.   Respiratory: Negative for cough and shortness of breath.   Cardiovascular: Negative for chest pain.  Gastrointestinal: Negative for vomiting and abdominal pain.  Genitourinary: Negative for decreased urine volume.  Musculoskeletal: Positive for back pain.  Neurological: Negative for weakness and headaches.  Psychiatric/Behavioral: Negative for confusion.    Allergies  Review of patient's allergies indicates no known allergies.  Home Medications   Current Outpatient Rx  Name  Route  Sig  Dispense  Refill  . clonazePAM (KLONOPIN) 0.5 MG tablet   Oral   Take 0.5 mg by mouth 3 (three) times daily as needed for anxiety.          . mirtazapine (REMERON) 15 MG tablet   Oral  Take 15 mg by mouth at bedtime.         Marland Kitchen oxyCODONE (ROXICODONE) 15 MG immediate release tablet      Take 1 pill , IF NEEDED, for pain every 4-6hrs.   120 tablet   0   . oxyCODONE (OXYCONTIN) 20 MG 12 hr tablet   Oral   Take 1 tablet (20 mg total) by mouth every 12 (twelve) hours.   60 tablet   0     BP 159/75  Pulse 92  Temp(Src) 98.1 F (36.7 C) (Oral)  Resp 18  SpO2 97%  Physical Exam  Constitutional: He is oriented to person, place, and time. He appears  well-developed and well-nourished. No distress.  HENT:  Head: Normocephalic.  Neck: Normal range of motion. Neck supple.  Cardiovascular: Normal rate and regular rhythm.   Pulmonary/Chest: Effort normal and breath sounds normal.  Abdominal: Soft. Bowel sounds are normal. There is no tenderness. There is no rebound and no guarding.  Musculoskeletal: Normal range of motion.  Midline lumbar and left greater than right paralumbar tenderness. Firm/hard, non-mobile mass in left paralumbar back measuring approximately 3 cm in diameter.   Neurological: He is alert and oriented to person, place, and time.  Skin: Skin is warm and dry. No rash noted.  Psychiatric: He has a normal mood and affect.    ED Course  Procedures (including critical care time)  Labs Reviewed  URINALYSIS, ROUTINE W REFLEX MICROSCOPIC - Abnormal; Notable for the following:    APPearance CLOUDY (*)    Hgb urine dipstick SMALL (*)    Nitrite POSITIVE (*)    Leukocytes, UA MODERATE (*)    All other components within normal limits  URINE MICROSCOPIC-ADD ON - Abnormal; Notable for the following:    Squamous Epithelial / LPF FEW (*)    Bacteria, UA MANY (*)    All other components within normal limits  URINE CULTURE  CBC WITH DIFFERENTIAL  BASIC METABOLIC PANEL   Results for orders placed during the hospital encounter of 08/14/12  URINALYSIS, ROUTINE W REFLEX MICROSCOPIC      Result Value Range   Color, Urine YELLOW  YELLOW   APPearance CLOUDY (*) CLEAR   Specific Gravity, Urine 1.018  1.005 - 1.030   pH 6.5  5.0 - 8.0   Glucose, UA NEGATIVE  NEGATIVE mg/dL   Hgb urine dipstick SMALL (*) NEGATIVE   Bilirubin Urine NEGATIVE  NEGATIVE   Ketones, ur NEGATIVE  NEGATIVE mg/dL   Protein, ur NEGATIVE  NEGATIVE mg/dL   Urobilinogen, UA 0.2  0.0 - 1.0 mg/dL   Nitrite POSITIVE (*) NEGATIVE   Leukocytes, UA MODERATE (*) NEGATIVE  URINE MICROSCOPIC-ADD ON      Result Value Range   Squamous Epithelial / LPF FEW (*) RARE    WBC, UA 11-20  <3 WBC/hpf   RBC / HPF 3-6  <3 RBC/hpf   Bacteria, UA MANY (*) RARE   Urine-Other MUCOUS PRESENT    CBC WITH DIFFERENTIAL      Result Value Range   WBC 14.4 (*) 4.0 - 10.5 K/uL   RBC 5.75  4.22 - 5.81 MIL/uL   Hemoglobin 15.0  13.0 - 17.0 g/dL   HCT 16.1  09.6 - 04.5 %   MCV 78.6  78.0 - 100.0 fL   MCH 26.1  26.0 - 34.0 pg   MCHC 33.2  30.0 - 36.0 g/dL   RDW 40.9  81.1 - 91.4 %   Platelets 379  150 -  400 K/uL   Neutrophils Relative % 84 (*) 43 - 77 %   Neutro Abs 12.1 (*) 1.7 - 7.7 K/uL   Lymphocytes Relative 9 (*) 12 - 46 %   Lymphs Abs 1.4  0.7 - 4.0 K/uL   Monocytes Relative 6  3 - 12 %   Monocytes Absolute 0.9  0.1 - 1.0 K/uL   Eosinophils Relative 0  0 - 5 %   Eosinophils Absolute 0.0  0.0 - 0.7 K/uL   Basophils Relative 0  0 - 1 %   Basophils Absolute 0.0  0.0 - 0.1 K/uL  BASIC METABOLIC PANEL      Result Value Range   Sodium 137  135 - 145 mEq/L   Potassium 3.6  3.5 - 5.1 mEq/L   Chloride 100  96 - 112 mEq/L   CO2 29  19 - 32 mEq/L   Glucose, Bld 112 (*) 70 - 99 mg/dL   BUN 14  6 - 23 mg/dL   Creatinine, Ser 2.95  0.50 - 1.35 mg/dL   Calcium 9.6  8.4 - 28.4 mg/dL   GFR calc non Af Amer >90  >90 mL/min   GFR calc Af Amer >90  >90 mL/min   Dg Chest 2 View  08/14/2012   *RADIOLOGY REPORT*  Clinical Data: Elevated white blood cell count.  CHEST - 2 VIEW  Comparison: 01/07/2012.  Findings: The cardiac silhouette, mediastinal and hilar contours are stable.  The right pulmonary hilum is prominent but unchanged and this is due to a prominent right pulmonary artery.  The lungs are clear.  No pleural effusion.  The bony thorax is intact.  IMPRESSION: No acute cardiopulmonary findings.  Stable prominent right pulmonary artery.   Original Report Authenticated By: Rudie Meyer, M.D.   No results found.   No diagnosis found. 1. Back pain 2. History of cancer    MDM  Discussed findings and patient condition with Dr. Myna Hidalgo. Ok to discharge home to resume  current outpatient evaluation for source of his pain. Will prescribe oral Dilaudid as he is getting some pain relief here and instructed him to stop taking the oxycodone 15 mg.        Arnoldo Hooker, PA-C 08/14/12 1951

## 2012-08-14 NOTE — ED Notes (Signed)
Pt was seen by huis oncologist and states that they sent him over here for pain control and back pain. Pt states that he took the pain medication and it is not helping and he can not eat anything,

## 2012-08-14 NOTE — Progress Notes (Signed)
CC:   [facility] Department, Fax [phone] Odella Aquas, MD  DIAGNOSES: 1. 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 2. 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 3. New pulmonary nodules and abdominal/pelvic lymphadenopathy.  HISTORY OF PRESENT ILLNESS:  Mr. [name] is a very nice 57 year old white gentleman.  He has been in pretty good health.  He has no significant past medical problems.  He subsequently presented with hematuria last year.  He had painless hematuria.  He had no abdominal pain.  He had no change in bowel habits. He had no cough or shortness of breath.  He had no bony pain.  He basically had about 6 months of hematuria before he had a urological evaluation.  He underwent I think a cystoscopy.  This was done and biopsies were taken of a bladder mass.  The cystoscopy was done on 10/27/2011.  The pathology report ([id number]) showed an invasive high-grade transitional cell carcinoma with squamous differentiation.  This invaded into the bladder wall.  He was referred to [facility].  He subsequently underwent a robotic cystoprostatectomy.  This was done on 12/22/2011.  The pathology report ([id number]) showed stage II (T2a N0 M0) low-grade transitional cell carcinoma of the bladder.  Multiple lymph nodes were taken and all were negative.  He also was found to have a stage II prostate cancer (T2c N0 M0).  His Gleason grade was only 6.  Again, multiple lymph nodes were taken.  He had the typical preop studies.  Again, there was no evidence of metastatic disease.  I am not sure what his preop PSA was.  He did okay with surgery.  His recovery was pretty uneventful.  Over the past month or so, he has had worsening abdominal pain.  He has had hip pain.  He has had weight loss.  Appetite has gone down.  He subsequently had a CT scan of the chest, abdomen and pelvis on April 27 of this year.  Shockingly enough, this  showed multiple bilateral pulmonary nodules.  Most measured 5-7 mm in diameter.  He had lymphadenopathy within the pelvis.  There was retroperitoneal lymphadenopathy.  He had left inguinal adenopathy.  This was also suspicious for metastatic disease.  He was kindly referred to the [facility] for an evaluation.  Appetite is still down.  He just says he gets full easily.  He has some nausea.  His pain is his biggest problem.  He really has had difficulty with pain control.  He is on Percocet.  He says he takes 2 every 4 hours for relief.  He has had no bleeding.  He has a urostomy that is intact.  There has been no leg swelling.  He has had no cough.  He has had no shortness of breath.  He has had no headache.  PAST MEDICAL HISTORY:  Again, is pretty much unremarkable.  ALLERGIES:  None.  MEDICATIONS: 1. Klonopin 0.5 mg p.o. t.i.d. p.r.n. 2. Remeron 15 mg p.o. q.h.s. 3. Percocet (5/325) two p.o. q.4 hours p.r.n.  SOCIAL HISTORY:  Remarkable for past tobacco use.  He stopped back in 2009.  He probably has about a 30 pack year history of tobacco use. There is no significant alcohol use.  He did work in [employer]. There were some occupational exposures to asbestos, dust.  FAMILY HISTORY:  Negative for any malignancy in the family.  REVIEW OF SYSTEMS:  As stated in the history of present illness.  He is mostly bothered by pain.  His pain is from his pelvis down.  PHYSICAL EXAMINATION:  General:  This is a well-developed, well- nourished white gentleman in no obvious distress.  Vital signs:  Show a temperature of 98.1, pulse 92, respiratory rate 18, blood pressure 124/92.  Weight is 207.  Head and neck:  Shows a normocephalic, atraumatic skull.  There are no ocular or oral lesions.  There are no palpable cervical or supraclavicular lymph nodes.  Lungs:  Clear bilaterally.  Cardiac:  Regular rate and rhythm with a normal S1 and S2. There are no murmurs,  rubs or bruits.  Abdomen:  Soft with good bowel sounds.  He has well-healed laparoscopy scars.  He has a urostomy in the right lower quadrant.  There is no guarding or rebound tenderness.  He has no palpable hepatosplenomegaly.  I cannot palpate any obvious inguinal lymph nodes.  Back:  Exam shows no tenderness over the spine, ribs or hips.  Extremities:  Show no clubbing, cyanosis or edema.  He has good range motion of his joints.  Skin:  Exam shows no rashes, ecchymoses or petechiae.  Neurological:  Shows no focal neurological deficits.  LABORATORY STUDIES:  White cell count is 13.7, hemoglobin 15.6, hematocrit 47.7, platelet count 424.  Sodium 140, potassium 3.7, BUN 13, creatinine 1.0.  Calcium is 9.4 with an albumin of 3.4.  Total protein 8.5.  IMPRESSION:  [name] is a 57 year old gentleman with 2 separate malignancies that were resected in 1 surgery.  I am not clear at all as to what is going on now with him.  It looks like he has pathologic lymphadenopathy in the abdomen and pelvis.  He also has pulmonary nodules.  By the pathology reports, neither malignancy looked all that bad.  He had stage II bladder cancer but this only invaded into the inner half of the bladder wall.  His prostate cancer was only a Gleason grade 6.  I did send off a PSA on him.  I will be interested to see what the PSA shows.  I suppose that if the PSA is markedly elevated, then we will know that this is metastatic prostate cancer.  I would like to get a PET scan on him.  I think this would be helpful. We could see exactly what the extent of his disease is.  We ultimately still may need to get a biopsy.  I think the easiest site for biopsy would be in the left inguinal region.  I will probably get General Surgery to actually do an excisional biopsy of 1 of the lymph nodes.  We need to improve his pain control.  I will put him on OxyContin at 20 mg twice a day.  I think he is going to need  long-term pain control and time released pain medication I think is appropriate.  I also want to try to avoid the Percocet as well as Tylenol.  I will put him on oxycodone at 15 mg.  Hopefully, this will provide some short-term breakthrough pain relief.  We will try to get through this situation as quickly as possible.  We will get the PET scan for next week.  We will see what the PET scan shows.  I will then speak with one of the surgeons.  I spent a good hour and a half with Mr. Strollo and his wife.  They are both very, very nice.  It is hard to make any kind of treatment recommendations until  we know exactly what we are dealing with.  I could not imagine that he has a third cancer.  However, this is a possibility given the relatively low-grade potential of his initial bladder and prostate cancer.    ______________________________ Josph Macho, M.D. PRE/MEDQ  D:  08/11/2012  T:  08/12/2012  Job:  [id number]

## 2012-08-15 ENCOUNTER — Inpatient Hospital Stay (HOSPITAL_COMMUNITY)
Admission: AD | Admit: 2012-08-15 | Discharge: 2012-08-25 | DRG: 674 | Disposition: A | Discharge status: Home / Self Care | Payer: Medicaid Other | Source: Ambulatory Visit | Attending: Hematology & Oncology | Admitting: Hematology & Oncology

## 2012-08-15 ENCOUNTER — Ambulatory Visit: Payer: Medicaid Other | Admitting: Hematology & Oncology

## 2012-08-15 ENCOUNTER — Encounter (HOSPITAL_COMMUNITY): Payer: Self-pay

## 2012-08-15 ENCOUNTER — Other Ambulatory Visit: Payer: Self-pay | Admitting: *Deleted

## 2012-08-15 ENCOUNTER — Inpatient Hospital Stay (HOSPITAL_COMMUNITY): Payer: Medicaid Other

## 2012-08-15 ENCOUNTER — Ambulatory Visit: Payer: Medicaid Other

## 2012-08-15 ENCOUNTER — Other Ambulatory Visit: Payer: Self-pay | Admitting: Hematology & Oncology

## 2012-08-15 DIAGNOSIS — Z87891 Personal history of nicotine dependence: Secondary | ICD-10-CM

## 2012-08-15 DIAGNOSIS — F411 Generalized anxiety disorder: Secondary | ICD-10-CM | POA: Diagnosis present

## 2012-08-15 DIAGNOSIS — C7951 Secondary malignant neoplasm of bone: Secondary | ICD-10-CM | POA: Diagnosis present

## 2012-08-15 DIAGNOSIS — C679 Malignant neoplasm of bladder, unspecified: Principal | ICD-10-CM | POA: Diagnosis present

## 2012-08-15 DIAGNOSIS — C61 Malignant neoplasm of prostate: Secondary | ICD-10-CM | POA: Diagnosis present

## 2012-08-15 DIAGNOSIS — C774 Secondary and unspecified malignant neoplasm of inguinal and lower limb lymph nodes: Secondary | ICD-10-CM | POA: Diagnosis present

## 2012-08-15 DIAGNOSIS — K7460 Unspecified cirrhosis of liver: Secondary | ICD-10-CM | POA: Diagnosis present

## 2012-08-15 DIAGNOSIS — E43 Unspecified severe protein-calorie malnutrition: Secondary | ICD-10-CM | POA: Insufficient documentation

## 2012-08-15 DIAGNOSIS — G893 Neoplasm related pain (acute) (chronic): Secondary | ICD-10-CM

## 2012-08-15 DIAGNOSIS — N390 Urinary tract infection, site not specified: Secondary | ICD-10-CM | POA: Diagnosis present

## 2012-08-15 DIAGNOSIS — Z938 Other artificial opening status: Secondary | ICD-10-CM

## 2012-08-15 DIAGNOSIS — J438 Other emphysema: Secondary | ICD-10-CM | POA: Diagnosis present

## 2012-08-15 LAB — CREATININE, SERUM
Creatinine, Ser: 0.77 mg/dL (ref 0.50–1.35)
GFR calc Af Amer: >90 mL/min (ref >90)
GFR calc non Af Amer: >90 mL/min (ref >90)

## 2012-08-15 LAB — CBC
MCV: 78.2 fL (ref 78.0–100.0)
Platelets: 421 10*3/uL — ABNORMAL HIGH (ref 150–400)
RBC: 5.5 MIL/uL (ref 4.22–5.81)
WBC: 13.4 10*3/uL — ABNORMAL HIGH (ref 4.0–10.5)

## 2012-08-15 MED ORDER — HYDROMORPHONE HCL PF 4 MG/ML IJ SOLN
4.0000 mg | INTRAMUSCULAR | Status: DC | PRN
  Administered 2012-08-18 – 2012-08-24 (×20): 4 mg via INTRAVENOUS
  Filled 2012-08-15 (×17): qty 1

## 2012-08-15 MED ORDER — ONDANSETRON HCL 4 MG/2ML IJ SOLN: 4.0000 mg | Freq: Three times a day (TID) | INTRAMUSCULAR | Status: DC | PRN

## 2012-08-15 MED ORDER — ENOXAPARIN SODIUM 40 MG/0.4ML ~~LOC~~ SOLN
40.0000 mg | Freq: Every day | SUBCUTANEOUS | Status: DC
  Administered 2012-08-15: 40 mg via SUBCUTANEOUS
  Filled 2012-08-15 (×2): qty 0.4

## 2012-08-15 MED ORDER — SODIUM CHLORIDE 0.9 % IV SOLN
2.0000 mg/h | INTRAVENOUS | Status: DC
  Administered 2012-08-15 – 2012-08-17 (×3): 2 mg/h via INTRAVENOUS
  Filled 2012-08-15 (×3): qty 5

## 2012-08-15 MED ORDER — GADOBENATE DIMEGLUMINE 529 MG/ML IV SOLN
20.0000 mL | Freq: Once | INTRAVENOUS | Status: AC | PRN
  Administered 2012-08-15: 20 mL via INTRAVENOUS

## 2012-08-15 MED ORDER — SODIUM CHLORIDE 0.9 % IV SOLN
INTRAVENOUS | Status: DC
  Administered 2012-08-15 – 2012-08-22 (×7): via INTRAVENOUS
  Administered 2012-08-22: 1000 mL via INTRAVENOUS
  Administered 2012-08-23 – 2012-08-24 (×2): via INTRAVENOUS

## 2012-08-15 MED ORDER — DOCUSATE SODIUM 100 MG PO CAPS
100.0000 mg | ORAL_CAPSULE | Freq: Two times a day (BID) | ORAL | Status: DC
  Administered 2012-08-15 – 2012-08-24 (×15): 100 mg via ORAL
  Filled 2012-08-15 (×22): qty 1

## 2012-08-15 NOTE — Progress Notes (Signed)
Patient admitted to hospital. No treatment received here.

## 2012-08-15 NOTE — ED Provider Notes (Signed)
Medical screening examination/treatment/procedure(s) were performed by non-physician practitioner and as supervising physician I was immediately available for consultation/collaboration.  Latoia Eyster L Bobbyjo Marulanda, MD 08/15/12 0029 

## 2012-08-15 NOTE — H&P (Signed)
[name] is a 57yo I white male with likely metastatic bladder cancer. I saw him initially last week. Return to get him worked up to document metastatic bladder cancer.  He's had a lot of pain issues. He went to the emergency room yesterday. We will we saw him, we put him on OxyContin (which he could not get because of Medicare). He was also given oxycodone for breakthrough pain. He's been taking 2 or 3 of these every 3 hours with no relief.  The pain is centered around the sacroiliac region. Nothing seems to help the pain. He went to the emergency room. He him IV Dilaudid. This did not help him. I sent him home with some Dilaudid pills.  We need to get his pain under control. As such, he is going to need to be admitted for IV pain medication. While he is in the hospital, he will have his workup completed and probably a biopsy of an enlarged left inguinal lymph nodes.  He's had no bleeding. He has a urostomy bag is working well. He's had no cough. He's had no leg swelling.  His past medical history appeared unremarkable. He did have his bladder and prostate cancer removed last year. He had stage II bladder cancer and stage II prostate cancer. I checked his PSA last week. It was 0.  Allergies are none.  Medications are documented in the medical record.  Social history is remarkable for past tobacco use. There is no significant alcohol use.  [name], this is a fairly well-developed well-nourished white gentleman in moderate distress secondary to back pain. Vital signs are temperature of 97 4 pulse 96 with to 18 blood pressure 170/101. [name]. He has no adenopathy in the neck. There is no scleral icterus. Lungs are clear bilaterally. Cardiac exam regular rate and rhythm with a normal S1-S2. There are no murmurs rubs or bruits. Abdomen is soft. He has a urostomy in the right lower portion. There is no fluid wave. There is no obvious palpable inguinal lymph  nodes. There is no palpable hepato- splenomegaly back exam shows some tenderness in the sacroiliac region. He does have some potential subcutaneous nodules there. These are tender. [name]. He has good range of motion of his joints. Skin exam shows no rashes. Neurological exam shows no focal neurological deficits.  Impression: [name] is a 57 year old gentleman. He is being admitted because of pain control issues.  While he was in the hospital, I would do an MRI of his lumbosacral spine. I guess he is a pathologic fractures that could be causing his pain issues. I want to get a PET scan on him. I wanted to do this as an outpatient but he will need to have this as an inpatient as this will be necessary to guide [place] in determining his treatment options.  I will also get surgery to see him. I must have a tissue diagnosis. He does have enlarged left inguinal lymph nodes on CT scan.  I'll start him on a Dilaudid infusion. Pharmacy will help me out with this.  [name] understands and accepts why he needs to be admitted. He's relieved that he is going to be admitted so that his pain can hopefully be under better control.  [name] 11:28-30

## 2012-08-16 ENCOUNTER — Encounter (HOSPITAL_COMMUNITY): Payer: Self-pay | Admitting: *Deleted

## 2012-08-16 DIAGNOSIS — C61 Malignant neoplasm of prostate: Secondary | ICD-10-CM

## 2012-08-16 DIAGNOSIS — R599 Enlarged lymph nodes, unspecified: Secondary | ICD-10-CM

## 2012-08-16 DIAGNOSIS — G8929 Other chronic pain: Secondary | ICD-10-CM

## 2012-08-16 LAB — URINE CULTURE

## 2012-08-16 LAB — PROTIME-INR: Prothrombin Time: 17.9 seconds — ABNORMAL HIGH (ref 11.6–15.2)

## 2012-08-16 MED ORDER — PANTOPRAZOLE SODIUM 40 MG PO TBEC
40.0000 mg | DELAYED_RELEASE_TABLET | Freq: Every day | ORAL | Status: DC
  Administered 2012-08-16 – 2012-08-25 (×9): 40 mg via ORAL
  Filled 2012-08-16 (×10): qty 1

## 2012-08-16 MED ORDER — DIPHENHYDRAMINE HCL 25 MG PO CAPS
25.0000 mg | ORAL_CAPSULE | Freq: Four times a day (QID) | ORAL | Status: DC | PRN
  Administered 2012-08-16: 25 mg via ORAL
  Filled 2012-08-16: qty 1

## 2012-08-16 MED ORDER — DEXAMETHASONE SODIUM PHOSPHATE 4 MG/ML IJ SOLN: 40.0000 mg | Freq: Every day | INTRAMUSCULAR | Status: DC

## 2012-08-16 MED ORDER — SODIUM CHLORIDE 0.9 % IV SOLN
40.0000 mg | Freq: Every day | INTRAVENOUS | Status: DC
  Administered 2012-08-16 – 2012-08-17 (×2): 40 mg via INTRAVENOUS
  Filled 2012-08-16 (×3): qty 4

## 2012-08-16 MED ORDER — HYDROCORTISONE 1 % EX CREA
TOPICAL_CREAM | Freq: Three times a day (TID) | CUTANEOUS | Status: DC | PRN
  Administered 2012-08-16 – 2012-08-22 (×2): via TOPICAL
  Filled 2012-08-16 (×2): qty 28

## 2012-08-16 MED ORDER — CEFAZOLIN SODIUM-DEXTROSE 2-3 GM-% IV SOLR
2.0000 g | INTRAVENOUS | Status: AC
  Administered 2012-08-17: 2 g via INTRAVENOUS

## 2012-08-16 MED ORDER — ENOXAPARIN SODIUM 40 MG/0.4ML ~~LOC~~ SOLN
40.0000 mg | Freq: Every day | SUBCUTANEOUS | Status: DC
  Administered 2012-08-17 – 2012-08-24 (×5): 40 mg via SUBCUTANEOUS
  Filled 2012-08-16 (×9): qty 0.4

## 2012-08-16 NOTE — Consult Note (Signed)
Skip Estimable Odessa Fleming [birth year]  [id number].   Requesting MD: Dr. Arlan Organ Chief Complaint/Reason for Consult: lymphadenopathy HPI: This is a 57 yo male who has been diagnosed with stage 2 bladder and prostate cancer.  He underwent excision of both of these and has a urostomy.  This was done at [facility].  He states they took some LNs but did not show any advancement of his disease.  Therefore, he never require chemo or radiation.    He now presents to [facility] with severe back pain that has been present for the last several weeks.  He has been getting different types of pain medications from Dr. Myna Hidalgo as an outpatient which do not seem to be helping.  He was admitted yesterday for pain control.  In the meantime, he also had a CT scan done in April that revealed some inguinal LNs.  His MRI now reveals what looks like metastatic disease to his spine, lungs, and possible multiple lymph sites.  We have been asked to perform an excisional LN biopsy to determine which type of cancer this is so they can better treat the patient.  Review of Systems: Please see HPI, otherwise all other systems are currently negative  Family History  Problem Relation Age of Onset  . Heart failure Father   . Hypertension Mother     Past Medical History  Diagnosis Date  . Gross hematuria   . C. difficile enteritis 07/04/2011  . Diverticulitis   . Subcutaneous emphysema, postoperative 01/07/2012  . Cirrhosis 01/07/2012    Radiographically  . Splenomegaly   . Bladder cancer 01/07/2012    Transitional Cell Carcinoma, s/p bladder, prostate and lymph node resection with ileal conduit at [facility] on 10/9, discharged on 10/14   . Acute pyelonephritis 01/07/2012  . Prostate cancer 08/11/2012    Past Surgical History  Procedure Laterality Date  . Elbow surgery    . Transurethral resection of bladder tumor  10/26/2011    Procedure: TRANSURETHRAL RESECTION OF BLADDER TUMOR (TURBT);  Surgeon: Ky Barban, MD;   Location: [facility];  Service: Urology;  Laterality: N/A;  . Transurethral resection of prostate  10/26/2011    Procedure: TRANSURETHRAL RESECTION OF THE PROSTATE (TURP);  Surgeon: Ky Barban, MD;  Location: [facility];  Service: Urology;  Laterality: N/A;    Social History:  reports that he quit smoking about 4 years ago. He has never used smokeless tobacco. He reports that he does not drink alcohol or use illicit drugs.  Allergies: No Known Allergies  Medications Prior to Admission  Medication Sig Dispense Refill  . clonazePAM (KLONOPIN) 0.5 MG tablet Take 0.5 mg by mouth 3 (three) times daily as needed for anxiety.       Marland Kitchen HYDROmorphone (DILAUDID) 2 MG tablet Take 1 tablet (2 mg total) by mouth every 4 (four) hours as needed for pain.  30 tablet  0  . mirtazapine (REMERON) 15 MG tablet Take 15 mg by mouth at bedtime.      Marland Kitchen oxyCODONE (ROXICODONE) 15 MG immediate release tablet Take 15 mg by mouth every 4 (four) hours as needed for pain.        Blood pressure 129/92, pulse 106, temperature 97.4 F (36.3 C), temperature source Oral, resp. rate 18, height 5\' 11"  (1.803 m), weight 203 lb (92.08 kg), SpO2 95.00%. Physical Exam: General: pleasant, WD, WN white male who is laying in bed in NAD HEENT: head is normocephalic, atraumatic.  Sclera are noninjected.  PERRL.  Ears  and nose without any masses or lesions.  Mouth is pink and moist Heart: regular, rate, and rhythm.  Normal s1,s2. No obvious murmurs, gallops, or rubs noted.  Palpable radial and pedal pulses bilaterally Lungs: CTAB, no wheezes, rhonchi, or rales noted.  Respiratory effort nonlabored Abd: soft, NT, ND, +BS, no masses, hernias, or organomegaly Lypmh: no cervical, supraclavicular, or axillary LNs palpable.  No right inguinal nodes palpable.  There is one node that I can palpate in the left inguinal region.  This is nontender. Psych: A&Ox3 with an appropriate affect.    Results for orders placed during the hospital encounter  of 08/15/12 (from the past 48 hour(s))  CBC     Status: Abnormal   Collection Time    08/15/12  2:21 PM      Result Value Range   WBC 13.4 (*) 4.0 - 10.5 K/uL   RBC 5.50  4.22 - 5.81 MIL/uL   Hemoglobin 14.0  13.0 - 17.0 g/dL   HCT 16.1  09.6 - 04.5 %   MCV 78.2  78.0 - 100.0 fL   MCH 25.5 (*) 26.0 - 34.0 pg   MCHC 32.6  30.0 - 36.0 g/dL   RDW 40.9  81.1 - 91.4 %   Platelets 421 (*) 150 - 400 K/uL  CREATININE, SERUM     Status: None   Collection Time    08/15/12  2:21 PM      Result Value Range   Creatinine, Ser 0.77  0.50 - 1.35 mg/dL   GFR calc non Af Amer >90  >90 mL/min   GFR calc Af Amer >90  >90 mL/min   Comment:            The eGFR has been calculated     using the CKD EPI equation.     This calculation has not been     validated in all clinical     situations.     eGFR's persistently     <90 mL/min signify     possible Chronic Kidney Disease.   Dg Chest 2 View  08/14/2012   *RADIOLOGY REPORT*  Clinical Data: Elevated white blood cell count.  CHEST - 2 VIEW  Comparison: 01/07/2012.  Findings: The cardiac silhouette, mediastinal and hilar contours are stable.  The right pulmonary hilum is prominent but unchanged and this is due to a prominent right pulmonary artery.  The lungs are clear.  No pleural effusion.  The bony thorax is intact.  IMPRESSION: No acute cardiopulmonary findings.  Stable prominent right pulmonary artery.   Original Report Authenticated By: Rudie Meyer, M.D.   Mr Lumbar Spine W Wo Contrast  08/16/2012   *RADIOLOGY REPORT*  Clinical Data: Severe low back pain.  Bladder cancer.  MRI LUMBAR SPINE WITHOUT AND WITH CONTRAST  Technique:  Multiplanar and multiecho pulse sequences of the lumbar spine were obtained without and with intravenous contrast.  Contrast: 20mL MULTIHANCE GADOBENATE DIMEGLUMINE 529 MG/ML IV SOLN  Comparison: CT scan of the abdomen and pelvis dated 07/10/2012  Findings: The patient has extensive periaortic adenopathy as well as adenopathy  along the iliac vessels in the pelvis on the right and left.  This is consistent with metastatic bladder cancer.  Normal conus tip at T11-12.  There is a 2.8 x 2.7 x 2.0 cm enhancing metastasis in the anterior aspect of the L4 vertebral body.  Almost the entire L5 vertebral body is infiltrated by tumor.  There is what is most likely a pathologic Schmorl's node  in the inferior endplate of L5.  T11-12:  Severe left facet arthritis with left foraminal stenosis which could affect the left T11 nerve.  The disc is normal.  T12-L1:  Normal disc.  Bilateral facet arthritis with bilateral foraminal stenosis, right greater than left.  L1-2 and L2-3:  Normal.  L3-4:  Minimal disc bulge into the left neural foramen.  Bilateral facet hypertrophy.  No neural impingement.  L4-5:  Severe bilateral facet arthritis, right greater than left. The disc is normal.  Left foraminal stenosis.  L5-S1:  Severe bilateral facet arthritis.  Small annular tear and a tiny disc bulge at the level of the left neural foramen.  Left foraminal stenosis.  IMPRESSION:  1.  Extensive periaortic adenopathy and pelvic adenopathy consistent with metastatic bladder cancer. 2.  Metastatic lesions in L4 and L5 with a pathologic Schmorl's node in the inferior endplate of L5. 3.  Severe bilateral facet arthritis at L4-5 and L5-S1.   Original Report Authenticated By: Francene Boyers, M.D.       Assessment/Plan 1. Metastatic disease from either bladder or prostate cancer 2. Lymphadenopathy 3. H/o bladder and prostate cancer  Plan: 1. Will plan for excisional biopsy tomorrow of palpable left inguinal node. 2. NPO p MN  OSBORNE,KELLY E 08/16/2012, 12:20 PM Pager: [phone]  I have seen and examined this patient.  He does have some palpable inguinal nodes bilaterally L>R.  We have been asked to get excisional bx of these.  I have discussed the procedure with the patient and discussed the risks and explained that this will not be therapeutic.  Risks of  infection, bleeding, pain, scarring, nondiagnostic biopsy, lymphocele, and nerve injury or vascular injury discussed and he expressed understanding and will plan for left inguinal lymph node bx.

## 2012-08-16 NOTE — Progress Notes (Signed)
Mr. [name] is much more comfortable. He is on a Dilaudid drip at 2 mg an hour.  The MRI was done yesterday. It looks like L5 is totally replaced by tumor. Am sure this is where his pain is coming from. I will see if interventional radiology can do augmented kyphoplasty. I think this might help. I also suspect that he may need some radiation treatments.  He will need to have a PET scan done. Again, this must be done as an inpatient as he just is not able to go home. We must have the PET scan to help guide [place] in our therapy decisions.  His appetite still not that great.  Am going to put him on some steroids. I think this might help with his appetite. Hopefully will also help with the back issues.  His PSA was negative. This must be bladder cancer that has metastasized or I suppose another type of malignancy.  His vital signs are all stable.  He does state is some slight dysphagia. I much or as to why this would be. He's never had this before.  I will speak with the surgeons and see about doing a biopsy of one of the left inguinal nodes. I would think this would give [place] a good tissue diagnosis. I suppose we could also consider a tissue diagnosis from his kyphoplasty but I much or how much tissue they can obtain that way.   Again, we still need to continue his workup. I am glad that he is more comfortable.  [name]  Psalm 62:2

## 2012-08-16 NOTE — Progress Notes (Signed)
INITIAL NUTRITION ASSESSMENT  Pt meets criteria for severe MALNUTRITION in the context of acute illness as evidenced by <50% estimated energy intake in the past week with 12% weight loss in the past 2 months.  DOCUMENTATION CODES Per approved criteria  -Severe malnutrition in the context of acute illness or injury   INTERVENTION: - Encouraged intake of bland foods and gradually increasing calorie/protein intake - Assisted pt with ordering meals - Will continue to monitor   NUTRITION DIAGNOSIS: Inadequate oral intake related to poor appetite as evidenced by pt statement.   Goal: Pt to consume >90% of meals.   Monitor:  Weights, labs, intake  Reason for Assessment: Consult   57 y.o. male  Admitting Dx: Back pain  ASSESSMENT: Pt with stage 2 bladder CA and stage 2 prostate CA with urostomy. Met with pt who reports for the past 2-3 weeks he has had stomach issues - which include ongoing nausea and vomiting after every meal. Pt reports the only thing he could keep down was cereal. Pt reports 12 pound unintended weight loss in the past week r/t poor appetite and inability to eat. Oncologist plans to start pt on steroids to help with appetite and back pain. Pt denies any nausea or diarrhea since admission. Pt dislikes nutritional supplements. Discussed high calorie/protein foods he could add to his diet. Also talked about foods/drinks that can help with diarrhea.   Height: Ht Readings from Last 1 Encounters:  08/15/12 5\' 11"  (1.803 m)    Weight: Wt Readings from Last 1 Encounters:  08/15/12 203 lb (92.08 kg)    Ideal Body Weight: 172 lb  % Ideal Body Weight: 118  Wt Readings from Last 10 Encounters:  08/15/12 203 lb (92.08 kg)  08/11/12 207 lb (93.895 kg)  07/09/12 252 lb (114.306 kg)  06/21/12 231 lb (104.781 kg)  05/28/12 230 lb (104.327 kg)  05/18/12 227 lb (102.967 kg)  04/16/12 220 lb (99.791 kg)  03/29/12 226 lb 6.6 oz (102.7 kg)  03/10/12 220 lb (99.791 kg)   01/12/12 207 lb 7.3 oz (94.1 kg)    Usual Body Weight: 215 lb last week per pt  % Usual Body Weight: 94  BMI:  Body mass index is 28.33 kg/(m^2).  Estimated Nutritional Needs: Kcal: 1850-2100 Protein: 95-110g Fluid: 1.8-2.1L/day  Skin: Intact   Diet Order: General  EDUCATION NEEDS: -Education needs addressed - discussed diet therapy for diarrhea and ways to add calories/protein to diet    Intake/Output Summary (Last 24 hours) at 08/16/12 1100 Last data filed at 08/16/12 0415  Gross per 24 hour  Intake  728.2 ml  Output    600 ml  Net  128.2 ml    Last BM: 6/2  Labs:   Recent Labs Lab 08/11/12 1408 08/14/12 1730 08/15/12 1421  NA 140 137  --   K 3.7 3.6  --   CL 99 100  --   CO2 32 29  --   BUN 13 14  --   CREATININE 1.0 0.83 0.77  CALCIUM 9.4 9.6  --   GLUCOSE 91 112*  --     CBG (last 3)  No results found for this basename: GLUCAP,  in the last 72 hours  Scheduled Meds: . dexamethasone (DECADRON) IVPB  40 mg Intravenous Daily  . docusate sodium  100 mg Oral BID  . enoxaparin (LOVENOX) injection  40 mg Subcutaneous QHS  . pantoprazole  40 mg Oral Daily    Continuous Infusions: . sodium chloride 50  mL/hr at 08/15/12 1440  . HYDROmorphone 2 mg/hr (08/15/12 1437)    Past Medical History  Diagnosis Date  . Gross hematuria   . C. difficile enteritis 07/04/2011  . Diverticulitis   . Subcutaneous emphysema, postoperative 01/07/2012  . Cirrhosis 01/07/2012    Radiographically  . Splenomegaly   . Bladder cancer 01/07/2012    Transitional Cell Carcinoma, s/p bladder, prostate and lymph node resection with ileal conduit at [facility] on 10/9, discharged on 10/14   . Acute pyelonephritis 01/07/2012  . Prostate cancer 08/11/2012    Past Surgical History  Procedure Laterality Date  . Elbow surgery    . Transurethral resection of bladder tumor  10/26/2011    Procedure: TRANSURETHRAL RESECTION OF BLADDER TUMOR (TURBT);  Surgeon: Ky Barban, MD;   Location: [facility];  Service: Urology;  Laterality: N/A;  . Transurethral resection of prostate  10/26/2011    Procedure: TRANSURETHRAL RESECTION OF THE PROSTATE (TURP);  Surgeon: Ky Barban, MD;  Location: [facility];  Service: Urology;  Laterality: N/A;     Levon Hedger MS, RD, LDN [phone] Pager [phone] After Hours Pager

## 2012-08-16 NOTE — Care Management Note (Signed)
CARE MANAGEMENT NOTE 08/16/2012  Patient:  [name], [name]   Account Number:  [id number]  Date Initiated:  08/16/2012  Documentation initiated by:  Terrance Lanahan  Subjective/Objective Assessment:   57 yo male admitted with uncontrollable pain related to malignancy. PTA pt from home with spouse. Pt currently active with [facility].     Action/Plan:   home when stable   Anticipated DC Date:     Anticipated DC Plan:  HOME W HOME HEALTH SERVICES      DC Planning Services  CM consult      Choice offered to / List presented to:  NA   DME arranged  NA      DME agency  NA     HH arranged  HH-1 RN      [facility] agency  [facility] HEALTH   Status of service:  In process, will continue to follow Medicare Important Message given?   (If response is "NO", the following Medicare IM given date fields will be blank) Date Medicare IM given:   Date Additional Medicare IM given:    Discharge Disposition:    Per UR Regulation:  Reviewed for med. necessity/level of care/duration of stay  If discussed at Long Length of Stay Meetings, dates discussed:    Comments:  08/16/12 1600 Lizbett Garciagarcia,RN,BSN [phone] Cm spoke with patient at length cocnerning discharge planning. Per pt currently residing at home with spouse. Pt states receiving services from [facility] for ostomy. Pt states using no DMe for home use.

## 2012-08-16 NOTE — Progress Notes (Signed)
Pt c/o itching. Midlevel paged awaiting call back.

## 2012-08-17 ENCOUNTER — Inpatient Hospital Stay (HOSPITAL_COMMUNITY): Payer: Medicaid Other | Admitting: Anesthesiology

## 2012-08-17 ENCOUNTER — Encounter (HOSPITAL_COMMUNITY): Payer: Self-pay | Admitting: Anesthesiology

## 2012-08-17 ENCOUNTER — Encounter (HOSPITAL_COMMUNITY)
Admission: AD | Disposition: A | Discharge status: Home / Self Care | Payer: Self-pay | Source: Ambulatory Visit | Attending: Hematology & Oncology

## 2012-08-17 ENCOUNTER — Other Ambulatory Visit (HOSPITAL_COMMUNITY): Payer: Medicaid Other

## 2012-08-17 ENCOUNTER — Telehealth (HOSPITAL_COMMUNITY): Payer: Self-pay | Admitting: Emergency Medicine

## 2012-08-17 ENCOUNTER — Encounter (HOSPITAL_COMMUNITY): Payer: Self-pay | Admitting: Radiology

## 2012-08-17 DIAGNOSIS — F411 Generalized anxiety disorder: Secondary | ICD-10-CM

## 2012-08-17 DIAGNOSIS — M8448XA Pathological fracture, other site, initial encounter for fracture: Secondary | ICD-10-CM

## 2012-08-17 DIAGNOSIS — E43 Unspecified severe protein-calorie malnutrition: Secondary | ICD-10-CM | POA: Insufficient documentation

## 2012-08-17 DIAGNOSIS — C774 Secondary and unspecified malignant neoplasm of inguinal and lower limb lymph nodes: Secondary | ICD-10-CM

## 2012-08-17 HISTORY — PX: INGUINAL HERNIA REPAIR: SHX194

## 2012-08-17 SURGERY — REPAIR, HERNIA, INGUINAL, ADULT
Anesthesia: General | Laterality: Left

## 2012-08-17 MED ORDER — LACTATED RINGERS IV SOLN: INTRAVENOUS | Status: DC

## 2012-08-17 MED ORDER — ONDANSETRON HCL 4 MG/2ML IJ SOLN
INTRAMUSCULAR | Status: DC | PRN
  Administered 2012-08-17: 4 mg via INTRAVENOUS

## 2012-08-17 MED ORDER — DEXTROSE 5 % IV SOLN
1.0000 g | INTRAVENOUS | Status: DC
  Administered 2012-08-17: 1 g via INTRAVENOUS
  Filled 2012-08-17 (×2): qty 10

## 2012-08-17 MED ORDER — DEXAMETHASONE SODIUM PHOSPHATE 10 MG/ML IJ SOLN
INTRAMUSCULAR | Status: DC | PRN
  Administered 2012-08-17: 4 mg via INTRAVENOUS

## 2012-08-17 MED ORDER — BUPIVACAINE HCL (PF) 0.25 % IJ SOLN
INTRAMUSCULAR | Status: DC | PRN
  Administered 2012-08-17: 15 mL

## 2012-08-17 MED ORDER — LACTATED RINGERS IV SOLN
INTRAVENOUS | Status: DC | PRN
  Administered 2012-08-17: 09:00:00 via INTRAVENOUS

## 2012-08-17 MED ORDER — FENTANYL CITRATE 0.05 MG/ML IJ SOLN: 25.0000 ug | INTRAMUSCULAR | Status: DC | PRN

## 2012-08-17 MED ORDER — LIDOCAINE-EPINEPHRINE 1 %-1:100000 IJ SOLN
INTRAMUSCULAR | Status: AC
  Filled 2012-08-17: qty 1

## 2012-08-17 MED ORDER — MIDAZOLAM HCL 5 MG/5ML IJ SOLN
INTRAMUSCULAR | Status: DC | PRN
  Administered 2012-08-17: 2 mg via INTRAVENOUS

## 2012-08-17 MED ORDER — PROPOFOL 10 MG/ML IV BOLUS
INTRAVENOUS | Status: DC | PRN
  Administered 2012-08-17: 200 mg via INTRAVENOUS

## 2012-08-17 MED ORDER — MIRTAZAPINE 15 MG PO TABS
15.0000 mg | ORAL_TABLET | Freq: Every day | ORAL | Status: DC
  Administered 2012-08-17 – 2012-08-20 (×4): 15 mg via ORAL
  Filled 2012-08-17 (×5): qty 1

## 2012-08-17 MED ORDER — FENTANYL CITRATE 0.05 MG/ML IJ SOLN
INTRAMUSCULAR | Status: DC | PRN
  Administered 2012-08-17: 50 ug via INTRAVENOUS

## 2012-08-17 MED ORDER — LIDOCAINE HCL (PF) 2 % IJ SOLN
INTRAMUSCULAR | Status: DC | PRN
  Administered 2012-08-17: 75 mg

## 2012-08-17 MED ORDER — CLONAZEPAM 0.5 MG PO TABS
0.5000 mg | ORAL_TABLET | Freq: Three times a day (TID) | ORAL | Status: DC | PRN
  Administered 2012-08-17 – 2012-08-20 (×3): 0.5 mg via ORAL
  Filled 2012-08-17 (×3): qty 1

## 2012-08-17 MED ORDER — LIDOCAINE-EPINEPHRINE 1 %-1:100000 IJ SOLN
INTRAMUSCULAR | Status: DC | PRN
  Administered 2012-08-17: 15 mL via INTRADERMAL

## 2012-08-17 MED ORDER — BUPIVACAINE HCL (PF) 0.25 % IJ SOLN
INTRAMUSCULAR | Status: AC
  Filled 2012-08-17: qty 30

## 2012-08-17 SURGICAL SUPPLY — 38 items
ADH SKN CLS APL DERMABOND .7 (GAUZE/BANDAGES/DRESSINGS) ×1
APL SKNCLS STERI-STRIP NONHPOA (GAUZE/BANDAGES/DRESSINGS) ×1
APPLIER CLIP 11 MED OPEN (CLIP) ×4
APR CLP MED 11 20 MLT OPN (CLIP) ×2
BENZOIN TINCTURE PRP APPL 2/3 (GAUZE/BANDAGES/DRESSINGS) ×2 IMPLANT
BLADE SURG ROTATE 9660 (MISCELLANEOUS) ×2 IMPLANT
BLADE SURG SZ10 CARB STEEL (BLADE) IMPLANT
CHLORAPREP W/TINT 26ML (MISCELLANEOUS) ×2 IMPLANT
CLIP APPLIE 11 MED OPEN (CLIP) IMPLANT
CLOTH BEACON ORANGE TIMEOUT ST (SAFETY) ×2 IMPLANT
DECANTER SPIKE VIAL GLASS SM (MISCELLANEOUS) ×1 IMPLANT
DERMABOND ADVANCED (GAUZE/BANDAGES/DRESSINGS) ×1
DERMABOND ADVANCED .7 DNX12 (GAUZE/BANDAGES/DRESSINGS) ×1 IMPLANT
DISSECTOR ROUND CHERRY 3/8 STR (MISCELLANEOUS) IMPLANT
DRAIN PENROSE 18X1/2 LTX STRL (DRAIN) IMPLANT
DRAPE LAPAROTOMY TRNSV 102X78 (DRAPE) ×2 IMPLANT
DRAPE UTILITY XL STRL (DRAPES) ×2 IMPLANT
DRESSING TELFA 8X3 (GAUZE/BANDAGES/DRESSINGS) ×2 IMPLANT
DRSG TEGADERM 4X4.75 (GAUZE/BANDAGES/DRESSINGS) ×2 IMPLANT
ELECT CAUTERY BLADE 6.4 (BLADE) ×2 IMPLANT
ELECT REM PT RETURN 9FT ADLT (ELECTROSURGICAL) ×2
ELECTRODE REM PT RTRN 9FT ADLT (ELECTROSURGICAL) ×1 IMPLANT
GLOVE BIO SURGEON STRL SZ7.5 (GLOVE) ×2 IMPLANT
GLOVE SURG SS PI 7.5 STRL IVOR (GLOVE) ×4 IMPLANT
GOWN STRL NON-REIN LRG LVL3 (GOWN DISPOSABLE) ×2 IMPLANT
GOWN STRL REIN XL XLG (GOWN DISPOSABLE) ×4 IMPLANT
KIT BASIN OR (CUSTOM PROCEDURE TRAY) ×2 IMPLANT
NEEDLE HYPO 22GX1.5 SAFETY (NEEDLE) ×2 IMPLANT
PACK GENERAL/GYN (CUSTOM PROCEDURE TRAY) ×2 IMPLANT
STRIP CLOSURE SKIN 1/2X4 (GAUZE/BANDAGES/DRESSINGS) ×2 IMPLANT
SUT MNCRL AB 4-0 PS2 18 (SUTURE) ×2 IMPLANT
SUT PROLENE 2 0 BLUE (SUTURE) ×2 IMPLANT
SUT VIC AB 2-0 SH 27 (SUTURE) ×2
SUT VIC AB 2-0 SH 27X BRD (SUTURE) ×2 IMPLANT
SUT VIC AB 3-0 SH 27 (SUTURE) ×2
SUT VIC AB 3-0 SH 27XBRD (SUTURE) ×1 IMPLANT
SYR CONTROL 10ML LL (SYRINGE) ×2 IMPLANT
TOWEL OR 17X26 10 PK STRL BLUE (TOWEL DISPOSABLE) ×2 IMPLANT

## 2012-08-17 NOTE — Anesthesia Preprocedure Evaluation (Addendum)
Anesthesia Evaluation  Patient identified by MRN, date of birth, ID band Patient awake    Reviewed: Allergy & Precautions, H&P , NPO status , Patient's Chart, lab work & pertinent test results  Airway Mallampati: II TM Distance: >3 FB Neck ROM: full    Dental  (+) Edentulous Upper and Edentulous Lower   Pulmonary neg pulmonary ROS, shortness of breath and with exertion,  breath sounds clear to auscultation  Pulmonary exam normal       Cardiovascular Exercise Tolerance: Good negative cardio ROS  Rhythm:regular Rate:Normal     Neuro/Psych negative neurological ROS  negative psych ROS   GI/Hepatic negative GI ROS, Neg liver ROS, (+) Cirrhosis -       , Cirrhosis 10/13   Endo/Other  negative endocrine ROS  Renal/GU negative Renal ROS  negative genitourinary   Musculoskeletal   Abdominal   Peds  Hematology negative hematology ROS (+)   Anesthesia Other Findings Metastatic bladder cancer  Reproductive/Obstetrics negative OB ROS                          Anesthesia Physical Anesthesia Plan  ASA: III  Anesthesia Plan: General   Post-op Pain Management:    Induction: Intravenous  Airway Management Planned: LMA  Additional Equipment:   Intra-op Plan:   Post-operative Plan:   Informed Consent: I have reviewed the patients History and Physical, chart, labs and discussed the procedure including the risks, benefits and alternatives for the proposed anesthesia with the patient or authorized representative who has indicated his/her understanding and acceptance.   Dental Advisory Given  Plan Discussed with: CRNA and Surgeon  Anesthesia Plan Comments:        Anesthesia Quick Evaluation

## 2012-08-17 NOTE — Progress Notes (Signed)
Patient is fidgety, anxious, called Dr. Gustavo Lah office and I spoke with Alyson Locket RN, she asked DR. Ennever if we could resume Klonopin and Remeron, ok'd meds to administer.- Hulda Marin RN

## 2012-08-17 NOTE — Anesthesia Postprocedure Evaluation (Signed)
  Anesthesia Post-op Note  Patient: [name]  Procedure(s) Performed: Procedure(s) (LRB): Left inguinal lymph node excision (Left)  Patient Location: PACU  Anesthesia Type: General  Level of Consciousness: awake and alert   Airway and Oxygen Therapy: Patient Spontanous Breathing  Post-op Pain: mild  Post-op Assessment: Post-op Vital signs reviewed, Patient's Cardiovascular Status Stable, Respiratory Function Stable, Patent Airway and No signs of Nausea or vomiting  Last Vitals:  Filed Vitals:   08/17/12 1115  BP: 119/65  Pulse: 89  Temp:   Resp: 12    Post-op Vital Signs: stable   Complications: No apparent anesthesia complications

## 2012-08-17 NOTE — Progress Notes (Signed)
Day of Surgery  Subjective: No new issues  Objective: Vital signs in last 24 hours: Temp:  [97.5 F (36.4 C)-98.6 F (37 C)] 97.7 F (36.5 C) (06/05 0850) Pulse Rate:  [84-103] 84 (06/05 0850) Resp:  [16-18] 16 (06/05 0850) BP: (144-186)/(83-126) 186/126 mmHg (06/05 0850) SpO2:  [92 %-97 %] 92 % (06/05 0850) Weight:  [197 lb 1.5 oz (89.4 kg)] 197 lb 1.5 oz (89.4 kg) (06/05 0502) Last BM Date: 08/16/12  Intake/Output from previous day: 06/04 0701 - 06/05 0700 In: 360 [P.O.:360] Out: 1775 [Urine:1775] Intake/Output this shift:    General appearance: alert, cooperative and no distress Skin: left inguinal chain of nodes easily palpable.  site marked   Lab Results:   Recent Labs  08/14/12 1730 08/15/12 1421  WBC 14.4* 13.4*  HGB 15.0 14.0  HCT 45.2 43.0  PLT 379 421*   BMET  Recent Labs  08/14/12 1730 08/15/12 1421  NA 137  --   K 3.6  --   CL 100  --   CO2 29  --   GLUCOSE 112*  --   BUN 14  --   CREATININE 0.83 0.77  CALCIUM 9.6  --    PT/INR  Recent Labs  08/16/12 1327  LABPROT 17.9*  INR 1.52*   ABG No results found for this basename: PHART, PCO2, PO2, HCO3,  in the last 72 hours  Studies/Results: Mr Lumbar Spine W Wo Contrast  08/16/2012   *RADIOLOGY REPORT*  Clinical Data: Severe low back pain.  Bladder cancer.  MRI LUMBAR SPINE WITHOUT AND WITH CONTRAST  Technique:  Multiplanar and multiecho pulse sequences of the lumbar spine were obtained without and with intravenous contrast.  Contrast: 20mL MULTIHANCE GADOBENATE DIMEGLUMINE 529 MG/ML IV SOLN  Comparison: CT scan of the abdomen and pelvis dated 07/10/2012  Findings: The patient has extensive periaortic adenopathy as well as adenopathy along the iliac vessels in the pelvis on the right and left.  This is consistent with metastatic bladder cancer.  Normal conus tip at T11-12.  There is a 2.8 x 2.7 x 2.0 cm enhancing metastasis in the anterior aspect of the L4 vertebral body.  Almost the entire L5  vertebral body is infiltrated by tumor.  There is what is most likely a pathologic Schmorl's node in the inferior endplate of L5.  T11-12:  Severe left facet arthritis with left foraminal stenosis which could affect the left T11 nerve.  The disc is normal.  T12-L1:  Normal disc.  Bilateral facet arthritis with bilateral foraminal stenosis, right greater than left.  L1-2 and L2-3:  Normal.  L3-4:  Minimal disc bulge into the left neural foramen.  Bilateral facet hypertrophy.  No neural impingement.  L4-5:  Severe bilateral facet arthritis, right greater than left. The disc is normal.  Left foraminal stenosis.  L5-S1:  Severe bilateral facet arthritis.  Small annular tear and a tiny disc bulge at the level of the left neural foramen.  Left foraminal stenosis.  IMPRESSION:  1.  Extensive periaortic adenopathy and pelvic adenopathy consistent with metastatic bladder cancer. 2.  Metastatic lesions in L4 and L5 with a pathologic Schmorl's node in the inferior endplate of L5. 3.  Severe bilateral facet arthritis at L4-5 and L5-S1.   Original Report Authenticated By: Francene Boyers, M.D.    Anti-infectives: Anti-infectives   Start     Dose/Rate Route Frequency Ordered Stop   08/17/12 0600  ceFAZolin (ANCEF) IVPB 2 g/50 mL premix    Comments:  On  call to OR   2 g 100 mL/hr over 30 Minutes Intravenous On call to O.R. 08/16/12 1230 08/18/12 0559      Assessment/Plan: s/p Procedure(s): Left inguinal lymph node excision (Left) will plan for excisional bx today. site marked with the patient.  Risks of infection, bleeding, pain, scarring, lymphocele, inadequate tissue or negative exam, and nerve injury discussed with the patient and he desires to proceed with left inguinal lymph node biopsy  LOS: 2 days    Lodema Pilot DAVID 08/17/2012

## 2012-08-17 NOTE — Progress Notes (Signed)
Patient back to unit from OR, denies SOB, on o2 at 2L/m per [facility]. On Dilaudid drip, pain, level of 8. Has ice pack to L inguinal area, no bleeding noted.Hulda Marin RN.

## 2012-08-17 NOTE — Progress Notes (Addendum)
IR aware of request for coabaltion procedure. Having IR MD review imaging. Procedure will have to be done at [facility] as IR MD that does this procedure not available at this facility. Will look into schedule for availability.  Brayton El PA-C Interventional Radiology 08/17/2012 8:48 AM

## 2012-08-17 NOTE — Progress Notes (Signed)
PACU Nursing Noted: Pt arrived into PACU very sedated, but would open eyes when name called or to light tactile stimuli, Dilaudid gtt noted to be cont infusing per pre-op and post-op orders (infusing on Alaris Pump), gtt verified by two RN's. Pt's resp very irregular, deep at times followed with periods of apnea. Resp vary from 6/min to 18/min, POX readings also vary from 92-98% on 2lpm [place], ETCO2 assessed, remains less than , ETCO2 pattern consistent with respirations assessed. MDA aware. No further orders rec'd at this time. Pt appears comfortable, did verbalize having no pain.

## 2012-08-17 NOTE — Progress Notes (Signed)
I very much appreciate the help by Dr. Biagio Quint.  Looks like he might have surgery today. Again I very much appreciate the incredibly expeditious help.  I will give interventional radiology involved. I think a co-ablation kyphoplasty at L5 and possibly L4 can help with his back pain.  He is comfortable on the Dilaudid infusion at 2 mg an hour. His appetite is doing better. He feels like he did develop a more.   He's ambulating low but better.  [name] all his vital signs are stable. Blood pressure 144/83. Lungs are clear bilaterally. Cardiac exam regular rate rhythm with no murmurs rubs or bruits. Abdominal exam soft. Has good bowel sounds. There is no fluid wave. There is no palpable hepato splenomegaly. [name].  He is somewhat anxious. We do have him on some antianxiety medications.  I suspect that he will still need to be hospitalized until we get the kyphoplasty done. After his kyphoplasty, that I thought would consider radiation therapy to the lumbosacral spine region.  I cannot do a PET scan as an inpatient. I think we probably can wait to have this done as an outpatient.  Pete E.  Psalm 27:1

## 2012-08-17 NOTE — H&P (Signed)
. Referring Physician:Ennever HPI: [name] is an 57 y.o. male with hx of bladder cancer who now has evidence of metastatic disease. He had LN biopsy earlier today by surgery but is also found to have L4, L5 lesions on his MRI and is having back pain in that location. He is currently on a Dilaudid gtt and IR is asked to review for possible co-ablation procedure. Dr. Corliss King has reviewed images and feels pt is candidate for co-ablation procedure of L4 and L5 Chart reviewed, pt noted to have elevated WBC, possible from IV Decadron but also has UTI with + Urine Cx of Klebsiella. It does not appear he has been on Abx aside from the single dose he received today prior to his surgery. PMHx and meds reviewed  Past Medical History:  Past Medical History  Diagnosis Date  . Gross hematuria   . C. difficile enteritis 07/04/2011  . Diverticulitis   . Subcutaneous emphysema, postoperative 01/07/2012  . Cirrhosis 01/07/2012    Radiographically  . Splenomegaly   . Bladder cancer 01/07/2012    Transitional Cell Carcinoma, s/p bladder, prostate and lymph node resection with ileal conduit at [name] on 10/9, discharged on 10/14   . Acute pyelonephritis 01/07/2012  . Prostate cancer 08/11/2012    Past Surgical History:  Past Surgical History  Procedure Laterality Date  . Elbow surgery    . Transurethral resection of bladder tumor  10/26/2011    Procedure: TRANSURETHRAL RESECTION OF BLADDER TUMOR (TURBT);  Surgeon: Edwin Barban, MD;  Location: [facility];  Service: Urology;  Laterality: N/A;  . Transurethral resection of prostate  10/26/2011    Procedure: TRANSURETHRAL RESECTION OF THE PROSTATE (TURP);  Surgeon: Edwin Barban, MD;  Location: [facility];  Service: Urology;  Laterality: N/A;    Family History:  Family History  Problem Relation Age of Onset  . Heart failure Father   . Hypertension Mother     Social History:  reports that he quit smoking about 4 years ago. He has never used  smokeless tobacco. He reports that he does not drink alcohol or use illicit drugs.  Allergies: No Known Allergies  Medications: Medications Prior to Admission  Medication Sig Dispense Refill  . clonazePAM (KLONOPIN) 0.5 MG tablet Take 0.5 mg by mouth 3 (three) times daily as needed for anxiety.       Marland Kitchen HYDROmorphone (DILAUDID) 2 MG tablet Take 1 tablet (2 mg total) by mouth every 4 (four) hours as needed for pain.  30 tablet  0  . mirtazapine (REMERON) 15 MG tablet Take 15 mg by mouth at bedtime.      Marland Kitchen oxyCODONE (ROXICODONE) 15 MG immediate release tablet Take 15 mg by mouth every 4 (four) hours as needed for pain.        Please HPI for pertinent positives, otherwise complete 10 system ROS negative.  Physical Exam: BP 141/73  Pulse 90  Temp(Src) 98.6 F (37 C) (Oral)  Resp 12  Ht 5\' 11"  (1.803 m)  Wt 197 lb 1.5 oz (89.4 kg)  BMI 27.5 kg/m2  SpO2 93% Body mass index is 27.5 kg/(m^2).   General Appearance:  Alert, cooperative, no distress, appears stated age  Head:  Normocephalic, without obvious abnormality, atraumatic  ENT: Unremarkable  Neck: Supple, symmetrical, trachea midline  Lungs:   Clear to auscultation bilaterally, no w/r/r, respirations unlabored without use of accessory muscles.  Chest Wall:  No tenderness or deformity  Heart:  Regular rate and rhythm, S1, S2 normal,  no murmur, rub or gallop.  Abdomen:   Soft, non-tender, non distended, RLQ urostomy/ileal conduit  Neurologic: Normal affect, no gross deficits.   Results for orders placed during the King encounter of 08/15/12 (from the past 48 hour(s))  CBC     Status: Abnormal   Collection Time    08/15/12  2:21 PM      Result Value Range   WBC 13.4 (*) 4.0 - 10.5 K/uL   RBC 5.50  4.22 - 5.81 MIL/uL   Hemoglobin 14.0  13.0 - 17.0 g/dL   HCT 78.2  95.6 - 21.3 %   MCV 78.2  78.0 - 100.0 fL   MCH 25.5 (*) 26.0 - 34.0 pg   MCHC 32.6  30.0 - 36.0 g/dL   RDW 08.6  57.8 - 46.9 %   Platelets 421 (*) 150 - 400  K/uL  CREATININE, SERUM     Status: None   Collection Time    08/15/12  2:21 PM      Result Value Range   Creatinine, Ser 0.77  0.50 - 1.35 mg/dL   GFR calc non Af Amer >90  >90 mL/min   GFR calc Af Amer >90  >90 mL/min   Comment:            The eGFR has been calculated     using the CKD EPI equation.     This calculation has not been     validated in all clinical     situations.     eGFR's persistently     <90 mL/min signify     possible Chronic Kidney Disease.  PROTIME-INR     Status: Abnormal   Collection Time    08/16/12  1:27 PM      Result Value Range   Prothrombin Time 17.9 (*) 11.6 - 15.2 seconds   INR 1.52 (*) 0.00 - 1.49  SURGICAL PCR SCREEN     Status: None   Collection Time    08/17/12 12:05 AM      Result Value Range   MRSA, PCR NEGATIVE  NEGATIVE   Staphylococcus aureus NEGATIVE  NEGATIVE   Comment:            The Xpert SA Assay (FDA     approved for NASAL specimens     in patients over 31 years of age),     is one component of     a comprehensive surveillance     program.  Test performance has     been validated by [employer] for patients greater     than or equal to 44 year old.     It is not intended     to diagnose infection nor to     guide or monitor treatment.   Mr [name] W Wo Contrast  08/16/2012   *RADIOLOGY REPORT*  Clinical Data: Severe low back pain.  Bladder cancer.  MRI [name] WITHOUT AND WITH CONTRAST  Technique:  Multiplanar and multiecho pulse sequences of the [name] were obtained without and with intravenous contrast.  Contrast: 20mL MULTIHANCE GADOBENATE DIMEGLUMINE 529 MG/ML IV SOLN  Comparison: CT scan of the abdomen and pelvis dated 07/10/2012  Findings: The patient has extensive periaortic adenopathy as well as adenopathy along the iliac vessels in the pelvis on the right and left.  This is consistent with metastatic bladder cancer.  Normal conus tip at T11-12.  There is a 2.8 x 2.7 x 2.0 cm  enhancing metastasis in  the anterior aspect of the L4 vertebral body.  Almost the entire L5 vertebral body is infiltrated by tumor.  There is what is most likely a pathologic Schmorl's node in the inferior endplate of L5.  T11-12:  Severe left facet arthritis with left foraminal stenosis which could affect the left T11 nerve.  The disc is normal.  T12-L1:  Normal disc.  Bilateral facet arthritis with bilateral foraminal stenosis, right greater than left.  L1-2 and L2-3:  Normal.  L3-4:  Minimal disc bulge into the left neural foramen.  Bilateral facet hypertrophy.  No neural impingement.  L4-5:  Severe bilateral facet arthritis, right greater than left. The disc is normal.  Left foraminal stenosis.  L5-S1:  Severe bilateral facet arthritis.  Small annular tear and a tiny disc bulge at the level of the left neural foramen.  Left foraminal stenosis.  IMPRESSION:  1.  Extensive periaortic adenopathy and pelvic adenopathy consistent with metastatic bladder cancer. 2.  Metastatic lesions in L4 and L5 with a pathologic Schmorl's node in the inferior endplate of L5. 3.  Severe bilateral facet arthritis at L4-5 and L5-S1.   Original Report Authenticated By: Francene Boyers, M.D.    Assessment/Plan Metastatic lesions in L4 and L5 with a pathologic Schmorl's  node in the inferior endplate of L5 Hx of bladder cancer UTI-Klebsiella  Discussed co-ablation procedure with pt and family. Earliest procedure could be done at [facility] with Dr. Corliss King on Mon 6/9. He is on schedule at this point for about 1100. Procedure risks, complications, use of sedation, and post op expectations thoroughly discussed. In the meantime, UTI needs to be treated and repeat UA on Sun 6/8  Elevated WBC possible from UTI vs. Decadron, will follow. Last INR 1.52, will recheck prior to procedure. Lovenox will need to be held 24 hrs prior to procedure as well. Will follow along through weekend and place formal orders to proceed Mon.  Brayton El PA-C 08/17/2012, 2:12  PM

## 2012-08-17 NOTE — Transfer of Care (Signed)
Immediate Anesthesia Transfer of Care Note  Patient: [name] [facility]  Procedure(s) Performed: Procedure(s): Left inguinal lymph node excision (Left)  Patient Location: PACU  Anesthesia Type:General  Level of Consciousness: awake and sedated  Airway & Oxygen Therapy: Patient Spontanous Breathing and Patient connected to face mask oxygen  Post-op Assessment: Report given to PACU RN and Post -op Vital signs reviewed and stable  Post vital signs: Reviewed and stable  Complications: No apparent anesthesia complications

## 2012-08-17 NOTE — ED Notes (Signed)
Post ED Visit - Positive Culture Follow-up  Culture report reviewed by antimicrobial stewardship pharmacist: []  Wes Dulaney, Pharm.D., BCPS [x]  Celedonio Miyamoto, Pharm.D., BCPS []  Georgina Pillion, Pharm.D., BCPS []  [place], [address].D., BCPS, AAHIVP []  Estella Husk, Pharm.D., BCPS, AAHIVP  Positive urine culture Patient admitted to [facility].  Kylie A Holland 08/17/2012, 12:20 PM

## 2012-08-17 NOTE — Brief Op Note (Signed)
08/15/2012 - 08/17/2012  10:55 AM  PATIENT:  [name]  57 y.o. male  PRE-OPERATIVE DIAGNOSIS:  metastatic cancer  POST-OPERATIVE DIAGNOSIS:  metastatic cancer  PROCEDURE:  Procedure(s): Left inguinal lymph node excision (Left)  SURGEON:  Surgeon(s) and Role:    * Lodema Pilot, DO - Primary  PHYSICIAN ASSISTANT:   ASSISTANTS: none   ANESTHESIA:   general  EBL:     BLOOD ADMINISTERED:none  DRAINS: none   LOCAL MEDICATIONS USED:  MARCAINE    and LIDOCAINE   SPECIMEN:  Source of Specimen:  left inguinal lymph node biopsy  DISPOSITION OF SPECIMEN:  PATHOLOGY  COUNTS:  YES  TOURNIQUET:  * No tourniquets in log *  DICTATION: .Other Dictation: Dictation Number [id number]  PLAN OF CARE: Admit to inpatient   PATIENT DISPOSITION:  PACU - hemodynamically stable.   Delay start of Pharmacological VTE agent (>24hrs) due to surgical blood loss or risk of bleeding: no

## 2012-08-18 ENCOUNTER — Encounter (HOSPITAL_COMMUNITY): Payer: Self-pay | Admitting: General Surgery

## 2012-08-18 ENCOUNTER — Encounter (HOSPITAL_COMMUNITY): Payer: Medicaid Other

## 2012-08-18 MED ORDER — FENTANYL 25 MCG/HR TD PT72
25.0000 ug | MEDICATED_PATCH | TRANSDERMAL | Status: DC
  Administered 2012-08-18 – 2012-08-24 (×3): 25 ug via TRANSDERMAL
  Filled 2012-08-18 (×3): qty 1

## 2012-08-18 MED ORDER — DEXTROSE 5 % IV SOLN
2.0000 g | INTRAVENOUS | Status: DC
  Administered 2012-08-18 – 2012-08-25 (×8): 2 g via INTRAVENOUS
  Filled 2012-08-18 (×8): qty 2

## 2012-08-18 MED ORDER — SODIUM CHLORIDE 0.9 % IV SOLN
1.0000 mg/h | INTRAVENOUS | Status: DC
  Administered 2012-08-19 – 2012-08-20 (×2): 1 mg/h via INTRAVENOUS
  Filled 2012-08-18 (×3): qty 5

## 2012-08-18 NOTE — Progress Notes (Signed)
[name] had [name] left inguinal lymph node biopsy yesterday. [name] graciously thank surgery for doing this so quickly.  [name] was seen by radiology. [name] will likely have kyphoplasty next week.  [name] pain control still good. [name] is on Dilaudid drip at 2 mg an hour. [name] am going to start him on a Duragesic patch. [name]. [name].  [name] appetite continues to do well. [name] has no nausea vomiting. [name]. [name] is on Rocephin.  [name] white cell count elevation is probably from [name] Decadron. [name] have him on some Decadron to try to help with [name] bony pain.  [name], vital signs are temperature 90.7 a pulse 111 respiratory 16 blood pressure 154/83. [name]. [name]. [name]. Lungs are clear bilaterally. [name]. [name] has no murmurs rubs or bruits. Abdominal exam soft. [name] has good bowel sounds. [name] urostomy is intact. [name]. [name] incision site in the left angle region is healing.  Hopefully, pathology will be out today from the inguinal node biopsy.  [name]. This will help quite a bit.  [name]'ll also need to have a Port-A-Cath placed while in the hospital to 8 and administering systemic chemotherapy which is what [name] will need.  [name] also will need radiation therapy for the lumbosacral spine.  [name].  [name] appreciate a great care that [name] is getting from the nurses and staff on 3 E.  Hewitt Shorts  Proverbs 17:27

## 2012-08-18 NOTE — Progress Notes (Signed)
1 Day Post-Op  Subjective: Talking on the phone, no real complaints  Objective: Vital signs in last 24 hours: Temp:  [97.8 F (36.6 C)-98.6 F (37 C)] 97.8 F (36.6 C) (06/06 0444) Pulse Rate:  [89-111] 111 (06/06 0444) Resp:  [8-19] 16 (06/06 0444) BP: (113-154)/(60-83) 154/83 mmHg (06/06 0444) SpO2:  [93 %-99 %] 94 % (06/06 0444) Last BM Date: 08/17/12 480 PO recorded, BP is up some, afebrile. No labs  PE:  Biopsy site looks good. Intake/Output from previous day: 06/05 0701 - 06/06 0700 In: 1408 [P.O.:480; I.V.:928] Out: 2375 [Urine:2375] Intake/Output this shift:      Lab Results:   Recent Labs  08/15/12 1421  WBC 13.4*  HGB 14.0  HCT 43.0  PLT 421*    BMET  Recent Labs  08/15/12 1421  CREATININE 0.77   PT/INR  Recent Labs  08/16/12 1327  LABPROT 17.9*  INR 1.52*     Recent Labs Lab 08/11/12 1408  AST 34  ALKPHOS 114*  BILITOT 0.60  PROT 8.5*     Lipase     Component Value Date/Time   LIPASE 34 04/16/2012 1333     Studies/Results: No results found.  Medications: . cefTRIAXone (ROCEPHIN)  IV  2 g Intravenous Q24H  . docusate sodium  100 mg Oral BID  . enoxaparin (LOVENOX) injection  40 mg Subcutaneous QHS  . fentaNYL  25 mcg Transdermal Q72H  . mirtazapine  15 mg Oral QHS  . pantoprazole  40 mg Oral Daily    Assessment/Plan History of bladder cancer, history of prostate cancer, and lymphadenopathy.  S/p Excisional biopsy of left inguinal lymph node. 08/17/2012, Lodema Pilot, MD  L4-5 Metastasis with significant pain. For IR ablation Rx next week. Emphysema, HX of prior tobacco use. Cirrhosis  UTI  PlaN:  Will follow as needed path pending. .    LOS: 3 days    Margi Edmundson 08/18/2012

## 2012-08-18 NOTE — Op Note (Signed)
NAMESTONY, [name] NO.:  [id number]  MEDICAL RECORD NO.:  [id number]  LOCATION:  1314                         FACILITY:  [facility]  PHYSICIAN:  Lodema Pilot, MD       DATE OF BIRTH:  1955/11/09  DATE OF PROCEDURE:  08/17/2012 DATE OF DISCHARGE:                              OPERATIVE REPORT   PROCEDURE:  Excisional biopsy of left inguinal lymph node.  PREOPERATIVE DIAGNOSIS:  History of bladder cancer, history of prostate cancer, and lymphadenopathy.  FLUIDS:  500 mL of crystalloid.  ESTIMATED BLOOD LOSS:  Minimal.  DRAINS:  None.  SPECIMENS:  4 cm x 3 cm left inguinal lymph node sent fresh to Pathology for analysis and.  COMPLICATIONS:  None apparent.  FINDINGS:  A 4 cm x 3 cm left inguinal lymph node was removed and sent to pathology fresh for analysis.  INDICATION FOR PROCEDURE:  Mr. Burtch is a 57 year old male with a history of bladder cancer and prostate cancer.  Imaging concerning for metastatic disease.  He has a stable lymphadenopathy and we were asked for to perform excisional biopsy for definitive diagnosis to guide further treatment.  OPERATIVE DETAILS:  Mr. Schleicher was seen and evaluated in the preoperative area.  Risks and benefits of the procedure were again discussed in lay terms.  Informed consent was obtained.  Surgical site was marked prior to anesthetic administration.  He was given prophylactic antibiotics taken to the operating room, placed on the table in supine position and general anesthesia was obtained.  Left abdomen and groin were prepped and draped in a standard surgical fashion.  Procedure time-out was performed with all operative team members to confirm proper patient, procedure, and then an oblique incision was made over the palpable mass in the left groin, keeping the incision out in the groin crease to minimize infection.  Dissection was carried down through subcutaneous tissue using Bovie electrocautery. The mass  was easily palpable.  The mass was dissected circumferentially using multiple hemoclips on any lymphovascular structure.  I dissected this circumferentially and the proximal end was completely freed up and was able to elevate this and then again using sequential hemoclips was completely undermined the mass staying close to the edge of the mass, to minimize the chance of injury to surrounding structures.  The mass was removed and measured 4 cm x 3 cm and placed a saline moistened Telfa and sent to Pathology for fresh analysis.  The wound was then inspected for hemostasis, which was noted be adequate.  I irrigated this with sterile saline solution and injected the wound with 30 mL of 1% lidocaine with epinephrine and 0.25% Marcaine in a 50:50 mixture.  The Scarpa's fascia was approximated with 3-0 Vicryl running suture and the skin edges were approximated with 4-0 Monocryl subcuticular suture.  Skin was washed and dried and Dermabond was applied.  All sponge, needle, and instrument counts were correct at end of the case.  The patient tolerated the procedure well without apparent complications.          ______________________________ Lodema Pilot, MD     BL/MEDQ  D:  08/17/2012  T:  08/18/2012  Job:  [id number]

## 2012-08-18 NOTE — Progress Notes (Signed)
Not much incisional pain.  Wound looks great, no swelling or sign of infection.  Ambulatory.  No wound care needed for this.  Awaiting pathology

## 2012-08-19 LAB — COMPREHENSIVE METABOLIC PANEL
AST: 66 U/L — ABNORMAL HIGH (ref 0–37)
Albumin: 2.6 g/dL — ABNORMAL LOW (ref 3.5–5.2)
BUN: 18 mg/dL (ref 6–23)
Calcium: 8.7 mg/dL (ref 8.4–10.5)
Chloride: 104 mEq/L (ref 96–112)
Creatinine, Ser: 0.72 mg/dL (ref 0.50–1.35)
GFR calc non Af Amer: >90 mL/min (ref >90)
Total Bilirubin: 0.7 mg/dL (ref 0.3–1.2)

## 2012-08-19 LAB — CBC
HCT: 44.1 % (ref 39.0–52.0)
MCH: 26.4 pg (ref 26.0–34.0)
MCHC: 32.9 g/dL (ref 30.0–36.0)
MCV: 80.3 fL (ref 78.0–100.0)
Platelets: 300 10*3/uL (ref 150–400)
RDW: 15.4 % (ref 11.5–15.5)
WBC: 15.6 10*3/uL — ABNORMAL HIGH (ref 4.0–10.5)

## 2012-08-19 NOTE — Progress Notes (Signed)
AF.  Wound has some erythema but looks like bruising.  No evidence of hematoma or lymphocele.  Will check this again to follow for infection.  Path returned malignant.  Please let us know if mediport will be needed.

## 2012-08-20 DIAGNOSIS — C679 Malignant neoplasm of bladder, unspecified: Principal | ICD-10-CM

## 2012-08-20 DIAGNOSIS — G893 Neoplasm related pain (acute) (chronic): Secondary | ICD-10-CM

## 2012-08-20 LAB — URINALYSIS, ROUTINE W REFLEX MICROSCOPIC
Bilirubin Urine: NEGATIVE
Nitrite: NEGATIVE
Protein, ur: NEGATIVE mg/dL
Urobilinogen, UA: 0.2 mg/dL (ref 0.0–1.0)

## 2012-08-20 LAB — URINE MICROSCOPIC-ADD ON

## 2012-08-20 LAB — APTT: aPTT: 30 seconds (ref 24–37)

## 2012-08-20 LAB — PROTIME-INR
INR: 1.18 (ref 0.00–1.49)
Prothrombin Time: 14.8 seconds (ref 11.6–15.2)

## 2012-08-20 NOTE — Progress Notes (Signed)
Remains AF.  He says that the redness is better.  The wound has some bruising on the cephalad aspect but he still has some erythema medial which could be cellulitis or still postop changes or bruising.  He is on ABX already for other issues which should cover any skin infections.  Will just continue to monitor the wound.

## 2012-08-20 NOTE — Progress Notes (Signed)
  08/20/2012, 10:52 AM  Hospital day: 6 Antibiotics: rocephin Chemotherapy: none  Patient seen on call for Dr Myna Hidalgo. Mother, sister and brother here.  Subjective: Pain improved over admission, slept a little, able to eat this AM. Bowels moved x 1 yesterday, somewhat loose. No SOB. Area around inguinal LN biopsy not very uncomfortable, reportedly erythema about same in that area, no drainage.  Note from Dr Biagio Quint this AM seen, with surgery following the wound.  For kyphoplasty procedure by IR at [facility] tomorrow.  Objective: Vital signs in last 24 hours: Blood pressure 132/75, pulse 110, temperature 97.5 F (36.4 C), temperature source Oral, resp. rate 16, height 5\' 11"  (1.803 m), weight 197 lb 1.5 oz (89.4 kg), SpO2 97.00%.  Alert, talkative, looks generally comfortable supine in bed on RA.  IV at 50 Intake/Output from previous day: 06/07 0701 - 06/08 0700 In: 980 [P.O.:480; I.V.:450; IV Piggyback:50] Out: 2540 [Urine:2540] Intake/Output this shift:    Physical exam: PERRL. Respirations not labored. Mouth clear and moist. Peripheral IV LUE. Lungs without wheezes or rales. Heart RRR. Abdomen soft, nontender, few BS normally active. No pedal edema or cords/ tenderness calves. Right groin area with erythema above and medial to incision, and some slight erythema also medial upper thigh, no heat, no drainage, no fluctuance, not tender to gentle exam. Moves all extremities easily.  Lab Results:  Recent Labs  08/19/12 0735  WBC 15.6*  HGB 14.5  HCT 44.1  PLT 300   BMET  Recent Labs  08/19/12 0735  NA 140  K 4.9  CL 104  CO2 27  GLUCOSE 85  BUN 18  CREATININE 0.72  CALCIUM 8.7   MRSA nasal swabs negative  Urine cx from 08-14-12 with Klebsiella sensitive to ceftriazone and cefazolin  Studies/Results: Path from inguinal node noted, however I did not discuss this today.  Assessment/Plan: 1.Metastatic bladder cancer: admitted for pain control as workup in progress. Pain  improved now on duragesic 25 begun 08-18-12 and prn dilaudid (none used since 08-19-12 AM) For kyphoplasty tomorrow; may need RT consult also. 2. Hx T2aNo transitional cell ca of bladder  3. hxT2cN0 adenocarcinoma of prostate: PSA <0.01 08-11-12 4. 30 pack year smoking DCd 2009  Patient is appreciative of care.  LIVESAY,LENNIS P

## 2012-08-20 NOTE — Care Management Note (Signed)
    Page 1 of 2   08/20/2012     2:50:34 PM   CARE MANAGEMENT NOTE 08/20/2012  Patient:  [name], [name]   Account Number:  [id number]  Date Initiated:  08/16/2012  Documentation initiated by:  DAVIS,TYMEEKA  Subjective/Objective Assessment:   57 yo male admitted with uncontrollable pain related to malignancy. PTA pt from home with spouse. Pt currently active with [facility].     Action/Plan:   home when stable   Anticipated DC Date:     Anticipated DC Plan:  HOME W HOME HEALTH SERVICES      DC Planning Services  CM consult      Choice offered to / List presented to:  NA   DME arranged  NA      DME agency  NA     HH arranged  HH-1 RN      [facility] agency  [facility]   Status of service:  In process, will continue to follow Medicare Important Message given?   (If response is "NO", the following Medicare IM given date fields will be blank) Date Medicare IM given:   Date Additional Medicare IM given:    Discharge Disposition:    Per UR Regulation:  Reviewed for med. necessity/level of care/duration of stay  If discussed at Long Length of Stay Meetings, dates discussed:    Comments:  08/20/12 Devontre Siedschlag RN,BSN NCM WEEKEND [phone] RECEIVED CALL FROM NURSE INFORMING ME OF KYPHOPLASTY IN AM @ [facility].INFORMED NURSE THAT [facility] TEL#[phone] WOULD NEED TO BE CONTACTED FOR TRANSPORTATION (ACUTE TO ACUTE TRANSFER) AFTERWE HAVE A RECEIVING MD,FACILITY, & NURSE TO NURSE REPORT GIVEN.  08/16/12 1600 Tymeeka Davis,RN,BSN [phone] Cm spoke with patient at length cocnerning discharge planning. Per pt currently residing at home with spouse. Pt states receiving services from [facility] for ostomy. Pt states using no DMe for home use.

## 2012-08-21 LAB — CBC
HCT: 39.3 % (ref 39.0–52.0)
Platelets: 254 10*3/uL (ref 150–400)
RDW: 15.3 % (ref 11.5–15.5)
WBC: 15.9 10*3/uL — ABNORMAL HIGH (ref 4.0–10.5)

## 2012-08-21 MED ORDER — CLONAZEPAM 1 MG PO TABS
1.0000 mg | ORAL_TABLET | Freq: Three times a day (TID) | ORAL | Status: DC | PRN
  Administered 2012-08-21 – 2012-08-25 (×5): 1 mg via ORAL
  Filled 2012-08-21 (×5): qty 1

## 2012-08-21 MED ORDER — MIRTAZAPINE 30 MG PO TABS
30.0000 mg | ORAL_TABLET | Freq: Every day | ORAL | Status: DC
  Administered 2012-08-21 – 2012-08-24 (×4): 30 mg via ORAL
  Filled 2012-08-21 (×5): qty 1

## 2012-08-21 NOTE — Progress Notes (Signed)
Patient ID: [name], male   DOB: Dec 21, 1955, 57 y.o.   MRN: [id number] 4 Days Post-Op  Subjective: No complaints, no pain in groin, denies fevers, no drainage  Objective: Vital signs in last 24 hours: Temp:  [97.9 F (36.6 C)-98.9 F (37.2 C)] 98.6 F (37 C) (06/09 0555) Pulse Rate:  [90-112] 94 (06/09 0555) Resp:  [16-20] 20 (06/09 0555) BP: (128-148)/(79-93) 128/80 mmHg (06/09 0555) SpO2:  [93 %-96 %] 95 % (06/09 0555) Last BM Date: 08/18/12  Intake/Output from previous day: 06/08 0701 - 06/09 0700 In: 1870 [P.O.:720; I.V.:1150] Out: 1850 [Urine:1850] Intake/Output this shift:   Physical exam: General: awake, NAD Ext: left groin incision c/d/i, no knots/hard areas, there is surrounding redness but it appears to be more inflammatory then infectious. No pain.   Lab Results:   Recent Labs  08/19/12 0735 08/21/12 0405  WBC 15.6* 15.9*  HGB 14.5 13.1  HCT 44.1 39.3  PLT 300 254    BMET  Recent Labs  08/19/12 0735  NA 140  K 4.9  CL 104  CO2 27  GLUCOSE 85  BUN 18  CREATININE 0.72  CALCIUM 8.7   PT/INR  Recent Labs  08/20/12 0712  LABPROT 14.8  INR 1.18     Recent Labs Lab 08/19/12 0735  AST 66*  ALT 64*  ALKPHOS 128*  BILITOT 0.7  PROT 6.7  ALBUMIN 2.6*     Lipase     Component Value Date/Time   LIPASE 34 04/16/2012 1333     Studies/Results: No results found.  Medications: . cefTRIAXone (ROCEPHIN)  IV  2 g Intravenous Q24H  . docusate sodium  100 mg Oral BID  . enoxaparin (LOVENOX) injection  40 mg Subcutaneous QHS  . fentaNYL  25 mcg Transdermal Q72H  . mirtazapine  30 mg Oral QHS  . pantoprazole  40 mg Oral Daily    Assessment/Plan History of bladder cancer, history of prostate cancer, and lymphadenopathy.   S/p Excisional biopsy of left inguinal lymph node. 08/17/2012, Lodema Pilot, MD.  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 For IR  ablation Rx next week. Emphysema, HX of prior tobacco use. Cirrhosis  UTI  Plan: Redness seems inflammatory, no other signs of infection in the area, already on abx for UTI, monitor closely, will evidentially need a port-a-cath but will need to be infection free prior to this.     LOS: 6 days    WHITE, ELIZABETH 08/21/2012  Agree with above.  Ovidio Kin, MD, [facility] Surgery Pager: [phone] Office phone:  [phone]

## 2012-08-21 NOTE — Progress Notes (Signed)
Dr. Corliss Skains has reviewed labs today WBC still 15.9, has been off Decadron for several days. UA still shows WBCs and rare bacteria, aware of urostomy.  Per Dr. Corliss Skains, he would like to hold off on procedure 1 more day due to risk of infection. Continue Rocephin and recheck CBC, UA in am. If labs stable or improved, can do procedure tomorrow.

## 2012-08-21 NOTE — Progress Notes (Signed)
Patient aware that he is NPO.

## 2012-08-21 NOTE — Progress Notes (Signed)
[name] did okay over the weekend. He has a Duragesic patch on. The Dilaudid drip is down to 1 mg an hour. I'll see about discontinuing this. His pain seems to be under good control. He still worries a lot about having pain.  He goes for his kyphoplasty today. I think this will help quite a bit with respect to his pain. Will then consider radiation therapy to the lumbar spine.  The left inguinal lymph node biopsy will hopefully be back today.  I will also consider putting a Port-A-Cath in the him. I think this will be essential for him to treat him with chemotherapy and also for any supplemental therapies that we need.  His appetite is doing all right. He feels he is a little constipated.  He is ambulating. He is having no cough.  His vital signs are stable. Blood pressure 128/80. Temperature 98.6. Lungs are clear bilaterally. Cardiac exam regular rate and rhythm with a normal S1 and S2. There are no murmurs rubs or bruits. Abdominal exam is soft. He has good bowel sounds. There is no fluid wave. There is no palpable hepatospleno megaly. [name]. Neurological exam shows no focal neurological deficits.   Labs show a white cell count of 15.9-13.1 hematocrit 39.3 platelet count 254.  Again, he goes for his kyphoplasty today.  We'll continue the Rocephin for the Klebsiella in his urine. I've his urinalysis reflects this urine from a urostomy and not active infection.  We likely will still need to keep him in the hospital for another 3 or 4 days.  Edwin E.

## 2012-08-22 ENCOUNTER — Ambulatory Visit (HOSPITAL_COMMUNITY)
Admission: AD | Admit: 2012-08-22 | Discharge: 2012-08-22 | Disposition: A | Discharge status: Home / Self Care | Payer: Medicaid Other | Source: Ambulatory Visit | Attending: Radiology | Admitting: Radiology

## 2012-08-22 DIAGNOSIS — D709 Neutropenia, unspecified: Secondary | ICD-10-CM

## 2012-08-22 DIAGNOSIS — C774 Secondary and unspecified malignant neoplasm of inguinal and lower limb lymph nodes: Secondary | ICD-10-CM

## 2012-08-22 LAB — URINE MICROSCOPIC-ADD ON

## 2012-08-22 LAB — CBC WITH DIFFERENTIAL/PLATELET
Basophils Absolute: 0 10*3/uL (ref 0.0–0.1)
Basophils Relative: 0 % (ref 0–1)
MCHC: 32.5 g/dL (ref 30.0–36.0)
Neutro Abs: 9.3 10*3/uL — ABNORMAL HIGH (ref 1.7–7.7)
Neutrophils Relative %: 77 % (ref 43–77)
Platelets: 303 10*3/uL (ref 150–400)
RDW: 15.4 % (ref 11.5–15.5)

## 2012-08-22 LAB — CREATININE, SERUM
Creatinine, Ser: 0.76 mg/dL (ref 0.50–1.35)
GFR calc Af Amer: >90 mL/min (ref >90)
GFR calc non Af Amer: >90 mL/min (ref >90)

## 2012-08-22 LAB — URINALYSIS, ROUTINE W REFLEX MICROSCOPIC
Nitrite: POSITIVE — AB
Specific Gravity, Urine: 1.015 (ref 1.005–1.030)
Urobilinogen, UA: 0.2 mg/dL (ref 0.0–1.0)

## 2012-08-22 MED ORDER — MIDAZOLAM HCL 2 MG/2ML IJ SOLN
INTRAMUSCULAR | Status: AC | PRN
  Administered 2012-08-22 (×5): 1 mg via INTRAVENOUS

## 2012-08-22 MED ORDER — FENTANYL CITRATE 0.05 MG/ML IJ SOLN
INTRAMUSCULAR | Status: AC | PRN
  Administered 2012-08-22 (×5): 25 ug via INTRAVENOUS

## 2012-08-22 MED ORDER — HYDROMORPHONE HCL PF 1 MG/ML IJ SOLN
INTRAMUSCULAR | Status: AC | PRN
  Administered 2012-08-22: 2 mg

## 2012-08-22 MED ORDER — SODIUM CHLORIDE 0.9 % IV SOLN: INTRAVENOUS | Status: AC

## 2012-08-22 NOTE — Progress Notes (Signed)
General Surgery Note  LOS: 7 days  POD -  5 Days Post-Op   Assessment/Plan: 1.  Left inguinal lymph node excision -  08/17/2012 - Lodema Pilot, MD.  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  In good spirits.  2.  History of bladder cancer, history of prostate cancer, and lymphadenopathy.   Surgery at [facility] by Dr. Suan Halter   3.  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   For kyphoplasty today 4.  Emphysema, HX of prior tobacco use. 5.  Cirrhosis 5.  UTI - Klebsiella pneumoniae - > 100,000  On Rocephin. 6.  DVT prophylaxis - On lovenox 7.  Discussed power port placement.  Will plan later this week.  I discussed indications and complications of the procedure.  Risks include, but are not limited to, bleeding, infection, and pneumothorax.  Subjective:  Doing okay.  No complaint. Objective:   Filed Vitals:   08/22/12 0501  BP: 153/77  Pulse: 87  Temp: 98.5 F (36.9 C)  Resp: 16     Intake/Output from previous day:  06/09 0701 - 06/10 0700 In: 1200 [I.V.:1200] Out: 1726 [Urine:1725; Stool:1]  Intake/Output this shift:      Physical Exam:   General: WN older WM who is alert and oriented.    HEENT: Normal. Pupils equal. .   Lungs: Clear.   Abdomen: Soft.  Urostomy RLQ.   Wound: Left groin wound with some swelling.  No inflammation.     Lab Results:    Recent Labs  08/21/12 0405 08/22/12 0403  WBC 15.9* 12.1*  HGB 13.1 12.4*  HCT 39.3 38.1*  PLT 254 303    BMET   Recent Labs  08/22/12 0403  CREATININE 0.76    PT/INR   Recent Labs  08/20/12 0712  LABPROT 14.8  INR 1.18    ABG  No results found for this basename: PHART, PCO2, PO2, HCO3,  in the last 72 hours   Studies/Results:  No results found.   Anti-infectives:   Anti-infectives   Start     Dose/Rate Route Frequency Ordered Stop   08/18/12 0800  cefTRIAXone (ROCEPHIN) 2 g in dextrose 5 % 50 mL IVPB     2 g 100 mL/hr over 30 Minutes  Intravenous Every 24 hours 08/18/12 0644     08/17/12 1600  cefTRIAXone (ROCEPHIN) 1 g in dextrose 5 % 50 mL IVPB  Status:  Discontinued    Comments:  For UTI   1 g 100 mL/hr over 30 Minutes Intravenous Every 24 hours 08/17/12 1423 08/18/12 0653   08/17/12 0600  ceFAZolin (ANCEF) IVPB 2 g/50 mL premix    Comments:  On call to OR   2 g 100 mL/hr over 30 Minutes Intravenous On call to O.R. 08/16/12 1230 08/17/12 1007      Ovidio Kin, MD, FACS Pager: [phone],   [facility] Surgery Office: [phone] 08/22/2012

## 2012-08-22 NOTE — Procedures (Signed)
S/P L4 and L5 radfrequency ablation followed by vertebral body augmentation.

## 2012-08-22 NOTE — Progress Notes (Signed)
Patient is aware he is npo tonight.

## 2012-08-22 NOTE — Progress Notes (Signed)
Hopefully, the kyphoplasty will be done today. There was some concern regarding the urine. His white cell count is coming down. Again I suspect some of the urine abnormalities could be from his urostomy. The Klebsiella is very sensitive to Rocephin. His been on Rocephin now for for 5 days. He is afebrile.   The biopsy does show metastatic bladder cancer. As such, we will consider a systemic chemotherapy for this. I do believe, however, that he likely will need some radiation therapy to the lumbar spine and initially. We will get this arranged once he has the kyphoplasty done.  His pain seems to be doing fairly well. He continues on a Duragesic patch. He is off his Dilaudid drip now.  His appetite is okay. There's no nausea vomiting.  Apparently has been some erythema associated with the left inguinal lymph node biopsy. Dr. Ezzard Standing of surgery is monitoring this for [place].  His vital signs all stable. Blood pressure 153/77. Lungs are clear bilaterally. Cardiac exam regular rate and rhythm with no murmurs rubs or bruits. Abdomen is soft. He has no fluid wave. There is no palpable hepatospleno megaly. [name].  His labs look good. White cell count 12.1. Hemoglobin 12.4. And platelet count 303.  Hopefully, the kyphoplasty will be today. We will then need to arrange for a Port-A-Cath to be placed while he is an inpatient. We will continue to monitor him for pain issues.  We should be able to get him home before the weekend.  Pete E.

## 2012-08-23 ENCOUNTER — Inpatient Hospital Stay (HOSPITAL_COMMUNITY): Payer: Medicaid Other

## 2012-08-23 ENCOUNTER — Encounter (HOSPITAL_COMMUNITY): Payer: Self-pay | Admitting: Anesthesiology

## 2012-08-23 ENCOUNTER — Inpatient Hospital Stay (HOSPITAL_COMMUNITY): Payer: Medicaid Other | Admitting: Anesthesiology

## 2012-08-23 ENCOUNTER — Encounter (HOSPITAL_COMMUNITY)
Admission: AD | Disposition: A | Discharge status: Home / Self Care | Payer: Self-pay | Source: Ambulatory Visit | Attending: Hematology & Oncology

## 2012-08-23 HISTORY — PX: PORTACATH PLACEMENT: SHX2246

## 2012-08-23 SURGERY — INSERTION, TUNNELED CENTRAL VENOUS DEVICE, WITH PORT
Anesthesia: General | Site: Chest | Wound class: Clean

## 2012-08-23 MED ORDER — LACTATED RINGERS IV SOLN
INTRAVENOUS | Status: DC | PRN
  Administered 2012-08-23: 10:00:00 via INTRAVENOUS

## 2012-08-23 MED ORDER — SODIUM CHLORIDE 0.9 % IR SOLN
Status: DC | PRN
  Administered 2012-08-23: 1000 mL

## 2012-08-23 MED ORDER — LACTATED RINGERS IV SOLN
INTRAVENOUS | Status: DC
  Administered 2012-08-23: 1000 mL via INTRAVENOUS

## 2012-08-23 MED ORDER — HEPARIN SOD (PORK) LOCK FLUSH 100 UNIT/ML IV SOLN
INTRAVENOUS | Status: DC | PRN
  Administered 2012-08-23: 500 [IU] via INTRAVENOUS

## 2012-08-23 MED ORDER — PROMETHAZINE HCL 25 MG/ML IJ SOLN: 6.2500 mg | INTRAMUSCULAR | Status: DC | PRN

## 2012-08-23 MED ORDER — FENTANYL CITRATE 0.05 MG/ML IJ SOLN
INTRAMUSCULAR | Status: DC | PRN
  Administered 2012-08-23 (×2): 50 ug via INTRAVENOUS

## 2012-08-23 MED ORDER — MIDAZOLAM HCL 5 MG/5ML IJ SOLN
INTRAMUSCULAR | Status: DC | PRN
  Administered 2012-08-23: 2 mg via INTRAVENOUS

## 2012-08-23 MED ORDER — FENTANYL CITRATE 0.05 MG/ML IJ SOLN: 25.0000 ug | INTRAMUSCULAR | Status: DC | PRN

## 2012-08-23 MED ORDER — ADULT MULTIVITAMIN W/MINERALS CH
1.0000 | ORAL_TABLET | Freq: Every day | ORAL | Status: DC
  Administered 2012-08-24 – 2012-08-25 (×2): 1 via ORAL
  Filled 2012-08-23 (×2): qty 1

## 2012-08-23 MED ORDER — SODIUM CHLORIDE 0.9 % IR SOLN
Freq: Once | Status: AC
  Administered 2012-08-23: 12:00:00
  Filled 2012-08-23: qty 1.2

## 2012-08-23 MED ORDER — BUPIVACAINE HCL 0.25 % IJ SOLN
INTRAMUSCULAR | Status: DC | PRN
  Administered 2012-08-23: 7 mL

## 2012-08-23 MED ORDER — LIDOCAINE HCL 1 % IJ SOLN
INTRAMUSCULAR | Status: DC | PRN
  Administered 2012-08-23: 100 mg via INTRADERMAL

## 2012-08-23 MED FILL — Tobramycin Sulfate For Inj 1.2 GM: INTRAMUSCULAR | Qty: 1 | Status: AC

## 2012-08-23 SURGICAL SUPPLY — 41 items
APL SKNCLS STERI-STRIP NONHPOA (GAUZE/BANDAGES/DRESSINGS) ×1
BAG DECANTER FOR FLEXI CONT (MISCELLANEOUS) ×2 IMPLANT
BENZOIN TINCTURE PRP APPL 2/3 (GAUZE/BANDAGES/DRESSINGS) ×2 IMPLANT
BLADE SURG SZ10 CARB STEEL (BLADE) ×2 IMPLANT
CHLORAPREP W/TINT 10.5 ML (MISCELLANEOUS) ×2 IMPLANT
CLOTH BEACON ORANGE TIMEOUT ST (SAFETY) ×2 IMPLANT
DECANTER SPIKE VIAL GLASS SM (MISCELLANEOUS) ×2 IMPLANT
DRAPE C-ARM 42X120 X-RAY (DRAPES) ×2 IMPLANT
DRAPE LAPAROTOMY TRNSV 102X78 (DRAPE) ×2 IMPLANT
DRSG TEGADERM 4X4.75 (GAUZE/BANDAGES/DRESSINGS) ×1 IMPLANT
ELECT REM PT RETURN 9FT ADLT (ELECTROSURGICAL) ×2
ELECTRODE REM PT RTRN 9FT ADLT (ELECTROSURGICAL) ×1 IMPLANT
GAUZE SPONGE 2X2 8PLY STRL LF (GAUZE/BANDAGES/DRESSINGS) ×1 IMPLANT
GAUZE SPONGE 4X4 16PLY XRAY LF (GAUZE/BANDAGES/DRESSINGS) ×2 IMPLANT
GLOVE BIOGEL PI IND STRL 7.0 (GLOVE) ×1 IMPLANT
GLOVE BIOGEL PI INDICATOR 7.0 (GLOVE) ×1
GLOVE SURG SIGNA 7.5 PF LTX (GLOVE) ×2 IMPLANT
GOWN STRL NON-REIN LRG LVL3 (GOWN DISPOSABLE) ×2 IMPLANT
GOWN STRL REIN XL XLG (GOWN DISPOSABLE) ×4 IMPLANT
IV CATHETER PAT DEVICE CLC2000 (IV SETS) IMPLANT
KIT BASIN OR (CUSTOM PROCEDURE TRAY) ×2 IMPLANT
KIT PORT POWER 8FR ISP CVUE (Catheter) ×1 IMPLANT
KIT POWER CATH 8FR (Catheter) IMPLANT
MARKER SKIN DUAL TIP RULER LAB (MISCELLANEOUS) ×2 IMPLANT
NDL HYPO 25X1 1.5 SAFETY (NEEDLE) ×1 IMPLANT
NEEDLE HYPO 22GX1.5 SAFETY (NEEDLE) ×2 IMPLANT
NEEDLE HYPO 25X1 1.5 SAFETY (NEEDLE) ×2 IMPLANT
NS IRRIG 1000ML POUR BTL (IV SOLUTION) ×2 IMPLANT
PACK BASIC VI WITH GOWN DISP (CUSTOM PROCEDURE TRAY) ×2 IMPLANT
PENCIL BUTTON HOLSTER BLD 10FT (ELECTRODE) ×2 IMPLANT
SPONGE GAUZE 2X2 STER 10/PKG (GAUZE/BANDAGES/DRESSINGS) ×1
SPONGE GAUZE 4X4 12PLY (GAUZE/BANDAGES/DRESSINGS) ×4 IMPLANT
STRIP CLOSURE SKIN 1/2X4 (GAUZE/BANDAGES/DRESSINGS) ×1 IMPLANT
STRIP CLOSURE SKIN 1/4X4 (GAUZE/BANDAGES/DRESSINGS) ×2 IMPLANT
SUT VIC AB 3-0 SH 18 (SUTURE) ×2 IMPLANT
SUT VIC AB 5-0 PS2 18 (SUTURE) ×2 IMPLANT
SYR BULB IRRIGATION 50ML (SYRINGE) ×2 IMPLANT
SYR CONTROL 10ML LL (SYRINGE) ×2 IMPLANT
SYRINGE 10CC LL (SYRINGE) ×2 IMPLANT
TOWEL OR 17X26 10 PK STRL BLUE (TOWEL DISPOSABLE) ×2 IMPLANT
WATER STERILE IRR 1500ML POUR (IV SOLUTION) ×2 IMPLANT

## 2012-08-23 NOTE — Progress Notes (Signed)
General Surgery Note  LOS: 8 days  POD -  6 Days Post-Op  Assessment/Plan: 1.  Left inguinal lymph node excision -  08/17/2012 - Lodema Pilot, MD.  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  In good spirits.  2.  History of bladder cancer, history of prostate cancer, and lymphadenopathy.   Surgery at [facility] by Dr. Suan Halter   3.  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   Did well with kyphoplasty - 08/22/2012  Some back soreness, but does not appear bad. 4.  Emphysema, HX of prior tobacco use. 5.  Cirrhosis 5.  UTI - Klebsiella pneumoniae - > 100,000  On Rocephin. 6.  DVT prophylaxis - On lovenox (held for surgery) 7.  Power port placement.  I discussed indications and complications of the procedure.  Risks include, but are not limited to, bleeding, infection, and pneumothorax.  For placement later today.  Subjective:  Doing okay.  No complaint. Objective:   Filed Vitals:   08/23/12 0400  BP: 148/87  Pulse: 73  Temp: 97.9 F (36.6 C)  Resp: 18     Intake/Output from previous day:  06/10 0701 - 06/11 0700 In: 120 [P.O.:120] Out: 1850 [Urine:1850]  Intake/Output this shift:      Physical Exam:   General: WN older WM who is alert and oriented.    HEENT: Normal. Pupils equal. .   Lungs: Clear.   Abdomen: Soft.  Urostomy RLQ.   Wound: Left groin wound with some swelling.  No inflammation.     Lab Results:     Recent Labs  08/21/12 0405 08/22/12 0403  WBC 15.9* 12.1*  HGB 13.1 12.4*  HCT 39.3 38.1*  PLT 254 303    BMET    Recent Labs  08/22/12 0403  CREATININE 0.76    PT/INR  No results found for this basename: LABPROT, INR,  in the last 72 hours  ABG  No results found for this basename: PHART, PCO2, PO2, HCO3,  in the last 72 hours   Studies/Results:  No results found.   Anti-infectives:   Anti-infectives   Start     Dose/Rate Route Frequency Ordered Stop   08/18/12 0800  cefTRIAXone  (ROCEPHIN) 2 g in dextrose 5 % 50 mL IVPB     2 g 100 mL/hr over 30 Minutes Intravenous Every 24 hours 08/18/12 0644     08/17/12 1600  cefTRIAXone (ROCEPHIN) 1 g in dextrose 5 % 50 mL IVPB  Status:  Discontinued    Comments:  For UTI   1 g 100 mL/hr over 30 Minutes Intravenous Every 24 hours 08/17/12 1423 08/18/12 0653   08/17/12 0600  ceFAZolin (ANCEF) IVPB 2 g/50 mL premix    Comments:  On call to OR   2 g 100 mL/hr over 30 Minutes Intravenous On call to O.R. 08/16/12 1230 08/17/12 1007      Ovidio Kin, MD, FACS Pager: [phone],   [facility] Surgery Office: [phone] 08/23/2012

## 2012-08-23 NOTE — Op Note (Signed)
08/15/2012 - 08/23/2012  12:23 PM  PATIENT:  [name], 57 y.o., male MRN: [id number] DOB: 1956-01-01  PREOP DIAGNOSIS:  poorly differentiated squamous cell ca (high grade urothelial type carcinoma), anticipate chemotherapy  POSTOP DIAGNOSIS:   poorly differentiated squamous cell ca (high grade urothelial type carcinoma), anticipate chemotherapy  PROCEDURE:   Procedure(s):  Clear Vue Power port, INSERTION PORT-A-CATH  SURGEON:   Ovidio Kin, M.D.  ASSISTANT:   None  ANESTHESIA:   general  Anesthesiologist: Eilene Ghazi, MD CRNA: Donn Pierini, CRNA  Choice  EBL:  Minimal  ml  COUNTS CORRECT:  YES  INDICATIONS FOR PROCEDURE:  [name] is a 57 y.o. 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  Dr. Ross Marcus is her treating oncologist.   The indications and risks of the surgery were explained to the patient.  The risks include, but are not limited to, infection, bleeding, pneumothorax, nerve injury, and thrombosis of the vein.  OPERATIVE NOTE:  The patient was taken to Room #1 at [facility].  Anesthesia was provided by Anesthesiologist: Eilene Ghazi, MD, and CRNA: Donn Pierini, CRNA.  The patient was already on Rocephin, he had a roll placed under her back, and had the upper chest/neck prepped with Chloroprep and draped.   A time out was held and the surgery checklist reviewed.   The patient was placed in Trendelenburg position.  The left subclavian vein was accessed with a 16 gauge needle and a guide wire threaded through the needle into the vein.  The position of the wire was checked with fluoroscopy.   I then developed a pocket in the upper inner aspect of the left chest for the port reservoir.  I used the [employer] for venous access.  The reservoir was sewn in place  with a 3-0 Vicryl suture.  The reservoir had been flushed with dilute (10 units/cc) heparin.   I then passed the silastic tubing from the reservoir incision to the subclavian stick site and used the 8 French introducer to pass it into the vein.  The tip of the silastic catheter was position at the junction of the SVC and the right atrium under fluoroscopy.  The silastic catheter was then attached to the port with the bayonet device.     The entire port and tubing were checked with fluoroscopy and then the port was flushed with 4 cc of concentrated heparin (100 units/cc).   The wounds were then closed with 3-0 vicryl subcutaneous sutures and the skin closed with a 5-0 Vicryl suture.  The skin was painted with tincture of benzoin and steri-stripped.   The patient was transferred to the recovery room in good condition.  The sponge and needle count were correct at the end of the case.  A CXR is ordered for port placement and pending at the time of this note.  Ovidio Kin, MD, [facility] Surgery Pager: [phone] Office phone:  [phone]

## 2012-08-23 NOTE — Progress Notes (Signed)
Dr Ezzard Standing called with orders to make patient NPO after midnight for [facility] placement in am. MD stated to Hold lovenox tonight. Will obtain consent for procedure.

## 2012-08-23 NOTE — Progress Notes (Signed)
Subjective: Pt just returned from OR. Had port placed by surgery team today. Still very sleepy at this time. Was not able to arouse to examine. Family states he did not c/o much back pain earlier today.  Objective: Physical Exam: BP 128/69  Pulse 77  Temp(Src) 97.8 F (36.6 C) (Oral)  Resp 16  Ht 5\' 11"  (1.803 m)  Wt 197 lb 1.5 oz (89.4 kg)  BMI 27.5 kg/m2  SpO2 100% Sleeping, NAD   Labs: CBC  Recent Labs  08/21/12 0405 08/22/12 0403  WBC 15.9* 12.1*  HGB 13.1 12.4*  HCT 39.3 38.1*  PLT 254 303   BMET  Recent Labs  08/22/12 0403  CREATININE 0.76   LFT No results found for this basename: PROT, ALBUMIN, AST, ALT, ALKPHOS, BILITOT, BILIDIR, IBILI, LIPASE,  in the last 72 hours PT/INR No results found for this basename: LABPROT, INR,  in the last 72 hours   Studies/Results: Dg Chest 1 View  08/23/2012   *RADIOLOGY REPORT*  Clinical Data: Post Port-A-Cath placement  CHEST - 1 VIEW  Comparison: 08/14/2012  Findings: Left subclavian power port with tip projecting over cavoatrial junction. Upper normal heart size. Stable mediastinal contours and pulmonary vascularity for technique. Minimal right basilar atelectasis. No infiltrate, pleural effusion, or pneumothorax. Bones unremarkable.  IMPRESSION: No pneumothorax following left subclavian Port-A-Cath insertion.   Original Report Authenticated By: Ulyses Southward, M.D.    Assessment/Plan: S/p L4 and L5 co-ablation procedure. Will recheck tomorrow am when more alert. Can be OOB/walking with RW.    LOS: 8 days    Brayton El PA-C 08/23/2012 2:27 PM

## 2012-08-23 NOTE — Preoperative (Signed)
Beta Blockers   Reason not to administer Beta Blockers:Not Applicable 

## 2012-08-23 NOTE — Anesthesia Preprocedure Evaluation (Signed)
Anesthesia Evaluation  Patient identified by MRN, date of birth, ID band Patient awake  General Assessment Comment:Bladder cancer 01/07/2012 Transitional Cell Carcinoma, s/p bladder, prostate and lymph node resection with ileal conduit at [facility] on 10/9, discharged on 10/14    Reviewed: Allergy & Precautions, H&P , NPO status , Patient's Chart, lab work & pertinent test results  Airway Mallampati: III TM Distance: >3 FB Neck ROM: Full    Dental no notable dental hx.    Pulmonary neg pulmonary ROS,  breath sounds clear to auscultation  Pulmonary exam normal       Cardiovascular negative cardio ROS  Rhythm:Regular Rate:Normal     Neuro/Psych negative neurological ROS  negative psych ROS   GI/Hepatic negative GI ROS, (+) Cirrhosis -       ,   Endo/Other  negative endocrine ROS  Renal/GU negative Renal ROS  negative genitourinary   Musculoskeletal negative musculoskeletal ROS (+)   Abdominal   Peds negative pediatric ROS (+)  Hematology  (+) Blood dyscrasia, anemia ,   Anesthesia Other Findings   Reproductive/Obstetrics negative OB ROS                           Anesthesia Physical Anesthesia Plan  ASA: III  Anesthesia Plan: General   Post-op Pain Management:    Induction: Intravenous  Airway Management Planned: LMA  Additional Equipment:   Intra-op Plan:   Post-operative Plan:   Informed Consent: I have reviewed the patients History and Physical, chart, labs and discussed the procedure including the risks, benefits and alternatives for the proposed anesthesia with the patient or authorized representative who has indicated his/her understanding and acceptance.   Dental advisory given  Plan Discussed with: CRNA and Surgeon  Anesthesia Plan Comments:         Anesthesia Quick Evaluation

## 2012-08-23 NOTE — Progress Notes (Signed)
Dr. Okey Dupre in to see patient- O.K. To go to floor

## 2012-08-23 NOTE — Progress Notes (Signed)
Portable chest x-ray results noted.

## 2012-08-23 NOTE — Anesthesia Postprocedure Evaluation (Signed)
  Anesthesia Post-op Note  Patient: [name]  Procedure(s) Performed: Procedure(s) (LRB): INSERTION PORT-A-CATH (N/A)  Patient Location: PACU  Anesthesia Type: General  Level of Consciousness: awake and alert   Airway and Oxygen Therapy: Patient Spontanous Breathing  Post-op Pain: mild  Post-op Assessment: Post-op Vital signs reviewed, Patient's Cardiovascular Status Stable, Respiratory Function Stable, Patent Airway and No signs of Nausea or vomiting  Last Vitals:  Filed Vitals:   08/23/12 0400  BP: 148/87  Pulse: 73  Temp: 36.6 C  Resp: 18    Post-op Vital Signs: stable   Complications: No apparent anesthesia complications

## 2012-08-23 NOTE — Transfer of Care (Signed)
Immediate Anesthesia Transfer of Care Note  Patient: [name]  Procedure(s) Performed: Procedure(s): INSERTION PORT-A-CATH (N/A)  Patient Location: PACU  Anesthesia Type:General  Level of Consciousness: awake and sedated  Airway & Oxygen Therapy: Patient Spontanous Breathing and Patient connected to face mask oxygen  Post-op Assessment: Report given to PACU RN and Post -op Vital signs reviewed and stable  Post vital signs: Reviewed and stable  Complications: No apparent anesthesia complications

## 2012-08-23 NOTE — Progress Notes (Signed)
Portable upright chest x-ray done. 

## 2012-08-23 NOTE — Progress Notes (Signed)
NUTRITION FOLLOW UP  Intervention:   Encourage PO intake Provide Multivitamin with minerals daily  Nutrition Dx:   Inadequate oral intake related to poor appetite as evidenced by pt statement; ongoing/improving   Goal:   Pt to consume >90% of meals; not met   Monitor:   Weight; 6 lb wt loss from 6/4 to 6/5; possibly related to edema Labs; low hemoglobin Intake; 75%-100% most meals per nursing notes  Assessment:   6/4: Pt with stage 2 bladder CA and stage 2 prostate CA with urostomy. Met with pt who reports for the past 2-3 weeks he has had stomach issues - which include ongoing nausea and vomiting after every meal. Pt reports the only thing he could keep down was cereal. Pt reports 12 pound unintended weight loss in the past week r/t poor appetite and inability to eat. Oncologist plans to start pt on steroids to help with appetite and back pain. Pt denies any nausea or diarrhea since admission. Pt dislikes nutritional supplements. Discussed high calorie/protein foods he could add to his diet. Also talked about foods/drinks that can help with diarrhea.   6/11: Pt in OR at time of first visit. At second visit pt was fast asleep, pt had just been given Versed per RN. Pt's wife in the room reports that pt was eating very well yesterday, eating as much as he normally does. Per chart, pt lost an additional 6 lbs from 6/2 to 6/5; ? fluid loss. Height: Ht Readings from Last 1 Encounters:  08/15/12 5\' 11"  (1.803 m)    Weight Status:   Wt Readings from Last 1 Encounters:  08/17/12 197 lb 1.5 oz (89.4 kg)    Re-estimated needs:  Kcal: 1850-2100  Protein: 95-110g  Fluid: 1.8-2.1L/day  Skin: generalized edema; two incisions on lower back, incision on left groin, and incision on chest  Diet Order: NPO   Intake/Output Summary (Last 24 hours) at 08/23/12 1456 Last data filed at 08/23/12 1345  Gross per 24 hour  Intake   2504 ml  Output   1650 ml  Net    854 ml    Last BM:  6/9   Labs:   Recent Labs Lab 08/19/12 0735 08/22/12 0403  NA 140  --   K 4.9  --   CL 104  --   CO2 27  --   BUN 18  --   CREATININE 0.72 0.76  CALCIUM 8.7  --   GLUCOSE 85  --     CBG (last 3)  No results found for this basename: GLUCAP,  in the last 72 hours  Scheduled Meds: . cefTRIAXone (ROCEPHIN)  IV  2 g Intravenous Q24H  . docusate sodium  100 mg Oral BID  . enoxaparin (LOVENOX) injection  40 mg Subcutaneous QHS  . fentaNYL  25 mcg Transdermal Q72H  . mirtazapine  30 mg Oral QHS  . pantoprazole  40 mg Oral Daily    Continuous Infusions: . sodium chloride Stopped (08/23/12 1003)    Ian Malkin RD, LDN Inpatient Clinical Dietitian Pager: [phone] After Hours Pager: [phone]

## 2012-08-23 NOTE — Progress Notes (Signed)
[name] is doing okay this morning. He underwent successful kyphoplasty at L4 and L5. He is a little sore this morning. I appreciate radiology's assistance with taking care of this issue. I'm sure that his back pain will be much improved.  We will now consider radiation therapy. I will arrange this to be done as an outpatient.  The lymph node biopsy does show recurrent bladder cancer. As such, he will need systemic chemotherapy. We can do this after radiation is completed.  Dr. Ezzard Standing will place a Port-A-Cath. It sounds like this will be today.  His appetite is okay. There's no nausea vomiting. Pap overall, his pain is doing better. He is on a Duragesic patch with break through hydromorphone.  His vital signs still are stable. He is afebrile. Blood pressure 148/87. His lungs are clear bilaterally. Cardiac exam regular and rhythm. Abdomen is soft. [name].  I think we're getting close to being able to discharge him. Once the Port-A-Cath is placed, we will watch him for one day and then plan to discharge.  We will do the PET scan as an outpatient. I will set radiation therapy as an outpatient.  Edwin E.

## 2012-08-24 ENCOUNTER — Encounter (HOSPITAL_COMMUNITY): Payer: Self-pay | Admitting: Surgery

## 2012-08-24 LAB — COMPREHENSIVE METABOLIC PANEL
Albumin: 2.2 g/dL — ABNORMAL LOW (ref 3.5–5.2)
BUN: 11 mg/dL (ref 6–23)
Calcium: 8.4 mg/dL (ref 8.4–10.5)
Chloride: 101 mEq/L (ref 96–112)
Creatinine, Ser: 0.83 mg/dL (ref 0.50–1.35)
Total Bilirubin: 0.4 mg/dL (ref 0.3–1.2)
Total Protein: 6.1 g/dL (ref 6.0–8.3)

## 2012-08-24 LAB — CBC
HCT: 38.1 % — ABNORMAL LOW (ref 39.0–52.0)
MCH: 24.9 pg — ABNORMAL LOW (ref 26.0–34.0)
MCHC: 31.5 g/dL (ref 30.0–36.0)
MCV: 79 fL (ref 78.0–100.0)
Platelets: 287 10*3/uL (ref 150–400)
RDW: 15.5 % (ref 11.5–15.5)

## 2012-08-24 MED ORDER — HYDROMORPHONE HCL 4 MG PO TABS
4.0000 mg | ORAL_TABLET | ORAL | Status: DC | PRN
  Administered 2012-08-24 – 2012-08-25 (×4): 4 mg via ORAL
  Filled 2012-08-24 (×4): qty 1

## 2012-08-24 MED FILL — Heparin Sodium (Porcine) Lock Flush IV Soln 100 Unit/ML: INTRAVENOUS | Qty: 10 | Status: AC

## 2012-08-24 NOTE — Progress Notes (Signed)
1 Day Post-Op  Subjective: Pt has been ambulatory this am; still having bilateral hip/upper thigh/low back soreness; no new neurological changes.  Objective: Vital signs in last 24 hours: Temp:  [97.3 F (36.3 C)-99 F (37.2 C)] 97.3 F (36.3 C) (06/12 0440) Pulse Rate:  [76-105] 95 (06/12 0440) Resp:  [11-18] 16 (06/12 0440) BP: (119-134)/(62-72) 121/67 mmHg (06/12 0440) SpO2:  [93 %-100 %] 98 % (06/12 0440) Last BM Date: 08/21/12  Intake/Output from previous day: 06/11 0701 - 06/12 0700 In: 2307.5 [P.O.:480; I.V.:1827.5] Out: 1325 [Urine:1325] Intake/Output this shift:    Puncture sites L4-5 region clean and dry, ?mild edema at site; sl- mod tenderness to palpation, primarily on the right; no ecchymoses or active bleeding; pathology from L4/5 sites negative for malignancy; left PAC site looks ok  Lab Results:   Recent Labs  08/22/12 0403 08/24/12 0440  WBC 12.1* 11.5*  HGB 12.4* 12.0*  HCT 38.1* 38.1*  PLT 303 287   BMET  Recent Labs  08/22/12 0403 08/24/12 0440  NA  --  137  K  --  3.6  CL  --  101  CO2  --  29  GLUCOSE  --  90  BUN  --  11  CREATININE 0.76 0.83  CALCIUM  --  8.4   PT/INR No results found for this basename: LABPROT, INR,  in the last 72 hours ABG No results found for this basename: PHART, PCO2, PO2, HCO3,  in the last 72 hours  Studies/Results: Dg Chest 1 View  08/23/2012   *RADIOLOGY REPORT*  Clinical Data: Post Port-A-Cath placement  CHEST - 1 VIEW  Comparison: 08/14/2012  Findings: Left subclavian power port with tip projecting over cavoatrial junction. Upper normal heart size. Stable mediastinal contours and pulmonary vascularity for technique. Minimal right basilar atelectasis. No infiltrate, pleural effusion, or pneumothorax. Bones unremarkable.  IMPRESSION: No pneumothorax following left subclavian Port-A-Cath insertion.   Original Report Authenticated By: Ulyses Southward, M.D.   Dg C-arm 1-60 Min-no Report  08/23/2012   CLINICAL  DATA: insertion of porta cath   C-ARM 1-60 MINUTES  Fluoroscopy was utilized by the requesting physician.  No radiographic  interpretation.     Anti-infectives: Anti-infectives   Start     Dose/Rate Route Frequency Ordered Stop   08/18/12 0800  cefTRIAXone (ROCEPHIN) 2 g in dextrose 5 % 50 mL IVPB     2 g 100 mL/hr over 30 Minutes Intravenous Every 24 hours 08/18/12 0644     08/17/12 1600  cefTRIAXone (ROCEPHIN) 1 g in dextrose 5 % 50 mL IVPB  Status:  Discontinued    Comments:  For UTI   1 g 100 mL/hr over 30 Minutes Intravenous Every 24 hours 08/17/12 1423 08/18/12 0653   08/17/12 0600  ceFAZolin (ANCEF) IVPB 2 g/50 mL premix    Comments:  On call to OR   2 g 100 mL/hr over 30 Minutes Intravenous On call to O.R. 08/16/12 1230 08/17/12 1007      Assessment/Plan: s/p L4/L5 RFA/VP 6/10; ice pack to lower back region; OOB; f/u with Dr. Corliss Skains as OP in 2 weeks (call [phone] with questions)   LOS: 9 days    ALLRED,D [facility] 08/24/2012

## 2012-08-24 NOTE — Progress Notes (Signed)
[name] had the port placed yesterday.  A little sore.  Some hip pain bilat, but this is more from inactivity.  Is OOB more. He is eating okay. There's no nausea or vomiting. He did take some stool softeners for some constipation.  He's had no cough. He still has a lot of anxiety. I talked a lot today about his cancer. I told him that we could treat this but that we're not going to cure this. Once the cancer gets into the bones, we just cannot get rid of it. Our goal is to improve his quality of life. Make sure that we take care of pain issues.  He continues on Rocephin for the Klebsiella his urine. I will finish this up today.  All his vital signs looked good. Temperature 97.3. Blood pressure 121/67. Lungs are clear. Cardiac exam regular rate and rhythm. Abdomen is soft. His urostomy is functioning well. [name].  His labs today looked okay.  I think that we will be oh to discharge him tomorrow. I want to make sure that he does okay today if this is a 30 Rocephin. I want to make sure is more ambulatory.  He will continue the Duragesic patch.  Hewitt Shorts

## 2012-08-24 NOTE — Discharge Instructions (Signed)
Allow steri-strips to fall off on their own in 7 days, may remove in 7 days if have not come off. May shower. No tub baths No lifting more then 10 pounds until cleared at follow up appointment with Dr. Biagio Quint

## 2012-08-24 NOTE — Progress Notes (Signed)
Patient ID: [name], male   DOB: [birth year], 57 y.o.   MRN: [id number] General Surgery Note  LOS: 9 days  POD -  1 Day Post-Op  Assessment/Plan: 1.  Left inguinal lymph node excision -  08/17/2012 - Lodema Pilot, MD.  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  In good spirits.  Discharge instructions written  2.  History of bladder cancer, history of prostate cancer, and lymphadenopathy.   Surgery at [facility] by Dr. Suan Halter   3.  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   Did well with kyphoplasty - 08/22/2012  Some back soreness, but does not appear bad. 4.  Emphysema, HX of prior tobacco use. 5.  Cirrhosis 5.  UTI - Klebsiella pneumoniae - > 100,000  On Rocephin. 6.  DVT prophylaxis - On lovenox (held for surgery) 7.  Power port placement, 08/24/12  Wound ok  Discharge instructions in chart  Follow up with Dr. Biagio Quint scheduled    Subjective:  Doing good.  No complaints regarding surgeries, lots of questions regarding his cancer treatment and diagnosis. Objective:   Filed Vitals:   08/24/12 0440  BP: 121/67  Pulse: 95  Temp: 97.3 F (36.3 C)  Resp: 16     Intake/Output from previous day:  06/11 0701 - 06/12 0700 In: 2307.5 [P.O.:480; I.V.:1827.5] Out: 1325 [Urine:1325]  Intake/Output this shift:      Physical Exam:   General: NAD, alert and oriented.    Lungs: Clear.   Abdomen: Soft.  Urostomy RLQ.   Wound: Left groin wound with min swelling.  No inflammation.   Chest: left port site incisions ok, steri-strips in place     Lab Results:     Recent Labs  08/22/12 0403 08/24/12 0440  WBC 12.1* 11.5*  HGB 12.4* 12.0*  HCT 38.1* 38.1*  PLT 303 287    BMET    Recent Labs  08/22/12 0403 08/24/12 0440  NA  --  137  K  --  3.6  CL  --  101  CO2  --  29  GLUCOSE  --  90  BUN  --  11  CREATININE 0.76 0.83  CALCIUM  --  8.4    PT/INR  No results found for this basename: LABPROT, INR,  in  the last 72 hours  ABG  No results found for this basename: PHART, PCO2, PO2, HCO3,  in the last 72 hours   Studies/Results:  Dg Chest 1 View  08/23/2012   *RADIOLOGY REPORT*  Clinical Data: Post Port-A-Cath placement  CHEST - 1 VIEW  Comparison: 08/14/2012  Findings: Left subclavian power port with tip projecting over cavoatrial junction. Upper normal heart size. Stable mediastinal contours and pulmonary vascularity for technique. Minimal right basilar atelectasis. No infiltrate, pleural effusion, or pneumothorax. Bones unremarkable.  IMPRESSION: No pneumothorax following left subclavian Port-A-Cath insertion.   Original Report Authenticated By: Ulyses Southward, M.D.   Dg C-arm 1-60 Min-no Report  08/23/2012   CLINICAL DATA: insertion of porta cath   C-ARM 1-60 MINUTES  Fluoroscopy was utilized by the requesting physician.  No radiographic  interpretation.      Anti-infectives:   Anti-infectives   Start     Dose/Rate Route Frequency Ordered Stop   08/18/12 0800  cefTRIAXone (ROCEPHIN) 2 g in dextrose 5 % 50 mL IVPB     2 g 100 mL/hr over 30 Minutes Intravenous Every 24 hours 08/18/12 0644     08/17/12 1600  cefTRIAXone (ROCEPHIN) 1 g in dextrose 5 % 50 mL IVPB  Status:  Discontinued    Comments:  For UTI   1 g 100 mL/hr over 30 Minutes Intravenous Every 24 hours 08/17/12 1423 08/18/12 0653   08/17/12 0600  ceFAZolin (ANCEF) IVPB 2 g/50 mL premix    Comments:  On call to OR   2 g 100 mL/hr over 30 Minutes Intravenous On call to O.R. 08/16/12 1230 08/17/12 1007     WHITE, [facility] Surgery Office: [phone] 08/24/2012  Agree with above.  Ovidio Kin, MD, [facility] Surgery Pager: [phone] Office phone:  [phone]

## 2012-08-25 ENCOUNTER — Telehealth: Payer: Self-pay | Admitting: Dietician

## 2012-08-25 DIAGNOSIS — C7951 Secondary malignant neoplasm of bone: Secondary | ICD-10-CM

## 2012-08-25 LAB — URINE CULTURE: Colony Count: >=100000

## 2012-08-25 MED ORDER — HYDROMORPHONE HCL 4 MG PO TABS: ORAL_TABLET | ORAL | Status: DC

## 2012-08-25 MED ORDER — FENTANYL 25 MCG/HR TD PT72: 1.0000 | MEDICATED_PATCH | TRANSDERMAL | Status: DC

## 2012-08-25 NOTE — Progress Notes (Signed)
Pt ready for d/c. Went over d/c instructions, follow-up appointments, incision care instructions, and medication changes. PIV removed, WNL. No change in pt condition since AM assessment. Pt d/c'd to home with wife. Eugene Garnet RN

## 2012-08-25 NOTE — Care Management Note (Signed)
CM spoke with pt's current Home Health Agency [facility]. Per agency resumption of care orders required upon discharge with H/P, progress notes, and dc summary. CM to fax MD order and required documents to [phone]. No other needs identified.   Roxy Manns Aleesha Ringstad,RN,BSN [phone]

## 2012-08-25 NOTE — Discharge Summary (Signed)
#   [id number] ids d/c summary.  Caryn Bee  12:13

## 2012-08-26 NOTE — Discharge Summary (Signed)
NAMECEPHUS, TUPY NO.:  [id number]  MEDICAL RECORD NO.:  [id number]  LOCATION:  1314                         FACILITY:  [facility]  PHYSICIAN:  [name], M.D.  DATE OF BIRTH:  [birth year]  DATE OF ADMISSION:  08/15/2012 DATE OF DISCHARGE:  08/25/2012                              DISCHARGE SUMMARY   DIAGNOSES UPON DISCHARGE: 1. Metastatic bladder cancer. 2. Insertion of Port-A-Cath. 3. L4/L5 kyphoplasty. 4. Left inguinal node biopsy. 5. Pain secondary to metastatic disease. 6. Anxiety.  CONDITION UPON DISCHARGE:  Stable.  ACTIVITIES:  As tolerated.  The surgeons have left him instructions regarding lifting.  FOLLOWUP: 1. The patient will follow up at [facility] for     radiation therapy. 2. The patient will follow up with Dr. Biagio Quint __________ Surgery in 10     days. 3. The patient will follow up with Dr. Myna Hidalgo, probably in about 3-4     weeks after radiation is completed.  MEDICATIONS UPON DISCHARGE: 1. Fentanyl patch 25 mcg to the skin every 3 days. 2. Dilaudid 4-8 mg p.o. q.4 hours p.r.n. 3. Klonopin 0.5 mg p.o. t.i.d. p.r.n. 4. Remeron 15 mg p.o. at bedtime.  HOSPITAL COURSE:  Mr. Marchuk was admitted on August 15, 2012.  He came in because of severe bony pain.  He had a spinal METS.  He had a history of bladder and prostate cancer.  As such, we were not sure what was the cause of his metastasis.  Had an MRI on admission.  This did show a lesion in the L4 vertebral body.  This measured 2.8 x 2.7 x 2 cm.  The entire L5 body was infiltrated by tumor.  He did have arthritic changes.  There was periaortic and pelvic adenopathy.  We got a surgery to see him.  They went ahead and performed a left inguinal lymph node excisional biopsy.  This was done on August 17, 2012. The pathology report ([id number]) showed metastatic high-grade carcinoma with squamous differentiation.  This was consistent with high- grade urothelial  cancer.  He had the kyphoplasty on the 10th.  As supposedly enough, the bone marrow of the bone biopsies ([id number]) did not show any obvious metastatic disease.  However, the MRI clearly shows there is metastasis.  Preop, when he came in to the hospital, we went ahead and got him on a Dilaudid drip.  We then were able to convert him over to a fentanyl patch at 25 mcg.  This helped with his pain markedly.  When he was admitted, his labs did not look all that bad.  He was not hypercalcemic.  Alkaline phosphatase was elevated.  His CBC looked pretty good.  His white cell count was up a little bit. He did have a urinary tract infection.  He had Klebsiella in his urine. He was started on Rocephin for this.  He was treated with Rocephin for I think 7 days.  Follow up culture on August 22, 2012, was negative.  He also had a Port-A-Cath placed.  This was placed on the 11th.  This was done without difficulty.  On the 13th, the patient was ready to be discharged.  He was ambulating. He was eating well.  There was no nausea and vomiting.  He was found to have little bit of diarrhea because of excess of stool softeners and laxatives.  Upon discharge, his vital signs were all stable.  Blood pressure 130/64. Temperature was 98.4.  Pulse was 77.  Head and neck exam showed no ocular or oral lesions.  There were no palpable cervical or supraclavicular lymph nodes.  Lungs are clear bilaterally.  Cardiac exam, regular rate and rhythm with normal S1 and S2.  There are no murmurs, rubs, or bruits.  Abdomen exam is soft with good bowel sounds. There is no palpable abdominal mass.  There is no fluid wave.  There is no palpable hepatosplenomegaly.  Extremities shows no clubbing, cyanosis, or edema.  Good strength in his legs.  Good range of motion of his joints.  Skin exam showed no rashes, ecchymosis, or petechia.  His Port-A-Cath site was intact.  His left inguinal lymph node biopsy site also was  intact without erythema, warmth, or swelling.  Neurological showed no focal neurological deficits.  LABORATORY STUDIES:  Upon discharge show white cell count 11.5, hemoglobin 12, hematocrit 38.1, platelet count 287.  Sodium 137, potassium 3.6, BUN 11, creatinine 0.8.  Calcium 8.4 with an albumin of 2.2.  Alkaline phosphatase was 157.     [name], M.D.     PRE/MEDQ  D:  08/25/2012  T:  08/26/2012  Job:  [id number]

## 2012-08-30 ENCOUNTER — Other Ambulatory Visit: Payer: Self-pay | Admitting: Hematology & Oncology

## 2012-08-30 ENCOUNTER — Other Ambulatory Visit (HOSPITAL_COMMUNITY): Payer: Self-pay | Admitting: Interventional Radiology

## 2012-08-30 ENCOUNTER — Telehealth: Payer: Self-pay | Admitting: Hematology & Oncology

## 2012-08-30 DIAGNOSIS — C679 Malignant neoplasm of bladder, unspecified: Secondary | ICD-10-CM

## 2012-08-30 DIAGNOSIS — IMO0002 Reserved for concepts with insufficient information to code with codable children: Secondary | ICD-10-CM

## 2012-08-30 DIAGNOSIS — M549 Dorsalgia, unspecified: Secondary | ICD-10-CM

## 2012-08-30 NOTE — Telephone Encounter (Signed)
Faxed PET order to Tammy at [facility]. Pt aware of 6-24 PET to be NPO 6 hrs and to go to the medical mall entrance at 745 am. He is also aware of 7-7 MD appointment

## 2012-09-05 ENCOUNTER — Ambulatory Visit: Payer: Self-pay

## 2012-09-06 ENCOUNTER — Telehealth: Payer: Self-pay | Admitting: Hematology & Oncology

## 2012-09-06 NOTE — Telephone Encounter (Signed)
Faxed Medical Records via fax today to: [facility]       Attn: Dixie       Ph: [phone]       Fx: [phone]    Medical  Records requested from most recent

## 2012-09-07 ENCOUNTER — Ambulatory Visit: Payer: Self-pay | Admitting: Radiation Oncology

## 2012-09-12 ENCOUNTER — Ambulatory Visit: Payer: Self-pay | Admitting: Radiation Oncology

## 2012-09-12 ENCOUNTER — Encounter (INDEPENDENT_AMBULATORY_CARE_PROVIDER_SITE_OTHER): Payer: Self-pay | Admitting: General Surgery

## 2012-09-14 ENCOUNTER — Encounter (INDEPENDENT_AMBULATORY_CARE_PROVIDER_SITE_OTHER): Payer: Medicaid Other | Admitting: General Surgery

## 2012-09-14 ENCOUNTER — Other Ambulatory Visit: Payer: Self-pay | Admitting: *Deleted

## 2012-09-18 ENCOUNTER — Other Ambulatory Visit: Payer: Medicaid Other | Admitting: Lab

## 2012-09-18 ENCOUNTER — Encounter: Payer: Medicaid Other | Admitting: Hematology & Oncology

## 2012-09-18 ENCOUNTER — Other Ambulatory Visit: Payer: Self-pay | Admitting: *Deleted

## 2012-09-18 ENCOUNTER — Telehealth: Payer: Self-pay | Admitting: Hematology & Oncology

## 2012-09-18 NOTE — Telephone Encounter (Signed)
Per pt mailed 7-28 schedule on letter head.

## 2012-10-09 ENCOUNTER — Ambulatory Visit (HOSPITAL_BASED_OUTPATIENT_CLINIC_OR_DEPARTMENT_OTHER): Payer: Medicaid Other | Admitting: Hematology & Oncology

## 2012-10-09 ENCOUNTER — Ambulatory Visit (HOSPITAL_BASED_OUTPATIENT_CLINIC_OR_DEPARTMENT_OTHER): Payer: Medicaid Other | Admitting: Lab

## 2012-10-09 VITALS — BP 123/73 | HR 99 | Temp 97.6°F | Resp 18 | Ht 71.0 in | Wt 179.0 lb

## 2012-10-09 DIAGNOSIS — C679 Malignant neoplasm of bladder, unspecified: Secondary | ICD-10-CM

## 2012-10-09 LAB — CMP (CANCER CENTER ONLY)
ALT(SGPT): 32 U/L (ref 10–47)
AST: 38 U/L (ref 11–38)
Albumin: 2.4 g/dL — ABNORMAL LOW (ref 3.3–5.5)
Calcium: 8.7 mg/dL (ref 8.0–10.3)
Chloride: 97 mEq/L — ABNORMAL LOW (ref 98–108)
Potassium: 4 mEq/L (ref 3.3–4.7)

## 2012-10-09 LAB — CBC WITH DIFFERENTIAL (CANCER CENTER ONLY)
BASO#: 0 10*3/uL (ref 0.0–0.2)
Eosinophils Absolute: 0.1 10*3/uL (ref 0.0–0.5)
HGB: 12.5 g/dL — ABNORMAL LOW (ref 13.0–17.1)
LYMPH%: 3.6 % — ABNORMAL LOW (ref 14.0–48.0)
MCV: 79 fL — ABNORMAL LOW (ref 82–98)
MONO#: 1 10*3/uL — ABNORMAL HIGH (ref 0.1–0.9)
NEUT#: 13.1 10*3/uL — ABNORMAL HIGH (ref 1.5–6.5)
Platelets: 365 10*3/uL (ref 145–400)
RBC: 4.99 10*6/uL (ref 4.20–5.70)
WBC: 14.9 10*3/uL — ABNORMAL HIGH (ref 4.0–10.0)

## 2012-10-09 LAB — PREALBUMIN: Prealbumin: 7.9 mg/dL — ABNORMAL LOW (ref 17.0–34.0)

## 2012-10-09 LAB — LACTATE DEHYDROGENASE: LDH: 193 U/L (ref 94–250)

## 2012-10-09 MED ORDER — HYDROCODONE-ACETAMINOPHEN 5-325 MG PO TABS: 1.0000 | ORAL_TABLET | Freq: Four times a day (QID) | ORAL | Status: AC | PRN

## 2012-10-09 MED ORDER — MEGESTROL ACETATE 625 MG/5ML PO SUSP: 625.0000 mg | Freq: Every day | ORAL | Status: AC

## 2012-10-09 NOTE — Progress Notes (Signed)
This office note has been dictated.

## 2012-10-09 NOTE — Patient Instructions (Signed)
Carboplatin injection What is this medicine? CARBOPLATIN (KAR boe pla tin) is a chemotherapy drug. It targets fast dividing cells, like cancer cells, and causes these cells to die. This medicine is used to treat ovarian cancer and many other cancers. This medicine may be used for other purposes; ask your health care provider or pharmacist if you have questions. What should I tell my health care provider before I take this medicine? They need to know if you have any of these conditions: -blood disorders -hearing problems -kidney disease -recent or ongoing radiation therapy -an unusual or allergic reaction to carboplatin, cisplatin, other chemotherapy, other medicines, foods, dyes, or preservatives -pregnant or trying to get pregnant -breast-feeding How should I use this medicine? This drug is usually given as an infusion into a vein. It is administered in a hospital or clinic by a specially trained health care professional. Talk to your pediatrician regarding the use of this medicine in children. Special care may be needed. Overdosage: If you think you have taken too much of this medicine contact a poison control center or emergency room at once. NOTE: This medicine is only for you. Do not share this medicine with others. What if I miss a dose? It is important not to miss a dose. Call your doctor or health care professional if you are unable to keep an appointment. What may interact with this medicine? -medicines for seizures -medicines to increase blood counts like filgrastim, pegfilgrastim, sargramostim -some antibiotics like amikacin, gentamicin, neomycin, streptomycin, tobramycin -vaccines Talk to your doctor or health care professional before taking any of these medicines: -acetaminophen -aspirin -ibuprofen -ketoprofen -naproxen This list may not describe all possible interactions. Give your health care provider a list of all the medicines, herbs, non-prescription drugs, or dietary  supplements you use. Also tell them if you smoke, drink alcohol, or use illegal drugs. Some items may interact with your medicine. What should I watch for while using this medicine? Your condition will be monitored carefully while you are receiving this medicine. You will need important blood work done while you are taking this medicine. This drug may make you feel generally unwell. This is not uncommon, as chemotherapy can affect healthy cells as well as cancer cells. Report any side effects. Continue your course of treatment even though you feel ill unless your doctor tells you to stop. In some cases, you may be given additional medicines to help with side effects. Follow all directions for their use. Call your doctor or health care professional for advice if you get a fever, chills or sore throat, or other symptoms of a cold or flu. Do not treat yourself. This drug decreases your body's ability to fight infections. Try to avoid being around people who are sick. This medicine may increase your risk to bruise or bleed. Call your doctor or health care professional if you notice any unusual bleeding. Be careful brushing and flossing your teeth or using a toothpick because you may get an infection or bleed more easily. If you have any dental work done, tell your dentist you are receiving this medicine. Avoid taking products that contain aspirin, acetaminophen, ibuprofen, naproxen, or ketoprofen unless instructed by your doctor. These medicines may hide a fever. Do not become pregnant while taking this medicine. Women should inform their doctor if they wish to become pregnant or think they might be pregnant. There is a potential for serious side effects to an unborn child. Talk to your health care professional or pharmacist for more information.   Do not breast-feed an infant while taking this medicine. What side effects may I notice from receiving this medicine? Side effects that you should report to your  doctor or health care professional as soon as possible: -allergic reactions like skin rash, itching or hives, swelling of the face, lips, or tongue -signs of infection - fever or chills, cough, sore throat, pain or difficulty passing urine -signs of decreased platelets or bleeding - bruising, pinpoint red spots on the skin, black, tarry stools, nosebleeds -signs of decreased red blood cells - unusually weak or tired, fainting spells, lightheadedness -breathing problems -changes in hearing -changes in vision -chest pain -high blood pressure -low blood counts - This drug may decrease the number of white blood cells, red blood cells and platelets. You may be at increased risk for infections and bleeding. -nausea and vomiting -pain, swelling, redness or irritation at the injection site -pain, tingling, numbness in the hands or feet -problems with balance, talking, walking -trouble passing urine or change in the amount of urine Side effects that usually do not require medical attention (report to your doctor or health care professional if they continue or are bothersome): -hair loss -loss of appetite -metallic taste in the mouth or changes in taste This list may not describe all possible side effects. Call your doctor for medical advice about side effects. You may report side effects to FDA at 1-800-FDA-1088. Where should I keep my medicine? This drug is given in a hospital or clinic and will not be stored at home. NOTE: This sheet is a summary. It may not cover all possible information. If you have questions about this medicine, talk to your doctor, pharmacist, or health care provider.  2012, Elsevier/Gold Standard. (06/06/2007 2:38:05 PM)Gemcitabine injection What is this medicine? GEMCITABINE (jem SIT a been) is a chemotherapy drug. This medicine is used to treat many types of cancer like breast cancer, lung cancer, pancreatic cancer, and ovarian cancer. This medicine may be used for other  purposes; ask your health care provider or pharmacist if you have questions. What should I tell my health care provider before I take this medicine? They need to know if you have any of these conditions: -blood disorders -infection -kidney disease -liver disease -recent or ongoing radiation therapy -an unusual or allergic reaction to gemcitabine, other chemotherapy, other medicines, foods, dyes, or preservatives -pregnant or trying to get pregnant -breast-feeding How should I use this medicine? This drug is given as an infusion into a vein. It is administered in a hospital or clinic by a specially trained health care professional. Talk to your pediatrician regarding the use of this medicine in children. Special care may be needed. Overdosage: If you think you have taken too much of this medicine contact a poison control center or emergency room at once. NOTE: This medicine is only for you. Do not share this medicine with others. What if I miss a dose? It is important not to miss your dose. Call your doctor or health care professional if you are unable to keep an appointment. What may interact with this medicine? -medicines to increase blood counts like filgrastim, pegfilgrastim, sargramostim -some other chemotherapy drugs like cisplatin -vaccines Talk to your doctor or health care professional before taking any of these medicines: -acetaminophen -aspirin -ibuprofen -ketoprofen -naproxen This list may not describe all possible interactions. Give your health care provider a list of all the medicines, herbs, non-prescription drugs, or dietary supplements you use. Also tell them if you smoke, drink alcohol, or   use illegal drugs. Some items may interact with your medicine. What should I watch for while using this medicine? Visit your doctor for checks on your progress. This drug may make you feel generally unwell. This is not uncommon, as chemotherapy can affect healthy cells as well as  cancer cells. Report any side effects. Continue your course of treatment even though you feel ill unless your doctor tells you to stop. In some cases, you may be given additional medicines to help with side effects. Follow all directions for their use. Call your doctor or health care professional for advice if you get a fever, chills or sore throat, or other symptoms of a cold or flu. Do not treat yourself. This drug decreases your body's ability to fight infections. Try to avoid being around people who are sick. This medicine may increase your risk to bruise or bleed. Call your doctor or health care professional if you notice any unusual bleeding. Be careful brushing and flossing your teeth or using a toothpick because you may get an infection or bleed more easily. If you have any dental work done, tell your dentist you are receiving this medicine. Avoid taking products that contain aspirin, acetaminophen, ibuprofen, naproxen, or ketoprofen unless instructed by your doctor. These medicines may hide a fever. Women should inform their doctor if they wish to become pregnant or think they might be pregnant. There is a potential for serious side effects to an unborn child. Talk to your health care professional or pharmacist for more information. Do not breast-feed an infant while taking this medicine. What side effects may I notice from receiving this medicine? Side effects that you should report to your doctor or health care professional as soon as possible: -allergic reactions like skin rash, itching or hives, swelling of the face, lips, or tongue -low blood counts - this medicine may decrease the number of white blood cells, red blood cells and platelets. You may be at increased risk for infections and bleeding. -signs of infection - fever or chills, cough, sore throat, pain or difficulty passing urine -signs of decreased platelets or bleeding - bruising, pinpoint red spots on the skin, black, tarry  stools, blood in the urine -signs of decreased red blood cells - unusually weak or tired, fainting spells, lightheadedness -breathing problems -chest pain -mouth sores -nausea and vomiting -pain, swelling, redness at site where injected -pain, tingling, numbness in the hands or feet -stomach pain -swelling of ankles, feet, hands -unusual bleeding Side effects that usually do not require medical attention (report to your doctor or health care professional if they continue or are bothersome): -constipation -diarrhea -hair loss -loss of appetite -stomach upset This list may not describe all possible side effects. Call your doctor for medical advice about side effects. You may report side effects to FDA at 1-800-FDA-1088. Where should I keep my medicine? This drug is given in a hospital or clinic and will not be stored at home. NOTE: This sheet is a summary. It may not cover all possible information. If you have questions about this medicine, talk to your doctor, pharmacist, or health care provider.  2013, Elsevier/Gold Standard. (07/11/2007 6:45:54 PM)  

## 2012-10-10 ENCOUNTER — Telehealth: Payer: Self-pay | Admitting: Hematology & Oncology

## 2012-10-10 ENCOUNTER — Encounter: Payer: Self-pay | Admitting: Hematology & Oncology

## 2012-10-10 NOTE — Telephone Encounter (Signed)
Pt aware of 8-6 appointment and all scheduled thru September.

## 2012-10-10 NOTE — Progress Notes (Signed)
DIAGNOSIS:  Metastatic bladder cancer.  CURRENT THERAPY:  Patient status post palliative radiation therapy.  INTERIM HISTORY:  The patient comes in for followup.  We last saw him in the office back in June.  At that point in time, we had to admit him. He was having a lot of pain issues.  He was admitted over to [facility].  He had a left inguinal node biopsy.  Pathology report ([id number]) showed metastatic high-grade urothelial cancer.  He did have kyphoplasty.  This helped with some of his back pain.  He was subsequently discharged.  He had palliative radiation therapy over at [facility].  We did go ahead and get a PET scan on him.  This also was done at [facility].  The PET scan showed widespread disease.  He had intrathoracic disease.  He had lymphadenopathy and pulmonary parenchymal disease.  He had liver metastasis.  He had abdominal retroperitoneal lymph node involvement.  He has lost 18 pounds since we last saw him.  He is on a Duragesic patch at 25 mcg.  This does seem to be helping him.  He just says that he does not have much of an appetite.  I am going to try him on some Megace elixir.  He has had no bleeding.  He has a urostomy which is working okay.  He has some slight swelling of the left leg.  This has been chronic.  PHYSICAL EXAMINATION:  General:  This is a somewhat thin-appearing white gentleman in no obvious distress.  Vital signs:  Temperature of 97.6, pulse 99, respiratory rate 18, blood pressure 123/73.  Weight is 179. Head and neck:  Normocephalic, atraumatic skull.  There are no ocular or oral lesions.  There are no palpable cervical or supraclavicular lymph nodes.  Lungs:  Clear bilaterally.  Cardiac:  Regular rate and rhythm with a normal S1 and S2.  There are no murmurs, rubs or bruits. Abdomen:  Soft.  He has good bowel sounds.  There is no fluid wave. There is no palpable hepatosplenomegaly.  Extremities:  Show  no clubbing, cyanosis or edema.  There may be some slight nonpitting edema of the left leg.  Skin:  Shows no rashes.  LABORATORY STUDIES:  White cell count of 14.9, hemoglobin 12.5, hematocrit 39.3, platelet count 355. Calcium is 9.4 with an albumin of 2.4.  IMPRESSION:  The patient is a 57 year old gentleman with metastatic bladder cancer.  He has completed palliative radiation.  His performance status is ECOG 1-2.  It is going to be tough to really be aggressive with him.  We really got to try to get his weight up.  His albumin is quite low.  I do think however, that we could be able to get away with carboplatin/Gemzar.  I think this is reasonable. I talked to he and his wife about this.  He knows that he is not going to be cured.  I told him that our goal is to try to prolong his life and try to give him a better quality of life.  He has a Port-A-Cath in.  This was put in while he was hospitalized.  We will try to get him started next week.  Hopefully, his weight will be coming up.  He will need Zometa along with chemotherapy.  He has a bony metastasis that we need to make sure to minimize risk of fracture.  I spent a good 45 minutes with he and his wife.  I gave  him information about the chemotherapy.  We will try get him started next week.  I will plan to see him back when he has his second cycle of treatment.  We are going to need to repeat his PET scan after his second cycle of treatment.  This will be done at [facility].    ______________________________ Josph Macho, M.D. PRE/MEDQ  D:  10/09/2012  T:  10/10/2012  Job:  [id number]

## 2012-10-10 NOTE — Telephone Encounter (Signed)
Wife called said need to contact transportation and fax schedule . Left message with Misty Stanley [phone], I talked with Melissa at transportation [phone] fax [phone]

## 2012-10-11 ENCOUNTER — Telehealth: Payer: Self-pay | Admitting: Hematology & Oncology

## 2012-10-11 NOTE — Telephone Encounter (Signed)
Medicaid left message needing letter that pt needs to come here for treatments and MD appointments. MD aware said he would write letter. Pt aware and will get a ride for first appointment until we can get this turned in.

## 2012-10-13 ENCOUNTER — Ambulatory Visit: Payer: Self-pay | Admitting: Radiation Oncology

## 2012-10-17 ENCOUNTER — Telehealth: Payer: Self-pay | Admitting: *Deleted

## 2012-10-17 NOTE — Telephone Encounter (Signed)
Pt called stating he was having diarrhea and doesn't think he can make it here for treatment without pulling over or having "an accident". Asked if he was taking anything for it and he stated Imodium. Further questioned him on how he was taking it and he stated "2 tabs then one or two more". Explained to him that we would prefer him take 2 stools at the beginning of each day when the diarrhea starts then 1 tab after each loose stool thereafter. He is also having more edema in his legs. Encouraged him to work on his diarrhea using the Imodium today, to drink plenty of fluids, and to keep his appt just in case. He needs to start chemo to help decrease the size of his lymph nodes. He verbalized understanding and will try to come in tomorrow.

## 2012-10-18 ENCOUNTER — Ambulatory Visit: Payer: Medicaid Other

## 2012-10-18 ENCOUNTER — Encounter: Payer: Self-pay | Admitting: Pharmacist

## 2012-10-19 ENCOUNTER — Telehealth: Payer: Self-pay | Admitting: Hematology & Oncology

## 2012-10-19 NOTE — Telephone Encounter (Signed)
Talked with wife she said RN knew they weren't coming on 8-6 and they come to next appointment 8-13

## 2012-10-23 ENCOUNTER — Emergency Department (HOSPITAL_COMMUNITY)
Admission: EM | Admit: 2012-10-23 | Discharge: 2012-10-23 | Disposition: A | Discharge status: Home / Self Care | Payer: Medicaid Other | Attending: Emergency Medicine | Admitting: Emergency Medicine

## 2012-10-23 ENCOUNTER — Emergency Department (HOSPITAL_COMMUNITY): Payer: Medicaid Other

## 2012-10-23 ENCOUNTER — Encounter (HOSPITAL_COMMUNITY): Payer: Self-pay | Admitting: Emergency Medicine

## 2012-10-23 DIAGNOSIS — R5381 Other malaise: Secondary | ICD-10-CM | POA: Insufficient documentation

## 2012-10-23 DIAGNOSIS — E860 Dehydration: Secondary | ICD-10-CM | POA: Insufficient documentation

## 2012-10-23 DIAGNOSIS — C7951 Secondary malignant neoplasm of bone: Secondary | ICD-10-CM | POA: Insufficient documentation

## 2012-10-23 DIAGNOSIS — Z87448 Personal history of other diseases of urinary system: Secondary | ICD-10-CM | POA: Insufficient documentation

## 2012-10-23 DIAGNOSIS — F419 Anxiety disorder, unspecified: Secondary | ICD-10-CM

## 2012-10-23 DIAGNOSIS — Z8551 Personal history of malignant neoplasm of bladder: Secondary | ICD-10-CM | POA: Insufficient documentation

## 2012-10-23 DIAGNOSIS — R5383 Other fatigue: Secondary | ICD-10-CM | POA: Insufficient documentation

## 2012-10-23 DIAGNOSIS — Z8619 Personal history of other infectious and parasitic diseases: Secondary | ICD-10-CM | POA: Insufficient documentation

## 2012-10-23 DIAGNOSIS — R630 Anorexia: Secondary | ICD-10-CM | POA: Insufficient documentation

## 2012-10-23 DIAGNOSIS — Z8719 Personal history of other diseases of the digestive system: Secondary | ICD-10-CM | POA: Insufficient documentation

## 2012-10-23 DIAGNOSIS — C801 Malignant (primary) neoplasm, unspecified: Secondary | ICD-10-CM

## 2012-10-23 DIAGNOSIS — F411 Generalized anxiety disorder: Secondary | ICD-10-CM | POA: Insufficient documentation

## 2012-10-23 DIAGNOSIS — Z87891 Personal history of nicotine dependence: Secondary | ICD-10-CM | POA: Insufficient documentation

## 2012-10-23 DIAGNOSIS — Z9889 Other specified postprocedural states: Secondary | ICD-10-CM | POA: Insufficient documentation

## 2012-10-23 DIAGNOSIS — Z8546 Personal history of malignant neoplasm of prostate: Secondary | ICD-10-CM | POA: Insufficient documentation

## 2012-10-23 DIAGNOSIS — R197 Diarrhea, unspecified: Secondary | ICD-10-CM | POA: Insufficient documentation

## 2012-10-23 DIAGNOSIS — G4700 Insomnia, unspecified: Secondary | ICD-10-CM | POA: Insufficient documentation

## 2012-10-23 DIAGNOSIS — Z79899 Other long term (current) drug therapy: Secondary | ICD-10-CM | POA: Insufficient documentation

## 2012-10-23 HISTORY — DX: Malignant neoplasm of bone and articular cartilage, unspecified: C41.9

## 2012-10-23 LAB — CBC WITH DIFFERENTIAL/PLATELET
Basophils Absolute: 0 10*3/uL (ref 0.0–0.1)
Basophils Relative: 0 % (ref 0–1)
MCHC: 32.3 g/dL (ref 30.0–36.0)
Neutro Abs: 11.4 10*3/uL — ABNORMAL HIGH (ref 1.7–7.7)
Neutrophils Relative %: 91 % — ABNORMAL HIGH (ref 43–77)
Platelets: 332 10*3/uL (ref 150–400)
RDW: 16 % — ABNORMAL HIGH (ref 11.5–15.5)

## 2012-10-23 LAB — BASIC METABOLIC PANEL
Calcium: 9.5 mg/dL (ref 8.4–10.5)
GFR calc Af Amer: 78 mL/min — ABNORMAL LOW (ref >90)
GFR calc non Af Amer: 68 mL/min — ABNORMAL LOW (ref >90)
Potassium: 3.7 mEq/L (ref 3.5–5.1)
Sodium: 131 mEq/L — ABNORMAL LOW (ref 135–145)

## 2012-10-23 MED ORDER — SODIUM CHLORIDE 0.9 % IV BOLUS (SEPSIS)
1000.0000 mL | Freq: Once | INTRAVENOUS | Status: AC
  Administered 2012-10-23: 1000 mL via INTRAVENOUS

## 2012-10-23 MED ORDER — ALPRAZOLAM 0.5 MG PO TABS: ORAL_TABLET | ORAL | Status: AC

## 2012-10-23 MED ORDER — ALPRAZOLAM 0.5 MG PO TABS
1.0000 mg | ORAL_TABLET | Freq: Once | ORAL | Status: AC
  Administered 2012-10-23: 1 mg via ORAL
  Filled 2012-10-23: qty 2

## 2012-10-23 NOTE — ED Notes (Signed)
States he feels better than when he arrived to ED tonight.

## 2012-10-23 NOTE — ED Notes (Signed)
Pt states he needs something of his nerves.

## 2012-10-23 NOTE — ED Provider Notes (Signed)
CSN: [id number]     Arrival date & time 10/23/12  1947 History  This chart was scribed for Edwin Lennert, MD, by Yevette Edwards, ED Scribe. This patient was seen in room APA05/APA05 and the patient's care was started at 8:01 PM.    First MD Initiated Contact with Patient 10/23/12 2000     Chief Complaint  Patient presents with  . Shortness of Breath  . Diarrhea  . Insomnia    Patient is a 57 y.o. male presenting with shortness of breath and diarrhea. The history is provided by the patient and the spouse. No language interpreter was used.  Shortness of Breath Severity:  Mild Onset quality:  Sudden Timing:  Constant Progression:  Unchanged Relieved by:  Nothing Worsened by:  Nothing tried Ineffective treatments:  None tried Risk factors: hx of cancer and tobacco use   Diarrhea  HPI Comments: [name] is a 58 y.o. male who presents to the Emergency Department complaining of SOB. He reports that he used to take two xanax at night, but his xanax was changed to klonopin recently. The pt states that the klonopin is not working as well as the xanax did. The pt reports feeling weak, and he reports that he not been eating due to recent radiation treatment. He states that he has also experienced diarrhea due to the radiation treatment. The pt experiences diarrhea 1 to 2 times a day. The pt has a h/o bladder cancer, prostate cancer, and bone cancer. He is a former smoker, but he denies using alcohol.   The pt's oncologist is Dr. Noe Gens with [facility]. His next appointment is in two days.  Past Medical History  Diagnosis Date  . Gross hematuria   . C. difficile enteritis 07/04/2011  . Diverticulitis   . Subcutaneous emphysema, postoperative 01/07/2012  . Cirrhosis 01/07/2012    Radiographically  . Splenomegaly   . 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 Acute  pyelonephritis 01/07/2012  . Prostate cancer 08/11/2012  . Bone cancer    Past Surgical History  Procedure Laterality Date  . Elbow surgery    . Transurethral resection of bladder tumor  10/26/2011    Procedure: TRANSURETHRAL RESECTION OF BLADDER TUMOR (TURBT);  Surgeon: Ky Barban, MD;  Location: [facility];  Service: Urology;  Laterality: N/A;  . Transurethral resection of prostate  10/26/2011    Procedure: TRANSURETHRAL RESECTION OF THE PROSTATE (TURP);  Surgeon: Ky Barban, MD;  Location: [facility];  Service: Urology;  Laterality: N/A;  . Inguinal hernia repair Left 08/17/2012    Procedure: Left inguinal lymph node excision;  Surgeon: Lodema Pilot, DO;  Location: [facility];  Service: General;  Laterality: Left;  . Portacath placement N/A 08/23/2012    Procedure: INSERTION PORT-A-CATH;  Surgeon: Kandis Cocking, MD;  Location: [facility];  Service: General;  Laterality: N/A;  . Back surgery     Family History  Problem Relation Age of Onset  . Heart failure Father   . Hypertension Mother    History  Substance Use Topics  . Smoking status: Former Smoker    Quit date: 12/03/2007  . Smokeless tobacco: Never Used  . Alcohol Use: No    Review of Systems  Constitutional: Positive for appetite change.  Respiratory: Positive for shortness of breath.   Gastrointestinal: Positive for diarrhea.  All other systems reviewed and  are negative.    Allergies  Review of patient's allergies indicates no known allergies.  Home Medications   Current Outpatient Rx  Name  Route  Sig  Dispense  Refill  . clonazePAM (KLONOPIN) 0.5 MG tablet   Oral   Take 0.5 mg by mouth 3 (three) times daily as needed for anxiety.          . fentaNYL (DURAGESIC - DOSED MCG/HR) 25 MCG/HR   Transdermal   Place 1 patch (25 mcg total) onto the skin every 3 (three) days.   10 patch   0   . HYDROcodone-acetaminophen (NORCO/VICODIN) 5-325 MG per tablet   Oral   Take 1 tablet by mouth every 6 (six) hours as  needed for pain.   120 tablet   0   . HYDROmorphone (DILAUDID) 4 MG tablet      Take 1-2, IF NEEDED, every 4 hrs for pain.   120 tablet   0   . megestrol (MEGACE ES) 625 MG/5ML suspension   Oral   Take 5 mLs (625 mg total) by mouth daily.   150 mL   0   . mirtazapine (REMERON) 15 MG tablet   Oral   Take 15 mg by mouth at bedtime.         . MULTIPLE VITAMIN PO   Oral   Take by mouth every morning.          Triage Vitals: BP 114/65  Pulse 104  Temp(Src) 98.1 F (36.7 C) (Oral)  Resp 20  Ht 5\' 8"  (1.727 m)  Wt 179 lb (81.194 kg)  BMI 27.22 kg/m2  SpO2 100%  Physical Exam  Constitutional: He is oriented to person, place, and time.  Cachetic. Mild dehydration.  HENT:  Head: Normocephalic.  Eyes: Conjunctivae and EOM are normal. No scleral icterus.  Neck: Neck supple. No thyromegaly present.  Cardiovascular: Normal rate and regular rhythm.  Exam reveals no gallop and no friction rub.   No murmur heard. Pulmonary/Chest: Effort normal. No stridor. He has no wheezes. He has no rales. He exhibits no tenderness.  Abdominal: He exhibits no distension. There is no tenderness. There is no rebound.  Musculoskeletal: Normal range of motion. He exhibits no edema.  Lymphadenopathy:    He has no cervical adenopathy.  Neurological: He is oriented to person, place, and time. Coordination normal.  Skin: No rash noted. No erythema.  Psychiatric: He has a normal mood and affect. His behavior is normal.    ED Course   DIAGNOSTIC STUDIES: Oxygen Saturation is 100% on room air, normal by my interpretation.    COORDINATION OF CARE:  8:10 PM-Discussed treatment plan with patient, and the patient agreed to the plan.   Procedures (including critical care time)  Labs Reviewed  CBC WITH DIFFERENTIAL - Abnormal; Notable for the following:    WBC 12.4 (*)    RBC 4.18 (*)    Hemoglobin 10.4 (*)    HCT 32.2 (*)    MCV 77.0 (*)    MCH 24.9 (*)    RDW 16.0 (*)    Neutrophils  Relative % 91 (*)    Neutro Abs 11.4 (*)    Lymphocytes Relative 4 (*)    Lymphs Abs 0.5 (*)    All other components within normal limits  BASIC METABOLIC PANEL - Abnormal; Notable for the following:    Sodium 131 (*)    Chloride 94 (*)    Glucose, Bld 106 (*)    BUN 27 (*)  GFR calc non Af Amer 68 (*)    GFR calc Af Amer 78 (*)    All other components within normal limits   Dg Chest 2 View  10/23/2012   *RADIOLOGY REPORT*  Clinical Data: Short of breath.  Bilateral leg swelling.  Bladder cancer and prostate cancer.  Metastatic disease.  CHEST - 2 VIEW  Comparison: CT abdomen 07/10/2012.  Findings: Nodular densities are present over the lung bases, which may represent nipple shadows or pulmonary nodules identified on prior CT; favor nipple shadows.  Fullness of the right hilum is present which is a chronic finding and there is no interval change compared to prior exam.  Left subclavian Port-A-Cath is present with the tip in the mid SVC at the level of the carina.  There is no airspace disease or pleural effusion identified.  Hyperinflation is present consistent with emphysema.  IMPRESSION: No acute cardiopulmonary disease.  Emphysema.  Nodular opacities over the lung bases may represent nipple shadows or pulmonary nodules.   Original Report Authenticated By: Andreas Newport, M.D.   No diagnosis found.  MDM    The chart was scribed for me under my direct supervision.  I personally performed the history, physical, and medical decision making and all procedures in the evaluation of this patient.Edwin Lennert, MD 10/23/12 [phone]

## 2012-10-23 NOTE — ED Notes (Signed)
Pt was recently diagnosed with bone cancer and having intermittent diarrhea. Pt also c/o sob.

## 2012-10-25 ENCOUNTER — Encounter: Payer: Self-pay | Admitting: Hematology & Oncology

## 2012-10-25 ENCOUNTER — Other Ambulatory Visit (HOSPITAL_BASED_OUTPATIENT_CLINIC_OR_DEPARTMENT_OTHER): Payer: Medicaid Other | Admitting: Lab

## 2012-10-25 ENCOUNTER — Ambulatory Visit (HOSPITAL_BASED_OUTPATIENT_CLINIC_OR_DEPARTMENT_OTHER): Payer: Medicaid Other

## 2012-10-25 ENCOUNTER — Other Ambulatory Visit: Payer: Self-pay | Admitting: Oncology

## 2012-10-25 VITALS — BP 122/54 | HR 77 | Temp 98.7°F | Resp 18

## 2012-10-25 DIAGNOSIS — C679 Malignant neoplasm of bladder, unspecified: Secondary | ICD-10-CM

## 2012-10-25 DIAGNOSIS — Z5111 Encounter for antineoplastic chemotherapy: Secondary | ICD-10-CM

## 2012-10-25 LAB — CMP (CANCER CENTER ONLY)
Albumin: 2.3 g/dL — ABNORMAL LOW (ref 3.3–5.5)
Alkaline Phosphatase: 369 U/L — ABNORMAL HIGH (ref 26–84)
CO2: 30 mEq/L (ref 18–33)
Chloride: 95 mEq/L — ABNORMAL LOW (ref 98–108)
Glucose, Bld: 94 mg/dL (ref 73–118)
Potassium: 3.8 mEq/L (ref 3.3–4.7)
Sodium: 134 mEq/L (ref 128–145)
Total Protein: 6.6 g/dL (ref 6.4–8.1)

## 2012-10-25 LAB — CBC WITH DIFFERENTIAL (CANCER CENTER ONLY)
Eosinophils Absolute: 0 10*3/uL (ref 0.0–0.5)
LYMPH#: 0.6 10*3/uL — ABNORMAL LOW (ref 0.9–3.3)
MONO#: 1.2 10*3/uL — ABNORMAL HIGH (ref 0.1–0.9)
MONO%: 9.7 % (ref 0.0–13.0)
NEUT#: 10.4 10*3/uL — ABNORMAL HIGH (ref 1.5–6.5)
Platelets: 327 10*3/uL (ref 145–400)
RBC: 4.33 10*6/uL (ref 4.20–5.70)
WBC: 12.2 10*3/uL — ABNORMAL HIGH (ref 4.0–10.0)

## 2012-10-25 MED ORDER — ONDANSETRON HCL 8 MG PO TABS: ORAL_TABLET | ORAL | Status: DC

## 2012-10-25 MED ORDER — LORAZEPAM 0.5 MG PO TABS: 0.5000 mg | ORAL_TABLET | Freq: Four times a day (QID) | ORAL | Status: DC | PRN

## 2012-10-25 MED ORDER — SODIUM CHLORIDE 0.9 % IV SOLN
800.0000 mg/m2 | Freq: Once | INTRAVENOUS | Status: AC
  Administered 2012-10-25: 1634 mg via INTRAVENOUS
  Filled 2012-10-25: qty 42.98

## 2012-10-25 MED ORDER — SODIUM CHLORIDE 0.9 % IV SOLN
Freq: Once | INTRAVENOUS | Status: AC
  Administered 2012-10-25: 14:00:00 via INTRAVENOUS

## 2012-10-25 MED ORDER — PROCHLORPERAZINE MALEATE 10 MG PO TABS: 10.0000 mg | ORAL_TABLET | Freq: Four times a day (QID) | ORAL | Status: AC | PRN

## 2012-10-25 MED ORDER — DEXAMETHASONE 4 MG PO TABS: 8.0000 mg | ORAL_TABLET | Freq: Two times a day (BID) | ORAL | Status: AC

## 2012-10-25 MED ORDER — ONDANSETRON HCL 8 MG PO TABS: ORAL_TABLET | ORAL | Status: AC

## 2012-10-25 MED ORDER — LIDOCAINE-PRILOCAINE 2.5-2.5 % EX CREA: TOPICAL_CREAM | CUTANEOUS | Status: AC | PRN

## 2012-10-25 MED ORDER — DEXAMETHASONE SODIUM PHOSPHATE 20 MG/5ML IJ SOLN
20.0000 mg | Freq: Once | INTRAMUSCULAR | Status: AC
  Administered 2012-10-25: 20 mg via INTRAVENOUS
  Filled 2012-10-25: qty 5

## 2012-10-25 MED ORDER — ONDANSETRON 16 MG/50ML IVPB (CHCC)
16.0000 mg | Freq: Once | INTRAVENOUS | Status: AC
  Administered 2012-10-25: 16 mg via INTRAVENOUS

## 2012-10-25 MED ORDER — SODIUM CHLORIDE 0.9 % IV SOLN
500.0000 mg | Freq: Once | INTRAVENOUS | Status: AC
  Administered 2012-10-25: 500 mg via INTRAVENOUS
  Filled 2012-10-25: qty 50

## 2012-10-25 MED ORDER — HEPARIN SOD (PORK) LOCK FLUSH 100 UNIT/ML IV SOLN
500.0000 [IU] | Freq: Once | INTRAVENOUS | Status: AC | PRN
  Administered 2012-10-25: 500 [IU]
  Filled 2012-10-25: qty 5

## 2012-10-25 NOTE — Patient Instructions (Addendum)
Gemcitabine injection  What is this medicine?  GEMCITABINE (jem SIT a been) is a chemotherapy drug. This medicine is used to treat many types of cancer like breast cancer, lung cancer, pancreatic cancer, and ovarian cancer.  This medicine may be used for other purposes; ask your health care provider or pharmacist if you have questions.  What should I tell my health care provider before I take this medicine?  They need to know if you have any of these conditions:  -blood disorders  -infection  -kidney disease  -liver disease  -recent or ongoing radiation therapy  -an unusual or allergic reaction to gemcitabine, other chemotherapy, other medicines, foods, dyes, or preservatives  -pregnant or trying to get pregnant  -breast-feeding  How should I use this medicine?  This drug is given as an infusion into a vein. It is administered in a hospital or clinic by a specially trained health care professional.  Talk to your pediatrician regarding the use of this medicine in children. Special care may be needed.  Overdosage: If you think you have taken too much of this medicine contact a poison control center or emergency room at once.  NOTE: This medicine is only for you. Do not share this medicine with others.  What if I miss a dose?  It is important not to miss your dose. Call your doctor or health care professional if you are unable to keep an appointment.  What may interact with this medicine?  -medicines to increase blood counts like filgrastim, pegfilgrastim, sargramostim  -some other chemotherapy drugs like cisplatin  -vaccines  Talk to your doctor or health care professional before taking any of these medicines:  -acetaminophen  -aspirin  -ibuprofen  -ketoprofen  -naproxen  This list may not describe all possible interactions. Give your health care provider a list of all the medicines, herbs, non-prescription drugs, or dietary supplements you use. Also tell them if you smoke, drink alcohol, or use illegal drugs. Some  items may interact with your medicine.  What should I watch for while using this medicine?  Visit your doctor for checks on your progress. This drug may make you feel generally unwell. This is not uncommon, as chemotherapy can affect healthy cells as well as cancer cells. Report any side effects. Continue your course of treatment even though you feel ill unless your doctor tells you to stop.  In some cases, you may be given additional medicines to help with side effects. Follow all directions for their use.  Call your doctor or health care professional for advice if you get a fever, chills or sore throat, or other symptoms of a cold or flu. Do not treat yourself. This drug decreases your body's ability to fight infections. Try to avoid being around people who are sick.  This medicine may increase your risk to bruise or bleed. Call your doctor or health care professional if you notice any unusual bleeding.  Be careful brushing and flossing your teeth or using a toothpick because you may get an infection or bleed more easily. If you have any dental work done, tell your dentist you are receiving this medicine.  Avoid taking products that contain aspirin, acetaminophen, ibuprofen, naproxen, or ketoprofen unless instructed by your doctor. These medicines may hide a fever.  Women should inform their doctor if they wish to become pregnant or think they might be pregnant. There is a potential for serious side effects to an unborn child. Talk to your health care professional or pharmacist for   reactions like skin rash, itching or hives, swelling of the face, lips, or tongue -low blood counts - this medicine may decrease the number of white blood cells, red blood cells and  platelets. You may be at increased risk for infections and bleeding. -signs of infection - fever or chills, cough, sore throat, pain or difficulty passing urine -signs of decreased platelets or bleeding - bruising, pinpoint red spots on the skin, black, tarry stools, blood in the urine -signs of decreased red blood cells - unusually weak or tired, fainting spells, lightheadedness -breathing problems -chest pain -mouth sores -nausea and vomiting -pain, swelling, redness at site where injected -pain, tingling, numbness in the hands or feet -stomach pain -swelling of ankles, feet, hands -unusual bleeding Side effects that usually do not require medical attention (report to your doctor or health care professional if they continue or are bothersome): -constipation -diarrhea -hair loss -loss of appetite -stomach upset This list may not describe all possible side effects. Call your doctor for medical advice about side effects. You may report side effects to FDA at 1-800-FDA-1088. Where should I keep my medicine? This drug is given in a hospital or clinic and will not be stored at home. NOTE: This sheet is a summary. It may not cover all possible information. If you have questions about this medicine, talk to your doctor, pharmacist, or health care provider.  2013, [employer]. (07/11/2007 6:45:54 PM)   Carboplatin injection  What is this medicine? CARBOPLATIN (KAR boe pla tin) is a chemotherapy drug. It targets fast dividing cells, like cancer cells, and causes these cells to die. This medicine is used to treat ovarian cancer and many other cancers. This medicine may be used for other purposes; ask your health care provider or pharmacist if you have questions. What should I tell my health care provider before I take this medicine? They need to know if you have any of these conditions: -blood disorders -hearing problems -kidney disease -recent or ongoing radiation therapy -an  unusual or allergic reaction to carboplatin, cisplatin, other chemotherapy, other medicines, foods, dyes, or preservatives -pregnant or trying to get pregnant -breast-feeding How should I use this medicine? This drug is usually given as an infusion into a vein. It is administered in a hospital or clinic by a specially trained health care professional. Talk to your pediatrician regarding the use of this medicine in children. Special care may be needed. Overdosage: If you think you have taken too much of this medicine contact a poison control center or emergency room at once. NOTE: This medicine is only for you. Do not share this medicine with others. What if I miss a dose? It is important not to miss a dose. Call your doctor or health care professional if you are unable to keep an appointment. What may interact with this medicine? -medicines for seizures -medicines to increase blood counts like filgrastim, pegfilgrastim, sargramostim -some antibiotics like amikacin, gentamicin, neomycin, streptomycin, tobramycin -vaccines Talk to your doctor or health care professional before taking any of these medicines: -acetaminophen -aspirin -ibuprofen -ketoprofen -naproxen This list may not describe all possible interactions. Give your health care provider a list of all the medicines, herbs, non-prescription drugs, or dietary supplements you use. Also tell them if you smoke, drink alcohol, or use illegal drugs. Some items may interact with your medicine. What should I watch for while using this medicine? Your condition will be monitored carefully while you are receiving this medicine. You will need important blood work done  while you are taking this medicine. This drug may make you feel generally unwell. This is not uncommon, as chemotherapy can affect healthy cells as well as cancer cells. Report any side effects. Continue your course of treatment even though you feel ill unless your doctor tells you to  stop. In some cases, you may be given additional medicines to help with side effects. Follow all directions for their use. Call your doctor or health care professional for advice if you get a fever, chills or sore throat, or other symptoms of a cold or flu. Do not treat yourself. This drug decreases your body's ability to fight infections. Try to avoid being around people who are sick. This medicine may increase your risk to bruise or bleed. Call your doctor or health care professional if you notice any unusual bleeding. Be careful brushing and flossing your teeth or using a toothpick because you may get an infection or bleed more easily. If you have any dental work done, tell your dentist you are receiving this medicine. Avoid taking products that contain aspirin, acetaminophen, ibuprofen, naproxen, or ketoprofen unless instructed by your doctor. These medicines may hide a fever. Do not become pregnant while taking this medicine. Women should inform their doctor if they wish to become pregnant or think they might be pregnant. There is a potential for serious side effects to an unborn child. Talk to your health care professional or pharmacist for more information. Do not breast-feed an infant while taking this medicine. What side effects may I notice from receiving this medicine? Side effects that you should report to your doctor or health care professional as soon as possible: -allergic reactions like skin rash, itching or hives, swelling of the face, lips, or tongue -signs of infection - fever or chills, cough, sore throat, pain or difficulty passing urine -signs of decreased platelets or bleeding - bruising, pinpoint red spots on the skin, black, tarry stools, nosebleeds -signs of decreased red blood cells - unusually weak or tired, fainting spells, lightheadedness -breathing problems -changes in hearing -changes in vision -chest pain -high blood pressure -low blood counts - This drug may  decrease the number of white blood cells, red blood cells and platelets. You may be at increased risk for infections and bleeding. -nausea and vomiting -pain, swelling, redness or irritation at the injection site -pain, tingling, numbness in the hands or feet -problems with balance, talking, walking -trouble passing urine or change in the amount of urine Side effects that usually do not require medical attention (report to your doctor or health care professional if they continue or are bothersome): -hair loss -loss of appetite -metallic taste in the mouth or changes in taste This list may not describe all possible side effects. Call your doctor for medical advice about side effects. You may report side effects to FDA at 1-800-FDA-1088. Where should I keep my medicine? This drug is given in a hospital or clinic and will not be stored at home. NOTE: This sheet is a summary. It may not cover all possible information. If you have questions about this medicine, talk to your doctor, pharmacist, or health care provider.  2012, [employer]. (06/06/2007 2:38:05 PM)  Zoledronic Acid injection (Hypercalcemia, Oncology) What is this medicine? ZOLEDRONIC ACID (ZOE le dron ik AS id) lowers the amount of calcium loss from bone. It is used to treat too much calcium in your blood from cancer. It is also used to prevent complications of cancer that has spread to the bone.  This medicine may be used for other purposes; ask your health care provider or pharmacist if you have questions. What should I tell my health care provider before I take this medicine? They need to know if you have any of these conditions: -aspirin-sensitive asthma -dental disease -kidney disease -an unusual or allergic reaction to zoledronic acid, other medicines, foods, dyes, or preservatives -pregnant or trying to get pregnant -breast-feeding How should I use this medicine? This medicine is for infusion into a vein. It is  given by a health care professional in a hospital or clinic setting. Talk to your pediatrician regarding the use of this medicine in children. Special care may be needed. Overdosage: If you think you have taken too much of this medicine contact a poison control center or emergency room at once. NOTE: This medicine is only for you. Do not share this medicine with others. What if I miss a dose? It is important not to miss your dose. Call your doctor or health care professional if you are unable to keep an appointment. What may interact with this medicine? -certain antibiotics given by injection -NSAIDs, medicines for pain and inflammation, like ibuprofen or naproxen -some diuretics like bumetanide, furosemide -teriparatide -thalidomide This list may not describe all possible interactions. Give your health care provider a list of all the medicines, herbs, non-prescription drugs, or dietary supplements you use. Also tell them if you smoke, drink alcohol, or use illegal drugs. Some items may interact with your medicine. What should I watch for while using this medicine? Visit your doctor or health care professional for regular checkups. It may be some time before you see the benefit from this medicine. Do not stop taking your medicine unless your doctor tells you to. Your doctor may order blood tests or other tests to see how you are doing. Women should inform their doctor if they wish to become pregnant or think they might be pregnant. There is a potential for serious side effects to an unborn child. Talk to your health care professional or pharmacist for more information. You should make sure that you get enough calcium and vitamin D while you are taking this medicine. Discuss the foods you eat and the vitamins you take with your health care professional. Some people who take this medicine have severe bone, joint, and/or muscle pain. This medicine may also increase your risk for a broken thigh bone.  Tell your doctor right away if you have pain in your upper leg or groin. Tell your doctor if you have any pain that does not go away or that gets worse. What side effects may I notice from receiving this medicine? Side effects that you should report to your doctor or health care professional as soon as possible: -allergic reactions like skin rash, itching or hives, swelling of the face, lips, or tongue -anxiety, confusion, or depression -breathing problems -changes in vision -feeling faint or lightheaded, falls -jaw burning, cramping, pain -muscle cramps, stiffness, or weakness -trouble passing urine or change in the amount of urine Side effects that usually do not require medical attention (report to your doctor or health care professional if they continue or are bothersome): -bone, joint, or muscle pain -fever -hair loss -irritation at site where injected -loss of appetite -nausea, vomiting -stomach upset -tired This list may not describe all possible side effects. Call your doctor for medical advice about side effects. You may report side effects to FDA at 1-800-FDA-1088. Where should I keep my medicine? This drug is  given in a hospital or clinic and will not be stored at home. NOTE: This sheet is a summary. It may not cover all possible information. If you have questions about this medicine, talk to your doctor, pharmacist, or health care provider.  2013, [employer] Standard. (08/28/2010 9:06:58 AM) [facility] Discharge Instructions for Patients Receiving Chemotherapy  Today you received the following chemotherapy agents :  Gemzar and Carboplatin.  To help prevent nausea and vomiting after your treatment, we encourage you to take your nausea medication:  Zofran 8mg  - 1 tab every 12 hours for 3 days starting the day after chemo,  then every 12 hours as needed for nausea or vomiting;   Ativan 0.5 mg - place 1 tab under your tongue every 8 hours as needed for  nausea, vomiting, anxiety, or sleep.  Compazine 10 mg - Take every six hours as needed for nausea.  Decadron 4 mg - Take two tabs twice a day with meals starting the day after chemo for 3 days.  Ativan 0.5 mg - every six hours as needed for nausea.  Other helpful medications Colace - this is a stool softener. Take 100mg  capsule (2) 1-2 times a day as needed. If you have to take more than 6 capsules of Colace a day call the Cancer Center. Milk of Magnesia - this is a laxative used to treat moderate to severe constipation. May take 2-4 tablespoons every 8 hours as needed. May increase to 8 tablespoons x 1 dose and if no bowel movement call the Cancer Center  Medication to numb your Port: EMLA Cream Apply quarter size application to [place] site 1 hour prior to chemotherapy. Do NOT rub in. Cover area with plastic wrap.    If you develop nausea and vomiting that is not controlled by your nausea medication, call the clinic. If it is after clinic hours your family physician or the after hours number for the clinic or go to the Emergency Department.   BELOW ARE SYMPTOMS THAT SHOULD BE REPORTED IMMEDIATELY:  *FEVER GREATER THAN 100.5 F  *CHILLS WITH OR WITHOUT FEVER  NAUSEA AND VOMITING THAT IS NOT CONTROLLED WITH YOUR NAUSEA MEDICATION  *UNUSUAL SHORTNESS OF BREATH  *UNUSUAL BRUISING OR BLEEDING  TENDERNESS IN MOUTH AND THROAT WITH OR WITHOUT PRESENCE OF ULCERS  *URINARY PROBLEMS  *BOWEL PROBLEMS  UNUSUAL RASH Items with * indicate a potential emergency and should be followed up as soon as possible.  One of the nurses will contact you 24 hours after your treatment. Please let the nurse know about any problems that you may have experienced. Feel free to call the clinic you have any questions or concerns. The clinic phone number is [phone].   I have been informed and understand all the instructions given to me. I know to contact the clinic, my physician, or go to the Emergency  Department if any problems should occur. I do not have any questions at this time, but understand that I may call the clinic during office hours   should I have any questions or need assistance in obtaining follow up care.    __________________________________________  _____________  __________ Signature of Patient or Authorized Representative            Date                   Time    __________________________________________ Nurse's Signature

## 2012-10-26 ENCOUNTER — Encounter: Payer: Self-pay | Admitting: Oncology

## 2012-10-26 NOTE — Progress Notes (Signed)
24 Hour Chemotherapy Follow up Call  [name] called at home following administration of chemotherapy. Patient reports no S&S of side effects.  Medications reviewed Decadron 8mg  Take 2 times the day before, the day of and the day after chemo & Zofran.   Following interventions recommended :  Call office for any issues.  Reviewed all post chemotherapy instructions with patient and when should call clinic or MD.  Patient verbalized understanding.

## 2012-10-29 ENCOUNTER — Inpatient Hospital Stay (HOSPITAL_COMMUNITY)
Admission: EM | Admit: 2012-10-29 | Discharge: 2012-11-01 | DRG: 729 | Disposition: A | Discharge status: Home Health | Payer: Medicaid Other | Attending: Family Medicine | Admitting: Family Medicine

## 2012-10-29 ENCOUNTER — Encounter (HOSPITAL_COMMUNITY): Payer: Self-pay | Admitting: *Deleted

## 2012-10-29 ENCOUNTER — Emergency Department (HOSPITAL_COMMUNITY): Payer: Medicaid Other

## 2012-10-29 DIAGNOSIS — T451X5A Adverse effect of antineoplastic and immunosuppressive drugs, initial encounter: Secondary | ICD-10-CM | POA: Diagnosis present

## 2012-10-29 DIAGNOSIS — Z86718 Personal history of other venous thrombosis and embolism: Secondary | ICD-10-CM

## 2012-10-29 DIAGNOSIS — D649 Anemia, unspecified: Secondary | ICD-10-CM | POA: Diagnosis present

## 2012-10-29 DIAGNOSIS — R198 Other specified symptoms and signs involving the digestive system and abdomen: Secondary | ICD-10-CM | POA: Diagnosis present

## 2012-10-29 DIAGNOSIS — C787 Secondary malignant neoplasm of liver and intrahepatic bile duct: Secondary | ICD-10-CM | POA: Diagnosis present

## 2012-10-29 DIAGNOSIS — D6481 Anemia due to antineoplastic chemotherapy: Secondary | ICD-10-CM | POA: Diagnosis present

## 2012-10-29 DIAGNOSIS — B370 Candidal stomatitis: Secondary | ICD-10-CM | POA: Diagnosis present

## 2012-10-29 DIAGNOSIS — R195 Other fecal abnormalities: Secondary | ICD-10-CM | POA: Diagnosis present

## 2012-10-29 DIAGNOSIS — N390 Urinary tract infection, site not specified: Secondary | ICD-10-CM | POA: Diagnosis present

## 2012-10-29 DIAGNOSIS — K7460 Unspecified cirrhosis of liver: Secondary | ICD-10-CM | POA: Diagnosis present

## 2012-10-29 DIAGNOSIS — Z6828 Body mass index (BMI) 28.0-28.9, adult: Secondary | ICD-10-CM

## 2012-10-29 DIAGNOSIS — Z79899 Other long term (current) drug therapy: Secondary | ICD-10-CM

## 2012-10-29 DIAGNOSIS — D6959 Other secondary thrombocytopenia: Secondary | ICD-10-CM | POA: Diagnosis present

## 2012-10-29 DIAGNOSIS — C679 Malignant neoplasm of bladder, unspecified: Secondary | ICD-10-CM | POA: Diagnosis present

## 2012-10-29 DIAGNOSIS — Z87891 Personal history of nicotine dependence: Secondary | ICD-10-CM

## 2012-10-29 DIAGNOSIS — C7800 Secondary malignant neoplasm of unspecified lung: Secondary | ICD-10-CM | POA: Diagnosis present

## 2012-10-29 DIAGNOSIS — D500 Iron deficiency anemia secondary to blood loss (chronic): Secondary | ICD-10-CM

## 2012-10-29 DIAGNOSIS — C7951 Secondary malignant neoplasm of bone: Secondary | ICD-10-CM | POA: Diagnosis present

## 2012-10-29 DIAGNOSIS — Z923 Personal history of irradiation: Secondary | ICD-10-CM

## 2012-10-29 DIAGNOSIS — E8809 Other disorders of plasma-protein metabolism, not elsewhere classified: Secondary | ICD-10-CM | POA: Diagnosis present

## 2012-10-29 DIAGNOSIS — C61 Malignant neoplasm of prostate: Secondary | ICD-10-CM | POA: Diagnosis present

## 2012-10-29 DIAGNOSIS — E43 Unspecified severe protein-calorie malnutrition: Secondary | ICD-10-CM | POA: Diagnosis present

## 2012-10-29 DIAGNOSIS — K6289 Other specified diseases of anus and rectum: Secondary | ICD-10-CM

## 2012-10-29 DIAGNOSIS — E876 Hypokalemia: Secondary | ICD-10-CM | POA: Diagnosis present

## 2012-10-29 DIAGNOSIS — D638 Anemia in other chronic diseases classified elsewhere: Secondary | ICD-10-CM | POA: Diagnosis present

## 2012-10-29 DIAGNOSIS — R000 Tachycardia, unspecified: Secondary | ICD-10-CM | POA: Diagnosis present

## 2012-10-29 DIAGNOSIS — N498 Inflammatory disorders of other specified male genital organs: Secondary | ICD-10-CM | POA: Diagnosis present

## 2012-10-29 DIAGNOSIS — F411 Generalized anxiety disorder: Secondary | ICD-10-CM | POA: Diagnosis present

## 2012-10-29 DIAGNOSIS — L0390 Cellulitis, unspecified: Secondary | ICD-10-CM

## 2012-10-29 DIAGNOSIS — N5089 Other specified disorders of the male genital organs: Principal | ICD-10-CM | POA: Diagnosis present

## 2012-10-29 DIAGNOSIS — R1310 Dysphagia, unspecified: Secondary | ICD-10-CM | POA: Diagnosis present

## 2012-10-29 LAB — COMPREHENSIVE METABOLIC PANEL
ALT: 64 U/L — ABNORMAL HIGH (ref 0–53)
AST: 116 U/L — ABNORMAL HIGH (ref 0–37)
Albumin: 2 g/dL — ABNORMAL LOW (ref 3.5–5.2)
Alkaline Phosphatase: 316 U/L — ABNORMAL HIGH (ref 39–117)
Potassium: 3 mEq/L — ABNORMAL LOW (ref 3.5–5.1)
Sodium: 131 mEq/L — ABNORMAL LOW (ref 135–145)
Total Protein: 5.6 g/dL — ABNORMAL LOW (ref 6.0–8.3)

## 2012-10-29 LAB — CBC WITH DIFFERENTIAL/PLATELET
Basophils Relative: 0 % (ref 0–1)
Eosinophils Absolute: 0 10*3/uL (ref 0.0–0.7)
Lymphs Abs: 0.3 10*3/uL — ABNORMAL LOW (ref 0.7–4.0)
MCH: 24.5 pg — ABNORMAL LOW (ref 26.0–34.0)
Neutrophils Relative %: 97 % — ABNORMAL HIGH (ref 43–77)
Platelets: 184 10*3/uL (ref 150–400)
RBC: 3.87 MIL/uL — ABNORMAL LOW (ref 4.22–5.81)

## 2012-10-29 LAB — URINALYSIS, ROUTINE W REFLEX MICROSCOPIC
Bilirubin Urine: NEGATIVE
Glucose, UA: NEGATIVE mg/dL
Ketones, ur: NEGATIVE mg/dL
pH: 7 (ref 5.0–8.0)

## 2012-10-29 LAB — URINE MICROSCOPIC-ADD ON

## 2012-10-29 MED ORDER — MIRTAZAPINE 30 MG PO TABS
15.0000 mg | ORAL_TABLET | Freq: Every day | ORAL | Status: DC
  Administered 2012-10-29 – 2012-10-31 (×3): 15 mg via ORAL
  Filled 2012-10-29 (×3): qty 1

## 2012-10-29 MED ORDER — CEFTRIAXONE SODIUM 1 G IJ SOLR
1.0000 g | Freq: Once | INTRAMUSCULAR | Status: AC
Start: 2012-10-29 — End: 2012-10-29
  Administered 2012-10-29: 1 g via INTRAVENOUS
  Filled 2012-10-29: qty 10

## 2012-10-29 MED ORDER — LORAZEPAM 0.5 MG PO TABS
0.5000 mg | ORAL_TABLET | Freq: Four times a day (QID) | ORAL | Status: DC | PRN
  Administered 2012-11-01: 0.5 mg via ORAL
  Filled 2012-10-29: qty 1

## 2012-10-29 MED ORDER — MAGIC MOUTHWASH W/LIDOCAINE
10.0000 mL | Freq: Once | ORAL | Status: AC
  Administered 2012-10-29: 10 mL via ORAL
  Filled 2012-10-29: qty 10

## 2012-10-29 MED ORDER — VANCOMYCIN HCL IN DEXTROSE 1-5 GM/200ML-% IV SOLN
INTRAVENOUS | Status: AC
  Filled 2012-10-29: qty 200

## 2012-10-29 MED ORDER — FUROSEMIDE 10 MG/ML IJ SOLN
20.0000 mg | Freq: Two times a day (BID) | INTRAMUSCULAR | Status: DC
  Administered 2012-10-29 – 2012-11-01 (×6): 20 mg via INTRAVENOUS
  Filled 2012-10-29 (×6): qty 2

## 2012-10-29 MED ORDER — PANTOPRAZOLE SODIUM 40 MG IV SOLR
40.0000 mg | INTRAVENOUS | Status: DC
  Administered 2012-10-29 – 2012-10-30 (×2): 40 mg via INTRAVENOUS
  Filled 2012-10-29 (×2): qty 40

## 2012-10-29 MED ORDER — POTASSIUM CHLORIDE CRYS ER 20 MEQ PO TBCR
40.0000 meq | EXTENDED_RELEASE_TABLET | Freq: Once | ORAL | Status: AC
Start: 2012-10-29 — End: 2012-10-29
  Administered 2012-10-29: 40 meq via ORAL
  Filled 2012-10-29: qty 2

## 2012-10-29 MED ORDER — FLUCONAZOLE 100 MG PO TABS
150.0000 mg | ORAL_TABLET | Freq: Once | ORAL | Status: AC
  Administered 2012-10-29: 150 mg via ORAL
  Filled 2012-10-29: qty 2

## 2012-10-29 MED ORDER — ACETAMINOPHEN 325 MG PO TABS: 650.0000 mg | ORAL_TABLET | Freq: Four times a day (QID) | ORAL | Status: DC | PRN

## 2012-10-29 MED ORDER — DEXTROSE 5 % IV SOLN
1.0000 g | INTRAVENOUS | Status: DC
  Administered 2012-10-30 – 2012-10-31 (×2): 1 g via INTRAVENOUS
  Filled 2012-10-29 (×4): qty 10

## 2012-10-29 MED ORDER — LORAZEPAM 2 MG/ML IJ SOLN
1.0000 mg | Freq: Once | INTRAMUSCULAR | Status: AC
  Administered 2012-10-29: 1 mg via INTRAVENOUS
  Filled 2012-10-29: qty 1

## 2012-10-29 MED ORDER — FENTANYL 50 MCG/HR TD PT72
50.0000 ug | MEDICATED_PATCH | TRANSDERMAL | Status: DC
  Administered 2012-10-29 – 2012-11-01 (×2): 50 ug via TRANSDERMAL
  Filled 2012-10-29 (×2): qty 1

## 2012-10-29 MED ORDER — FLUCONAZOLE 100MG IVPB
100.0000 mg | INTRAVENOUS | Status: DC
  Administered 2012-10-30: 100 mg via INTRAVENOUS
  Filled 2012-10-29 (×2): qty 50

## 2012-10-29 MED ORDER — VANCOMYCIN HCL IN DEXTROSE 1-5 GM/200ML-% IV SOLN
1000.0000 mg | Freq: Two times a day (BID) | INTRAVENOUS | Status: DC
  Administered 2012-10-29 – 2012-10-31 (×4): 1000 mg via INTRAVENOUS
  Filled 2012-10-29 (×6): qty 200

## 2012-10-29 MED ORDER — ONDANSETRON HCL 4 MG PO TABS: 4.0000 mg | ORAL_TABLET | Freq: Four times a day (QID) | ORAL | Status: DC | PRN

## 2012-10-29 MED ORDER — SODIUM CHLORIDE 0.9 % IJ SOLN
3.0000 mL | Freq: Two times a day (BID) | INTRAMUSCULAR | Status: DC
  Administered 2012-10-30 – 2012-11-01 (×3): 3 mL via INTRAVENOUS

## 2012-10-29 MED ORDER — HYDROCODONE-ACETAMINOPHEN 5-325 MG PO TABS
1.0000 | ORAL_TABLET | Freq: Four times a day (QID) | ORAL | Status: DC | PRN
  Administered 2012-10-29 – 2012-11-01 (×3): 1 via ORAL
  Filled 2012-10-29 (×3): qty 1

## 2012-10-29 MED ORDER — VANCOMYCIN HCL IN DEXTROSE 1-5 GM/200ML-% IV SOLN
1000.0000 mg | Freq: Once | INTRAVENOUS | Status: AC
  Administered 2012-10-29: 1000 mg via INTRAVENOUS
  Filled 2012-10-29: qty 200

## 2012-10-29 MED ORDER — MAGIC MOUTHWASH: 5.0000 mL | Freq: Four times a day (QID) | ORAL | Status: DC

## 2012-10-29 MED ORDER — MAGIC MOUTHWASH W/LIDOCAINE
10.0000 mL | Freq: Four times a day (QID) | ORAL | Status: DC
  Filled 2012-10-29 (×4): qty 10

## 2012-10-29 MED ORDER — BIOTENE DRY MOUTH MT LIQD
15.0000 mL | Freq: Two times a day (BID) | OROMUCOSAL | Status: DC
  Administered 2012-10-29 – 2012-11-01 (×6): 15 mL via OROMUCOSAL

## 2012-10-29 MED ORDER — LIDOCAINE VISCOUS 2 % MT SOLN: 5.0000 mL | Freq: Four times a day (QID) | OROMUCOSAL | Status: DC

## 2012-10-29 MED ORDER — SODIUM CHLORIDE 0.9 % IJ SOLN: 3.0000 mL | INTRAMUSCULAR | Status: DC | PRN

## 2012-10-29 MED ORDER — ACETAMINOPHEN 650 MG RE SUPP: 650.0000 mg | Freq: Four times a day (QID) | RECTAL | Status: DC | PRN

## 2012-10-29 MED ORDER — ALPRAZOLAM 0.5 MG PO TABS
0.5000 mg | ORAL_TABLET | Freq: Three times a day (TID) | ORAL | Status: DC
  Administered 2012-10-29 – 2012-11-01 (×10): 0.5 mg via ORAL
  Filled 2012-10-29 (×10): qty 1

## 2012-10-29 MED ORDER — ONDANSETRON HCL 4 MG/2ML IJ SOLN: 4.0000 mg | Freq: Three times a day (TID) | INTRAMUSCULAR | Status: DC | PRN

## 2012-10-29 MED ORDER — MEGESTROL ACETATE 625 MG/5ML PO SUSP
625.0000 mg | Freq: Every day | ORAL | Status: DC
  Filled 2012-10-29 (×9): qty 5

## 2012-10-29 MED ORDER — ONDANSETRON HCL 4 MG/2ML IJ SOLN: 4.0000 mg | Freq: Four times a day (QID) | INTRAMUSCULAR | Status: DC | PRN

## 2012-10-29 MED ORDER — MAGIC MOUTHWASH
5.0000 mL | Freq: Four times a day (QID) | ORAL | Status: DC
  Administered 2012-10-29 – 2012-11-01 (×12): 5 mL via ORAL
  Filled 2012-10-29 (×11): qty 5

## 2012-10-29 MED ORDER — SODIUM CHLORIDE 0.9 % IJ SOLN
3.0000 mL | Freq: Two times a day (BID) | INTRAMUSCULAR | Status: DC
  Administered 2012-10-30 – 2012-10-31 (×3): 3 mL via INTRAVENOUS

## 2012-10-29 MED ORDER — MEGESTROL ACETATE 400 MG/10ML PO SUSP
400.0000 mg | Freq: Every day | ORAL | Status: DC
  Administered 2012-10-29 – 2012-11-01 (×4): 400 mg via ORAL
  Filled 2012-10-29 (×3): qty 10

## 2012-10-29 MED ORDER — SODIUM CHLORIDE 0.9 % IV SOLN: 250.0000 mL | INTRAVENOUS | Status: DC | PRN

## 2012-10-29 MED ORDER — SODIUM CHLORIDE 0.9 % IV SOLN
Freq: Once | INTRAVENOUS | Status: AC
  Administered 2012-10-29: 08:00:00 via INTRAVENOUS

## 2012-10-29 MED ORDER — HYDROMORPHONE HCL PF 1 MG/ML IJ SOLN
1.0000 mg | Freq: Once | INTRAMUSCULAR | Status: AC
  Administered 2012-10-29: 1 mg via INTRAVENOUS
  Filled 2012-10-29: qty 1

## 2012-10-29 MED ORDER — POTASSIUM CHLORIDE 20 MEQ/15ML (10%) PO LIQD
40.0000 meq | ORAL | Status: AC
  Administered 2012-10-29 (×3): 40 meq via ORAL
  Filled 2012-10-29 (×3): qty 30

## 2012-10-29 MED ORDER — HYDROMORPHONE HCL PF 1 MG/ML IJ SOLN
0.5000 mg | Freq: Once | INTRAMUSCULAR | Status: AC
  Administered 2012-10-29: 0.5 mg via INTRAVENOUS
  Filled 2012-10-29: qty 1

## 2012-10-29 MED ORDER — MAGIC MOUTHWASH W/LIDOCAINE: 10.0000 mL | Freq: Four times a day (QID) | ORAL | Status: DC

## 2012-10-29 NOTE — ED Provider Notes (Addendum)
CSN: [id number]     Arrival date & time 10/29/12  4098 History     First MD Initiated Contact with Patient 10/29/12 [phone]     Chief Complaint  Patient presents with  . Shortness of Breath   (Consider location/radiation/quality/duration/timing/severity/associated sxs/prior Treatment) HPI Comments: Pt with hx of metastatic CA involving the GU system, bones and per patient's wife lung and liver as well. Pt has hx of anxiety as well. Comes in with cc of dib. Per patient's wife, patient woke up, and started stating that he had dib and difficulty swallowing. Pt also complains of scrotal swelling and some pain in his abdomen. There is no n/v/f/c. Pt c/o some chest pain, diffuse, non radiating. Pt denies hx of DVT, PE, records indicate he did have DVT and was on Lovenox at some point. Pt has a cough, with white sputum and denies any uti like sx.  Patient is a 57 y.o. male presenting with shortness of breath. The history is provided by the patient.  Shortness of Breath Associated symptoms: abdominal pain, chest pain and cough   Associated symptoms: no fever, no headaches, no neck pain and no vomiting     Past Medical History  Diagnosis Date  . Gross hematuria   . C. difficile enteritis 07/04/2011  . Diverticulitis   . Subcutaneous emphysema, postoperative 01/07/2012  . Cirrhosis 01/07/2012    Radiographically  . Splenomegaly   . Bladder cancer 01/07/2012    Transitional Cell Carcinoma, s/p bladder, prostate and lymph node resection with ileal conduit at [facility] on 10/9, discharged on 10/14   . Acute pyelonephritis 01/07/2012  . Prostate cancer 08/11/2012  . Bone cancer    Past Surgical History  Procedure Laterality Date  . Elbow surgery    . Transurethral resection of bladder tumor  10/26/2011    Procedure: TRANSURETHRAL RESECTION OF BLADDER TUMOR (TURBT);  Surgeon: Ky Barban, MD;  Location: [facility];  Service: Urology;  Laterality: N/A;  . Transurethral resection of prostate  10/26/2011     Procedure: TRANSURETHRAL RESECTION OF THE PROSTATE (TURP);  Surgeon: Ky Barban, MD;  Location: [facility];  Service: Urology;  Laterality: N/A;  . Inguinal hernia repair Left 08/17/2012    Procedure: Left inguinal lymph node excision;  Surgeon: Lodema Pilot, DO;  Location: [facility];  Service: General;  Laterality: Left;  . Portacath placement N/A 08/23/2012    Procedure: INSERTION PORT-A-CATH;  Surgeon: Kandis Cocking, MD;  Location: [facility];  Service: General;  Laterality: N/A;  . Back surgery     Family History  Problem Relation Age of Onset  . Heart failure Father   . Hypertension Mother    History  Substance Use Topics  . Smoking status: Former Smoker    Quit date: 12/03/2007  . Smokeless tobacco: Never Used  . Alcohol Use: No    Review of Systems  Constitutional: Negative for fever, chills and activity change.  HENT: Positive for trouble swallowing. Negative for neck pain.   Eyes: Negative for visual disturbance.  Respiratory: Positive for cough and shortness of breath. Negative for chest tightness.   Cardiovascular: Positive for chest pain and leg swelling. Negative for palpitations.  Gastrointestinal: Positive for abdominal pain and abdominal distention. Negative for nausea, vomiting and diarrhea.  Genitourinary: Positive for scrotal swelling. Negative for dysuria, enuresis and difficulty urinating.  Musculoskeletal: Negative for arthralgias.  Neurological: Negative for dizziness, light-headedness and headaches.  Psychiatric/Behavioral: Negative for confusion. The patient is nervous/anxious.     Allergies  Review of patient's allergies indicates no known allergies.  Home Medications   Current Outpatient Rx  Name  Route  Sig  Dispense  Refill  . ALPRAZolam (XANAX) 0.5 MG tablet      Take one every 8 hours for nerves.  Take one or 2 at bedtime as needed   60 tablet   1   . clonazePAM (KLONOPIN) 0.5 MG tablet   Oral   Take 0.5 mg by mouth 3 (three) times daily as  needed for anxiety.          Marland Kitchen dexamethasone (DECADRON) 4 MG tablet   Oral   Take 2 tablets (8 mg total) by mouth 2 (two) times daily with a meal. Take two times a day starting the day after chemo for 3 days.   30 tablet   1   . fentaNYL (DURAGESIC - DOSED MCG/HR) 50 MCG/HR   Transdermal   Place 1 patch onto the skin every 3 (three) days.         Marland Kitchen HYDROcodone-acetaminophen (NORCO/VICODIN) 5-325 MG per tablet   Oral   Take 1 tablet by mouth every 6 (six) hours as needed for pain.   120 tablet   0   . lidocaine-prilocaine (EMLA) cream   Topical   Apply topically as needed. Apply to port one hour before chemo treatment is to start.   30 g   0   . LORazepam (ATIVAN) 0.5 MG tablet   Oral   Take 1 tablet (0.5 mg total) by mouth every 6 (six) hours as needed for anxiety (nausea and vomiting).   30 tablet   0   . megestrol (MEGACE ES) 625 MG/5ML suspension   Oral   Take 5 mLs (625 mg total) by mouth daily.   150 mL   0   . mirtazapine (REMERON) 15 MG tablet   Oral   Take 15 mg by mouth at bedtime.         . Multiple Vitamin (MULTIVITAMIN WITH MINERALS) TABS tablet   Oral   Take 1 tablet by mouth daily.         . ondansetron (ZOFRAN) 8 MG tablet      Take 1 tabs twice daily starting the day after chemo for 3 days, then twice daily as needed for nausea and vomiting.   30 tablet   1   . prochlorperazine (COMPAZINE) 10 MG tablet   Oral   Take 1 tablet (10 mg total) by mouth every 6 (six) hours as needed.   30 tablet   1    BP 130/76  Pulse 103  Temp(Src) 97.9 F (36.6 C) (Oral)  Resp 22  Ht 5\' 7"  (1.702 m)  Wt 179 lb (81.194 kg)  BMI 28.03 kg/m2  SpO2 100% Physical Exam  Nursing note and vitals reviewed. Constitutional: He is oriented to person, place, and time. He appears well-developed.  HENT:  Head: Normocephalic and atraumatic.  Pt has oral thrush  Eyes: Conjunctivae and EOM are normal. Pupils are equal, round, and reactive to light.  Neck:  Normal range of motion. Neck supple. No JVD present.  Cardiovascular: Regular rhythm.   Murmur heard. Pulmonary/Chest: Effort normal and breath sounds normal. No respiratory distress. He has no wheezes. He has no rales.  Abdominal: Soft. Bowel sounds are normal. He exhibits no distension. There is no rebound and no guarding.  Mild diffuse tenderness, no rebound or guarding  Genitourinary:  Bilateral scrotal swelling, with some erythema. No hernia.  Neurological:  He is alert and oriented to person, place, and time.  Skin: Skin is warm.  Psychiatric:  restless    ED Course   Procedures (including critical care time)  Labs Reviewed  URINE CULTURE  CBC WITH DIFFERENTIAL  COMPREHENSIVE METABOLIC PANEL  URINALYSIS, ROUTINE W REFLEX MICROSCOPIC  TROPONIN I   No results found. No diagnosis found.  MDM   Date: 10/29/2012  Rate: 93  Rhythm: normal sinus rhythm  QRS Axis: left  Intervals: normal  ST/T Wave abnormalities: nonspecific ST/T changes  Conduction Disutrbances:right bundle branch block and left anterior fascicular block  Narrative Interpretation:   Old EKG Reviewed: changes noted   Pt comes in with multiple complains. Hx of cancer, on chemo.  - Scrotal swelling New problem. No torsion suspected, as swelling x 3 days with constant, moderate pain [place] ordered to ensure there is no epididymo/orchitis. Suspect hydrocoele.  - DIB Slightly tachycardic, RR close to 20 PE is on the ddx, especially if there is any hx of DVT. Will have to get CT PE vs. VQ Will get troponin, EKG  - Abd pain Benign abd exam. Able to swallow OK, speak OK to me, no change in voice, no airway issue. Do appreciate oral thrush, likely cause for his dysphagia   Derwood Kaplan, MD 10/29/12 0927  10:04 AM Pt was given ativan, and his RR, HR improved. He was extremely restless at arrival, and that improved. I have suspended PE workup, as it appeared that the initial tachycardia and dyspnea -  was all anxiety related. There is a Hb drop of close to 2 grams, DRE - there is a rectal mass, firm. Possibly tumor? No melena.  Derwood Kaplan, MD 10/29/12 1006

## 2012-10-29 NOTE — ED Notes (Signed)
Thrush noted over soft palate, tongue, oropharynx and bottom gumline.  Dr. Soledad Gerlach made aware.

## 2012-10-29 NOTE — H&P (Signed)
[facility] History and Physical  [name] ZOX:[id number] DOB: Jul 22, 1955 DOA: 10/29/2012  Referring physician: Dr. Rhunette Croft, ER physician PCP: Pcp Not In System  Specialists: Oncologist: Dr. Myna Hidalgo  Chief Complaint: scrotal edema, difficulty breathing  HPI: [name] is a 57 y.o. 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 The patient has had cystectomy with urostomy placement. He was recently started on chemotherapy approximately one week ago. He reports that since being started on chemotherapy, he has felt very poorly. He had noticed worsening swelling, tenderness in his scrotum. He also posterior of breath for the past several days. He's been coughing up white-colored sputum. He is difficulty swallowing due to pain. He has chest pain which is worse with coughing. He's generally weak. He reports having frequent diarrhea since being started on chemotherapy. He's also had nausea and vomiting. Patient reports feeling febrile. He denies any frank melanotic, hematochezia. He does not take NSAIDs. Since he has no symptoms post chemotherapy, he has not contacted his oncologist. He came to the emergency room today when he felt the symptoms were getting worse. He was evaluated in the emergency room due to multiple complaints, he was referred for admission.  Review of Systems: Pertinent positives as per history of present illness, otherwise negative  Past Medical History  Diagnosis Date  . Gross hematuria   . C. difficile enteritis 07/04/2011  . Diverticulitis   . Subcutaneous emphysema, postoperative 01/07/2012  . Cirrhosis 01/07/2012    Radiographically  . Splenomegaly   . Bladder cancer 01/07/2012    Transitional Cell Carcinoma, s/p bladder, prostate and lymph node resection with ileal conduit at [facility] on 10/9, discharged on 10/14   . Acute pyelonephritis 01/07/2012  . Prostate cancer 08/11/2012  . Bone cancer    Past Surgical  History  Procedure Laterality Date  . Elbow surgery    . Transurethral resection of bladder tumor  10/26/2011    Procedure: TRANSURETHRAL RESECTION OF BLADDER TUMOR (TURBT);  Surgeon: Ky Barban, MD;  Location: [facility];  Service: Urology;  Laterality: N/A;  . Transurethral resection of prostate  10/26/2011    Procedure: TRANSURETHRAL RESECTION OF THE PROSTATE (TURP);  Surgeon: Ky Barban, MD;  Location: [facility];  Service: Urology;  Laterality: N/A;  . Inguinal hernia repair Left 08/17/2012    Procedure: Left inguinal lymph node excision;  Surgeon: Lodema Pilot, DO;  Location: [facility];  Service: General;  Laterality: Left;  . Portacath placement N/A 08/23/2012    Procedure: INSERTION PORT-A-CATH;  Surgeon: Kandis Cocking, MD;  Location: [facility];  Service: General;  Laterality: N/A;  . Back surgery     Social History:  reports that he quit smoking about 4 years ago. He has never used smokeless tobacco. He reports that he does not drink alcohol or use illicit drugs.   No Known Allergies  Family History  Problem Relation Age of Onset  . Heart failure Father   . Hypertension Mother      Prior to Admission medications   Medication Sig Start Date End Date Taking? Authorizing Provider  ALPRAZolam Prudy Feeler) 0.5 MG tablet Take one every 8 hours for nerves.  Take one or 2 at bedtime as needed 10/23/12  Yes [name], MD  dexamethasone (DECADRON) 4 MG tablet Take 2 tablets (8 mg total) by mouth 2 (two) times daily with a meal. Take two times a day starting the day after chemo for 3 days. 10/25/12  Yes Josph Macho, MD  fentaNYL (DURAGESIC - DOSED MCG/HR) 50 MCG/HR Place 1 patch onto the skin every 3 (three) days.   Yes Historical Provider, MD  HYDROcodone-acetaminophen (NORCO/VICODIN) 5-325 MG per tablet Take 1 tablet by mouth every 6 (six) hours as needed for pain. 10/09/12  Yes Josph Macho, MD  lidocaine-prilocaine (EMLA) cream Apply topically as needed. Apply to port one hour before  chemo treatment is to start. 10/25/12  Yes Josph Macho, MD  LORazepam (ATIVAN) 0.5 MG tablet Take 1 tablet (0.5 mg total) by mouth every 6 (six) hours as needed for anxiety (nausea and vomiting). 10/25/12  Yes Josph Macho, MD  megestrol (MEGACE ES) 625 MG/5ML suspension Take 5 mLs (625 mg total) by mouth daily. 10/09/12  Yes Josph Macho, MD  mirtazapine (REMERON) 15 MG tablet Take 15 mg by mouth at bedtime.   Yes Historical Provider, MD  Multiple Vitamin (MULTIVITAMIN WITH MINERALS) TABS tablet Take 1 tablet by mouth daily.   Yes Historical Provider, MD  ondansetron (ZOFRAN) 8 MG tablet Take 1 tabs twice daily starting the day after chemo for 3 days, then twice daily as needed for nausea and vomiting. 10/25/12  Yes Josph Macho, MD  prochlorperazine (COMPAZINE) 10 MG tablet Take 1 tablet (10 mg total) by mouth every 6 (six) hours as needed. 10/25/12  Yes Josph Macho, MD   Physical Exam: Filed Vitals:   10/29/12 1433  BP: 109/59  Pulse: 88  Temp: 97.8 F (36.6 C)  Resp: 16     General:  No acute distress, appears to be chronically ill  Eyes: Pupils are equal round react to light  ENT: Oral thrush is present  Neck: Supple, large circumscribed, circular protuberance in patient's left neck, under his jaw. The patient reports is a chronic finding and has had this for many years.  Cardiovascular: S1, S2, regular rate and rhythm  Respiratory: Clear to auscultation bilaterally  Abdomen: Soft, mildly tender diffusely, positive bowel sounds, right-sided urostomy in place without any evidence of local infection  Skin: No rashes  Musculoskeletal: One plus edema in lower extremities bilaterally, scrotal edema is also present with erythema of the scrotum and tenderness  Psychiatric: Anxious, appears depressed  Neurologic: Grossly intact, nonfocal, generally weak.  Labs on Admission:  Basic Metabolic Panel:  Recent Labs Lab 10/23/12 2011 10/25/12 1333 10/29/12 0810  NA  131* 134 131*  K 3.7 3.8 3.0*  CL 94* 95* 97  CO2 28 30 28   GLUCOSE 106* 94 82  BUN 27* 19 18  CREATININE 1.17 0.9 0.76  CALCIUM 9.5 9.1 8.4   Liver Function Tests:  Recent Labs Lab 10/25/12 1333 10/29/12 0810  AST 44* 116*  ALT 30 64*  ALKPHOS 369* 316*  BILITOT 0.70 1.2  PROT 6.6 5.6*  ALBUMIN  --  2.0*   No results found for this basename: LIPASE, AMYLASE,  in the last 168 hours No results found for this basename: AMMONIA,  in the last 168 hours CBC:  Recent Labs Lab 10/23/12 2011 10/25/12 1333 10/29/12 0810  WBC 12.4* 12.2* 8.4  NEUTROABS 11.4* 10.4* 8.1*  HGB 10.4* 10.8* 9.5*  HCT 32.2* 33.3* 29.8*  MCV 77.0* 77* 77.0*  PLT 332 327 184   Cardiac Enzymes:  Recent Labs Lab 10/29/12 0810  TROPONINI <0.30    BNP (last 3 results)  Recent Labs  10/29/12 0810  PROBNP 1096.0*   CBG: No results found for this basename: GLUCAP,  in  the last 168 hours  Radiological Exams on Admission: Dg Chest 1 View  10/29/2012   *RADIOLOGY REPORT*  Clinical Data: Chest pain, shortness of breath  CHEST - 1 VIEW  Comparison: 10/23/2012  Findings: Cardiomediastinal silhouette is stable.  Left subclavian Port-A-Cath with tip in SVC is unchanged in position.  No acute infiltrate or pulmonary edema.  Again noted  small pulmonary nodules left lung base the largest measures 7 mm.  IMPRESSION:   Left subclavian Port-A-Cath with tip in SVC is unchanged in position.  No acute infiltrate or pulmonary edema.  Again noted small pulmonary nodules left lung base the largest measures 7 mm.   Original Report Authenticated By: Natasha Mead, M.D.   US Scrotum  10/29/2012   *RADIOLOGY REPORT*  Clinical Data:  Swelling. History of penile cancer.  SCROTAL ULTRASOUND DOPPLER ULTRASOUND OF THE TESTICLES  Technique: Complete ultrasound examination of the testicles, epididymis, and other scrotal structures was performed.  Color and spectral Doppler ultrasound were also utilized to evaluate blood flow to  the testicles.  Comparison:  None  Findings:  Right testis:  Normal appearance of the right testicle.  Testicle measures 3.6 x 2.7 x 2.5 cm. There is extensive soft tissue swelling in the scrotum.  There is some heterogeneous material surrounding the testicle.  Left testis:  Normal appearance of the left testicle.  Left testicle measures 3.2 x 2.5 x 2.2 cm.  Similar to the right side, there is heterogeneous material surrounding the left testicle.  Right epididymis:  Not well visualized.  Left epididymis:  Not well visualized.  Hydrocele:  Absent  Varicocele:  Evidence for a left sided varicocele.  Vessel measure up to 3 mm.  Pulsed Doppler interrogation of both testes demonstrates low resistance flow bilaterally.  IMPRESSION: There appears to be diffuse scrotal swelling.  In addition, there is heterogeneous material surrounding the testicles bilaterally, left side greater than right.  These findings are nonspecific but could be related to an inflammatory or infectious process.  Normal appearance of both testicles.  No evidence for testicular torsion.  Left varicocele.   Original Report Authenticated By: Richarda Overlie, M.D.   [place] Art/ven Flow Abd Pelv Doppler  10/29/2012   *RADIOLOGY REPORT*  Clinical Data:  Swelling. History of penile cancer.  SCROTAL ULTRASOUND DOPPLER ULTRASOUND OF THE TESTICLES  Technique: Complete ultrasound examination of the testicles, epididymis, and other scrotal structures was performed.  Color and spectral Doppler ultrasound were also utilized to evaluate blood flow to the testicles.  Comparison:  None  Findings:  Right testis:  Normal appearance of the right testicle.  Testicle measures 3.6 x 2.7 x 2.5 cm. There is extensive soft tissue swelling in the scrotum.  There is some heterogeneous material surrounding the testicle.  Left testis:  Normal appearance of the left testicle.  Left testicle measures 3.2 x 2.5 x 2.2 cm.  Similar to the right side, there is heterogeneous material  surrounding the left testicle.  Right epididymis:  Not well visualized.  Left epididymis:  Not well visualized.  Hydrocele:  Absent  Varicocele:  Evidence for a left sided varicocele.  Vessel measure up to 3 mm.  Pulsed Doppler interrogation of both testes demonstrates low resistance flow bilaterally.  IMPRESSION: There appears to be diffuse scrotal swelling.  In addition, there is heterogeneous material surrounding the testicles bilaterally, left side greater than right.  These findings are nonspecific but could be related to an inflammatory or infectious process.  Normal appearance of both testicles.  No  evidence for testicular torsion.  Left varicocele.   Original Report Authenticated By: Richarda Overlie, M.D.    EKG: Independently reviewed. Bifascicular block, sinus rhythm.  Assessment/Plan Active Problems:   Bladder cancer   Protein-calorie malnutrition, severe   Oral candida   Anemia   Cellulitis   Hypokalemia   Positive fecal occult blood test   UTI (lower urinary tract infection)   1. Scrotal swelling. Possibly related to a cellulitis of the scrotum. He was started on antibiotics. This may also be related to anasarca and peripheral edema. He does have some edema in his lower extremities as well. This is likely related to hypoalbuminemia. He'll be started on low dose of Lasix as well. We will monitor her clinical response. Check 2-D echocardiogram 2. Anemia, multifactorial. Patient has received recent chemotherapy his anemia might be related to this. He was also found to have heme positive stools and per ER physician on rectal exam he have a rectal mass. Over the last 2 weeks his hemoglobin has dropped 3 g. He also has a microcytosis which may be related to chronic blood loss. We will consult gastroenterology for opinion. Transfuse as necessary. 3. Oral thrush. We'll give Diflucan Magic mouthwash 4. Shortness of breath. Chest x-ray unremarkable. Appears to have improved with Ativan. We'll  continue to watch for any recurrence. 5. Bladder cancer. Followup with oncology as an outpatient. 6. Urinary tract infection. Urinalysis indicates a possible infection. He was started on antibiotics. We'll followup urine culture. Check blood culture. 7. Hypokalemia. Replace   Code Status: full code Family Communication: no family present, discussed with patient Disposition Plan: pending hospital course  Time spent:  [facility] Pager [phone]  If 7PM-7AM, please contact night-coverage [email or web address] Password [facility] 10/29/2012, 7:46 PM

## 2012-10-29 NOTE — Progress Notes (Signed)
ANTIBIOTIC CONSULT NOTE - INITIAL  Pharmacy Consult for Vancomycin Indication: cellulitis  No Known Allergies  Patient Measurements: Height: 5\' 7"  (170.2 cm) Weight: 192 lb 0.3 oz (87.1 kg) IBW/kg (Calculated) : 66.1  Vital Signs: Temp: 97.8 F (36.6 C) (08/17 1433) Temp src: Oral (08/17 1433) BP: 109/59 mmHg (08/17 1433) Pulse Rate: 88 (08/17 1433) Intake/Output from previous day:   Intake/Output from this shift: Total I/O In: -  Out: 500 [Urine:500]  Labs:  Recent Labs  10/29/12 0810  WBC 8.4  HGB 9.5*  PLT 184  CREATININE 0.76   Estimated Creatinine Clearance: 107.4 ml/min (by C-G formula based on Cr of 0.76). No results found for this basename: VANCOTROUGH, VANCOPEAK, VANCORANDOM, GENTTROUGH, GENTPEAK, GENTRANDOM, TOBRATROUGH, TOBRAPEAK, TOBRARND, AMIKACINPEAK, AMIKACINTROU, AMIKACIN,  in the last 72 hours   Microbiology: No results found for this or any previous visit (from the past 720 hour(s)).  Medical History: Past Medical History  Diagnosis Date  . Gross hematuria   . C. difficile enteritis 07/04/2011  . Diverticulitis   . Subcutaneous emphysema, postoperative 01/07/2012  . Cirrhosis 01/07/2012    Radiographically  . Splenomegaly   . Bladder cancer 01/07/2012    Transitional Cell Carcinoma, s/p bladder, prostate and lymph node resection with ileal conduit at [facility] on 10/9, discharged on 10/14   . Acute pyelonephritis 01/07/2012  . Prostate cancer 08/11/2012  . Bone cancer     Assessment: Estimated Creatinine Clearance: 107.4 ml/min (by C-G formula based on Cr of 0.76).  Goal of Therapy:  Trough 10-15  Plan:  Vancomycin 1gm IV q12h Trough at steady [address], [place] A 10/29/2012,5:08 PM

## 2012-10-29 NOTE — Progress Notes (Deleted)
[facility] History and Physical  [name] RUE:[id number] DOB: [birth year] DOA: 10/29/2012  Referring physician: Dr. Rhunette Croft, ER physician PCP: Pcp Not In System  Specialists: Oncologist: Dr. Myna Hidalgo  Chief Complaint: scrotal edema, difficulty breathing  HPI: [name] is a 57 y.o. 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 The patient has had cystectomy with urostomy placement. He was recently started on chemotherapy approximately one week ago. He reports that since being started on chemotherapy, he has felt very poorly. He had noticed worsening swelling, tenderness in his scrotum. He also posterior of breath for the past several days. He's been coughing up white-colored sputum. He is difficulty swallowing due to pain. He has chest pain which is worse with coughing. He's generally weak. He reports having frequent diarrhea since being started on chemotherapy. He's also had nausea and vomiting. Patient reports feeling febrile. He denies any frank melanotic, hematochezia. He does not take NSAIDs. Since he has no symptoms post chemotherapy, he has not contacted his oncologist. He came to the emergency room today when he felt the symptoms were getting worse. He was evaluated in the emergency room due to multiple complaints, he was referred for admission.  Review of Systems: Pertinent positives as per history of present illness, otherwise negative  Past Medical History  Diagnosis Date  . Gross hematuria   . C. difficile enteritis 07/04/2011  . Diverticulitis   . Subcutaneous emphysema, postoperative 01/07/2012  . Cirrhosis 01/07/2012    Radiographically  . Splenomegaly   . Bladder cancer 01/07/2012    Transitional Cell Carcinoma, s/p bladder, prostate and lymph node resection with ileal conduit at [facility] on 10/9, discharged on 10/14   . Acute pyelonephritis 01/07/2012  . Prostate cancer 08/11/2012  . Bone cancer    Past Surgical  History  Procedure Laterality Date  . Elbow surgery    . Transurethral resection of bladder tumor  10/26/2011    Procedure: TRANSURETHRAL RESECTION OF BLADDER TUMOR (TURBT);  Surgeon: Ky Barban, MD;  Location: [facility];  Service: Urology;  Laterality: N/A;  . Transurethral resection of prostate  10/26/2011    Procedure: TRANSURETHRAL RESECTION OF THE PROSTATE (TURP);  Surgeon: Ky Barban, MD;  Location: [facility];  Service: Urology;  Laterality: N/A;  . Inguinal hernia repair Left 08/17/2012    Procedure: Left inguinal lymph node excision;  Surgeon: Lodema Pilot, DO;  Location: [facility];  Service: General;  Laterality: Left;  . Portacath placement N/A 08/23/2012    Procedure: INSERTION PORT-A-CATH;  Surgeon: Kandis Cocking, MD;  Location: [facility];  Service: General;  Laterality: N/A;  . Back surgery     Social History:  reports that he quit smoking about 4 years ago. He has never used smokeless tobacco. He reports that he does not drink alcohol or use illicit drugs.   No Known Allergies  Family History  Problem Relation Age of Onset  . Heart failure Father   . Hypertension Mother      Prior to Admission medications   Medication Sig Start Date End Date Taking? Authorizing Provider  ALPRAZolam Prudy Feeler) 0.5 MG tablet Take one every 8 hours for nerves.  Take one or 2 at bedtime as needed 10/23/12  Yes [name], MD  dexamethasone (DECADRON) 4 MG tablet Take 2 tablets (8 mg total) by mouth 2 (two) times daily with a meal. Take two times a day starting the day after chemo for 3 days. 10/25/12  Yes Josph Macho, MD  fentaNYL (DURAGESIC - DOSED MCG/HR) 50 MCG/HR Place 1 patch onto the skin every 3 (three) days.   Yes Historical Provider, MD  HYDROcodone-acetaminophen (NORCO/VICODIN) 5-325 MG per tablet Take 1 tablet by mouth every 6 (six) hours as needed for pain. 10/09/12  Yes Josph Macho, MD  lidocaine-prilocaine (EMLA) cream Apply topically as needed. Apply to port one hour before  chemo treatment is to start. 10/25/12  Yes Josph Macho, MD  LORazepam (ATIVAN) 0.5 MG tablet Take 1 tablet (0.5 mg total) by mouth every 6 (six) hours as needed for anxiety (nausea and vomiting). 10/25/12  Yes Josph Macho, MD  megestrol (MEGACE ES) 625 MG/5ML suspension Take 5 mLs (625 mg total) by mouth daily. 10/09/12  Yes Josph Macho, MD  mirtazapine (REMERON) 15 MG tablet Take 15 mg by mouth at bedtime.   Yes Historical Provider, MD  Multiple Vitamin (MULTIVITAMIN WITH MINERALS) TABS tablet Take 1 tablet by mouth daily.   Yes Historical Provider, MD  ondansetron (ZOFRAN) 8 MG tablet Take 1 tabs twice daily starting the day after chemo for 3 days, then twice daily as needed for nausea and vomiting. 10/25/12  Yes Josph Macho, MD  prochlorperazine (COMPAZINE) 10 MG tablet Take 1 tablet (10 mg total) by mouth every 6 (six) hours as needed. 10/25/12  Yes Josph Macho, MD   Physical Exam: Filed Vitals:   10/29/12 1433  BP: 109/59  Pulse: 88  Temp: 97.8 F (36.6 C)  Resp: 16     General:  No acute distress, appears to be chronically ill  Eyes: Pupils are equal round react to light  ENT: Oral thrush is present  Neck: Supple, large circumscribed, circular protuberance in patient's left neck, under his jaw. The patient reports is a chronic finding and has had this for many years.  Cardiovascular: S1, S2, regular rate and rhythm  Respiratory: Clear to auscultation bilaterally  Abdomen: Soft, mildly tender diffusely, positive bowel sounds, right-sided urostomy in place without any evidence of local infection  Skin: No rashes  Musculoskeletal: One plus edema in lower extremities bilaterally, scrotal edema is also present with erythema of the scrotum and tenderness  Psychiatric: Anxious, appears depressed  Neurologic: Grossly intact, nonfocal, generally weak.  Labs on Admission:  Basic Metabolic Panel:  Recent Labs Lab 10/23/12 2011 10/25/12 1333 10/29/12 0810  NA  131* 134 131*  K 3.7 3.8 3.0*  CL 94* 95* 97  CO2 28 30 28   GLUCOSE 106* 94 82  BUN 27* 19 18  CREATININE 1.17 0.9 0.76  CALCIUM 9.5 9.1 8.4   Liver Function Tests:  Recent Labs Lab 10/25/12 1333 10/29/12 0810  AST 44* 116*  ALT 30 64*  ALKPHOS 369* 316*  BILITOT 0.70 1.2  PROT 6.6 5.6*  ALBUMIN  --  2.0*   No results found for this basename: LIPASE, AMYLASE,  in the last 168 hours No results found for this basename: AMMONIA,  in the last 168 hours CBC:  Recent Labs Lab 10/23/12 2011 10/25/12 1333 10/29/12 0810  WBC 12.4* 12.2* 8.4  NEUTROABS 11.4* 10.4* 8.1*  HGB 10.4* 10.8* 9.5*  HCT 32.2* 33.3* 29.8*  MCV 77.0* 77* 77.0*  PLT 332 327 184   Cardiac Enzymes:  Recent Labs Lab 10/29/12 0810  TROPONINI <0.30    BNP (last 3 results) No results found for this basename: PROBNP,  in the last 8760 hours CBG: No results found for this basename: GLUCAP,  in the last 168 hours  Radiological Exams on Admission: Dg Chest 1 View  10/29/2012   *RADIOLOGY REPORT*  Clinical Data: Chest pain, shortness of breath  CHEST - 1 VIEW  Comparison: 10/23/2012  Findings: Cardiomediastinal silhouette is stable.  Left subclavian Port-A-Cath with tip in SVC is unchanged in position.  No acute infiltrate or pulmonary edema.  Again noted  small pulmonary nodules left lung base the largest measures 7 mm.  IMPRESSION:   Left subclavian Port-A-Cath with tip in SVC is unchanged in position.  No acute infiltrate or pulmonary edema.  Again noted small pulmonary nodules left lung base the largest measures 7 mm.   Original Report Authenticated By: Natasha Mead, M.D.   US Scrotum  10/29/2012   *RADIOLOGY REPORT*  Clinical Data:  Swelling. History of penile cancer.  SCROTAL ULTRASOUND DOPPLER ULTRASOUND OF THE TESTICLES  Technique: Complete ultrasound examination of the testicles, epididymis, and other scrotal structures was performed.  Color and spectral Doppler ultrasound were also utilized to evaluate  blood flow to the testicles.  Comparison:  None  Findings:  Right testis:  Normal appearance of the right testicle.  Testicle measures 3.6 x 2.7 x 2.5 cm. There is extensive soft tissue swelling in the scrotum.  There is some heterogeneous material surrounding the testicle.  Left testis:  Normal appearance of the left testicle.  Left testicle measures 3.2 x 2.5 x 2.2 cm.  Similar to the right side, there is heterogeneous material surrounding the left testicle.  Right epididymis:  Not well visualized.  Left epididymis:  Not well visualized.  Hydrocele:  Absent  Varicocele:  Evidence for a left sided varicocele.  Vessel measure up to 3 mm.  Pulsed Doppler interrogation of both testes demonstrates low resistance flow bilaterally.  IMPRESSION: There appears to be diffuse scrotal swelling.  In addition, there is heterogeneous material surrounding the testicles bilaterally, left side greater than right.  These findings are nonspecific but could be related to an inflammatory or infectious process.  Normal appearance of both testicles.  No evidence for testicular torsion.  Left varicocele.   Original Report Authenticated By: Richarda Overlie, M.D.   [place] Art/ven Flow Abd Pelv Doppler  10/29/2012   *RADIOLOGY REPORT*  Clinical Data:  Swelling. History of penile cancer.  SCROTAL ULTRASOUND DOPPLER ULTRASOUND OF THE TESTICLES  Technique: Complete ultrasound examination of the testicles, epididymis, and other scrotal structures was performed.  Color and spectral Doppler ultrasound were also utilized to evaluate blood flow to the testicles.  Comparison:  None  Findings:  Right testis:  Normal appearance of the right testicle.  Testicle measures 3.6 x 2.7 x 2.5 cm. There is extensive soft tissue swelling in the scrotum.  There is some heterogeneous material surrounding the testicle.  Left testis:  Normal appearance of the left testicle.  Left testicle measures 3.2 x 2.5 x 2.2 cm.  Similar to the right side, there is heterogeneous  material surrounding the left testicle.  Right epididymis:  Not well visualized.  Left epididymis:  Not well visualized.  Hydrocele:  Absent  Varicocele:  Evidence for a left sided varicocele.  Vessel measure up to 3 mm.  Pulsed Doppler interrogation of both testes demonstrates low resistance flow bilaterally.  IMPRESSION: There appears to be diffuse scrotal swelling.  In addition, there is heterogeneous material surrounding the testicles bilaterally, left side greater than right.  These findings are nonspecific but could be related to an inflammatory or infectious process.  Normal appearance of both testicles.  No evidence for testicular torsion.  Left varicocele.   Original Report Authenticated By: Richarda Overlie, M.D.    EKG: Independently reviewed. Bifascicular block, sinus rhythm.  Assessment/Plan Active Problems:   Bladder cancer   Protein-calorie malnutrition, severe   Oral candida   Anemia   Cellulitis   Hypokalemia   Positive fecal occult blood test   UTI (lower urinary tract infection)   1. Scrotal swelling. Possibly related to a cellulitis of the scrotum. He was started on antibiotics. This may also be related to anasarca and peripheral edema. He does have some edema in his lower extremities as well. This is likely related to hypoalbuminemia. He'll be started on low dose of Lasix as well. We will monitor her clinical response. Check 2-D echocardiogram 2. Anemia, multifactorial. Patient has received recent chemotherapy his anemia might be related to this. He was also found to have heme positive stools and per ER physician on rectal exam he have a rectal mass. Over the last 2 weeks his hemoglobin has dropped 3 g. He also has a microcytosis which may be related to chronic blood loss. We will consult gastroenterology for opinion. Transfuse as necessary. 3. Oral thrush. We'll give Diflucan Magic mouthwash 4. Shortness of breath. Chest x-ray unremarkable. Appears to have improved with Ativan.  We'll continue to watch for any recurrence. 5. Bladder cancer. Followup with oncology as an outpatient. 6. Urinary tract infection. Urinalysis indicates a possible infection. He was started on antibiotics. We'll followup urine culture. Check blood culture. 7. Hypokalemia. Replace   Code Status: full code Family Communication: no family present, discussed with patient Disposition Plan: pending hospital course  Time spent:  [facility] Pager [phone]  If 7PM-7AM, please contact night-coverage [email or web address] Password [email or web address] 10/29/2012, 4:11 PM

## 2012-10-29 NOTE — ED Notes (Signed)
Pt to department via EMS.  Per report, call was for SOB.  Pt reporting abdominal pain which is making it difficult to breath.  States "My intestines are swelled up real bad which makes it hurt."  Pt has history of anxiety and was very anxious upon arrival.  When patient calmer, VS stable, respiratory rate regular and O2 Sats 100%.

## 2012-10-29 NOTE — ED Notes (Signed)
Report called to Jessica, RN on unit 300 

## 2012-10-29 NOTE — Progress Notes (Signed)
Urostomy bag (cut to 1 1/4in) placed. Previous bag was leaking around stoma.  Stoma is red and moist above skin level.

## 2012-10-30 ENCOUNTER — Encounter (HOSPITAL_COMMUNITY): Payer: Self-pay | Admitting: Gastroenterology

## 2012-10-30 DIAGNOSIS — R198 Other specified symptoms and signs involving the digestive system and abdomen: Secondary | ICD-10-CM

## 2012-10-30 DIAGNOSIS — K7460 Unspecified cirrhosis of liver: Secondary | ICD-10-CM

## 2012-10-30 DIAGNOSIS — D649 Anemia, unspecified: Secondary | ICD-10-CM

## 2012-10-30 DIAGNOSIS — I517 Cardiomegaly: Secondary | ICD-10-CM

## 2012-10-30 DIAGNOSIS — R195 Other fecal abnormalities: Secondary | ICD-10-CM

## 2012-10-30 DIAGNOSIS — R1310 Dysphagia, unspecified: Secondary | ICD-10-CM

## 2012-10-30 LAB — COMPREHENSIVE METABOLIC PANEL
ALT: 57 U/L — ABNORMAL HIGH (ref 0–53)
Alkaline Phosphatase: 312 U/L — ABNORMAL HIGH (ref 39–117)
CO2: 30 mEq/L (ref 19–32)
Calcium: 8.3 mg/dL — ABNORMAL LOW (ref 8.4–10.5)
GFR calc Af Amer: >90 mL/min (ref >90)
GFR calc non Af Amer: >90 mL/min (ref >90)
Glucose, Bld: 82 mg/dL (ref 70–99)
Sodium: 136 mEq/L (ref 135–145)

## 2012-10-30 LAB — CBC
MCHC: 32.2 g/dL (ref 30.0–36.0)
Platelets: 173 10*3/uL (ref 150–400)
RDW: 16.4 % — ABNORMAL HIGH (ref 11.5–15.5)

## 2012-10-30 LAB — TSH: TSH: 0.892 u[IU]/mL (ref 0.350–4.500)

## 2012-10-30 NOTE — Progress Notes (Signed)
UR Chart Review Completed  

## 2012-10-30 NOTE — Consult Note (Signed)
Referring Provider: Dr. Kerry Hough Primary Care Physician:  Pcp Not In System Primary Gastroenterologist:  Dr. 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 He has undergone palliative radiation therapy.  PET scan performed at [facility] June 2014 showed widespread disease with intrathoracic disease, lymphadenopathy, pulmonary disease, liver metastasis, abdominal retroperitoneal involvement. Presented to ED on August 17th with multiple coplaints to include odynophagia, cough, frequent diarrhea, nausea, vomiting. Per ED notes, on digital rectal exam, a rectal mass was appreciated. He was also found to have oral thrush. Heme positive. Hgb drop of about 3 grams since late July.   Went through ten days of palliative radiation, completed. One round of chemo, which he did not tolerate. No prior colonoscopy. Odynophagia improved since starting mouthwash and Diflucan. Poor historian and difficult to obtain a complete history. Loose stools started a few days ago. No rectal bleeding. Had one loose stool yesterday. Noted lower abdominal discomfort yesterday, now resolved. No melena. Difficult to obtain information regarding prior baseline status before admission; it is unclear if he dealt with odynophagia/dysphagia before. Does not appear he was on a PPI prior to admission.   Past Medical History  Diagnosis Date  . Gross hematuria   . C. difficile enteritis 07/04/2011  . Diverticulitis   . Subcutaneous emphysema, postoperative 01/07/2012  . Cirrhosis 01/07/2012    Radiographically  . Splenomegaly   . Bladder cancer 01/07/2012    Transitional Cell Carcinoma, s/p bladder, prostate and lymph node resection with ileal conduit at [facility] on 10/9, discharged on 10/14    . Acute pyelonephritis 01/07/2012  . Prostate cancer 08/11/2012  . Bone cancer     Past Surgical History  Procedure Laterality Date  . Elbow surgery    . Transurethral resection of bladder tumor  10/26/2011    Procedure: TRANSURETHRAL RESECTION OF BLADDER TUMOR (TURBT);  Surgeon: Ky Barban, MD;  Location: [facility];  Service: Urology;  Laterality: N/A;  . Transurethral resection of prostate  10/26/2011    Procedure: TRANSURETHRAL RESECTION OF THE PROSTATE (TURP);  Surgeon: Ky Barban, MD;  Location: [facility];  Service: Urology;  Laterality: N/A;  . Inguinal hernia repair Left 08/17/2012    Procedure: Left inguinal lymph node excision;  Surgeon: Lodema Pilot, DO;  Location: [facility];  Service: General;  Laterality: Left;  . Portacath placement N/A 08/23/2012    Procedure: INSERTION PORT-A-CATH;  Surgeon: Kandis Cocking, MD;  Location: [facility];  Service: General;  Laterality: N/A;  . Back surgery      Prior to Admission medications   Medication Sig Start Date End Date Taking? Authorizing Provider  ALPRAZolam Prudy Feeler) 0.5 MG tablet Take one every 8 hours for nerves.  Take one or 2 at bedtime as needed 10/23/12  Yes [name], MD  dexamethasone (DECADRON) 4 MG tablet Take 2 tablets (8 mg total) by mouth 2 (two) times daily with a meal. Take two times a day starting the day after chemo for 3 days. 10/25/12  Yes Josph Macho, MD  fentaNYL (DURAGESIC - DOSED MCG/HR) 50 MCG/HR Place 1 patch onto the skin every 3 (three) days.   Yes Historical Provider, MD  HYDROcodone-acetaminophen (NORCO/VICODIN) 5-325 MG per tablet Take 1 tablet by mouth every 6 (six)  hours as needed for pain. 10/09/12  Yes Josph Macho, MD  lidocaine-prilocaine (EMLA) cream Apply topically as needed. Apply to port one hour before chemo treatment is to start. 10/25/12  Yes Josph Macho, MD  LORazepam (ATIVAN) 0.5 MG tablet Take 1 tablet (0.5 mg total) by mouth every 6 (six) hours as needed for anxiety (nausea and  vomiting). 10/25/12  Yes Josph Macho, MD  megestrol (MEGACE ES) 625 MG/5ML suspension Take 5 mLs (625 mg total) by mouth daily. 10/09/12  Yes Josph Macho, MD  mirtazapine (REMERON) 15 MG tablet Take 15 mg by mouth at bedtime.   Yes Historical Provider, MD  Multiple Vitamin (MULTIVITAMIN WITH MINERALS) TABS tablet Take 1 tablet by mouth daily.   Yes Historical Provider, MD  ondansetron (ZOFRAN) 8 MG tablet Take 1 tabs twice daily starting the day after chemo for 3 days, then twice daily as needed for nausea and vomiting. 10/25/12  Yes Josph Macho, MD  prochlorperazine (COMPAZINE) 10 MG tablet Take 1 tablet (10 mg total) by mouth every 6 (six) hours as needed. 10/25/12  Yes Josph Macho, MD    Current Facility-Administered Medications  Medication Dose Route Frequency Provider Last Rate Last Dose  . 0.9 %  sodium chloride infusion  250 mL Intravenous PRN Erick Blinks, MD      . acetaminophen (TYLENOL) tablet 650 mg  650 mg Oral Q6H PRN Erick Blinks, MD       Or  . acetaminophen (TYLENOL) suppository 650 mg  650 mg Rectal Q6H PRN Erick Blinks, MD      . ALPRAZolam Prudy Feeler) tablet 0.5 mg  0.5 mg Oral TID Erick Blinks, MD   0.5 mg at 10/30/12 0819  . antiseptic oral rinse (BIOTENE) solution 15 mL  15 mL Mouth Rinse BID Erick Blinks, MD   15 mL at 10/30/12 0800  . cefTRIAXone (ROCEPHIN) 1 g in dextrose 5 % 50 mL IVPB  1 g Intravenous Q24H Erick Blinks, MD      . fentaNYL (DURAGESIC - dosed mcg/hr) 50 mcg  50 mcg Transdermal Q72H Erick Blinks, MD   50 mcg at 10/29/12 1656  . fluconazole (DIFLUCAN) IVPB 100 mg  100 mg Intravenous Q24H Erick Blinks, MD      . furosemide (LASIX) injection 20 mg  20 mg Intravenous BID Erick Blinks, MD   20 mg at 10/30/12 0819  . HYDROcodone-acetaminophen (NORCO/VICODIN) 5-325 MG per tablet 1 tablet  1 tablet Oral Q6H PRN Erick Blinks, MD   1 tablet at 10/30/12 0823  . LORazepam (ATIVAN) tablet 0.5 mg  0.5 mg Oral Q6H PRN Erick Blinks, MD       . magic mouthwash  5 mL Oral QID Erick Blinks, MD   5 mL at 10/30/12 0818  . megestrol (MEGACE) 400 MG/10ML suspension 400 mg  400 mg Oral Daily Erick Blinks, MD   400 mg at 10/30/12 0819  . mirtazapine (REMERON) tablet 15 mg  15 mg Oral QHS Erick Blinks, MD   15 mg at 10/29/12 2128  . ondansetron (ZOFRAN) tablet 4 mg  4 mg Oral Q6H PRN Erick Blinks, MD       Or  . ondansetron (ZOFRAN) injection 4 mg  4 mg Intravenous Q6H PRN Erick Blinks, MD      . pantoprazole (PROTONIX) injection 40 mg  40 mg Intravenous Q24H Erick Blinks, MD   40 mg at 10/29/12 1656  . sodium chloride 0.9 % injection 3 mL  3 mL  Intravenous Q12H Erick Blinks, MD      . sodium chloride 0.9 % injection 3 mL  3 mL Intravenous Q12H Erick Blinks, MD      . sodium chloride 0.9 % injection 3 mL  3 mL Intravenous PRN Erick Blinks, MD      . vancomycin (VANCOCIN) IVPB 1000 mg/200 mL premix  1,000 mg Intravenous Q12H Erick Blinks, MD   1,000 mg at 10/30/12 0853    Allergies as of 10/29/2012  . (No Known Allergies)    Family History  Problem Relation Age of Onset  . Heart failure Father   . Hypertension Mother   . Colon cancer Neg Hx     History   Social History  . Marital Status: Married    Spouse Name: N/A    Number of Children: N/A  . Years of Education: N/A   Occupational History  . construction     unemployed since 2008   Social History Main Topics  . Smoking status: Former Smoker    Quit date: 12/03/2007  . Smokeless tobacco: Never Used  . Alcohol Use: No  . Drug Use: No  . Sexual Activity: Yes   Other Topics Concern  . Not on file   Social History Narrative  . No narrative on file    Review of Systems: Gen: +fatigue and weight loss CV: Denies chest pain, heart palpitations, syncope, edema  Resp: +cough, SOB GI: see HPI  GU : urostomy  MS: joint pain Derm: Denies rash, itching, dry skin  Physical Exam: Vital signs in last 24 hours: Temp:  [97.8 F (36.6 C)-98.2 F  (36.8 C)] 98 F (36.7 C) (08/18 0647) Pulse Rate:  [88-109] 109 (08/18 0647) Resp:  [7-18] 18 (08/18 0647) BP: (99-115)/(52-73) 115/73 mmHg (08/18 0647) SpO2:  [96 %-98 %] 96 % (08/18 0647) Weight:  [192 lb (87.091 kg)-192 lb 0.3 oz (87.1 kg)] 192 lb (87.091 kg) (08/18 0500) Last BM Date: 10/28/12 General:   Alert,  Thin, appears chronically ill  Head:  Normocephalic and atraumatic. Eyes:  Sclera clear, no icterus.   Conjunctiva pink. Ears:  Normal auditory acuity. Nose:  No deformity, discharge,  or lesions. Mouth:  Without evidence of further oral thrush Neck:  Supple; walnut-sized lipoma-like structure at neck, below left jaw Lungs:  Clear throughout to auscultation.   No wheezes, crackles, or rhonchi. No acute distress. Heart:  Regular rate and rhythm; no murmurs, clicks, rubs,  or gallops. Abdomen:  Soft, nontender and nondistended. Normal bowel sounds. Right-sided urostomy in place, stoma pink Rectal:  External exam benign, internal exam with firm protrusion, smooth, marble-sized, fixated at about 2 oclock. . Otherwise, rectal vault without obstructive mass or further abnormal findings.  Msk:  Symmetrical without gross deformities. Normal posture. Extremities:  Trace edema bilateral lower extremities.  Neurologic:  Alert and  oriented x4;  grossly normal neurologically. Skin:  Intact without significant lesions or rashes. Psych:  Alert and cooperative. Normal mood and affect.  Intake/Output from previous day: 08/17 0701 - 08/18 0700 In: 240 [P.O.:240] Out: 1500 [Urine:1500] Intake/Output this shift:    Lab Results:  Recent Labs  10/29/12 0810 10/30/12 0541  WBC 8.4 5.6  HGB 9.5* 9.2*  HCT 29.8* 28.6*  PLT 184 173   BMET  Recent Labs  10/29/12 0810 10/30/12 0541  NA 131* 136  K 3.0* 4.1  CL 97 101  CO2 28 30  GLUCOSE 82 82  BUN 18 15  CREATININE 0.76 0.79  CALCIUM 8.4  8.3*   LFT  Recent Labs  10/29/12 0810 10/30/12 0541  PROT 5.6* 5.4*  ALBUMIN  2.0* 1.7*  AST 116* 83*  ALT 64* 57*  ALKPHOS 316* 312*  BILITOT 1.2 0.6    Studies/Results: Dg Chest 1 View  10/29/2012   *RADIOLOGY REPORT*  Clinical Data: Chest pain, shortness of breath  CHEST - 1 VIEW  Comparison: 10/23/2012  Findings: Cardiomediastinal silhouette is stable.  Left subclavian Port-A-Cath with tip in SVC is unchanged in position.  No acute infiltrate or pulmonary edema.  Again noted  small pulmonary nodules left lung base the largest measures 7 mm.  IMPRESSION:   Left subclavian Port-A-Cath with tip in SVC is unchanged in position.  No acute infiltrate or pulmonary edema.  Again noted small pulmonary nodules left lung base the largest measures 7 mm.   Original Report Authenticated By: Natasha Mead, M.D.   US Scrotum  10/29/2012   *RADIOLOGY REPORT*  Clinical Data:  Swelling. History of penile cancer.  SCROTAL ULTRASOUND DOPPLER ULTRASOUND OF THE TESTICLES  Technique: Complete ultrasound examination of the testicles, epididymis, and other scrotal structures was performed.  Color and spectral Doppler ultrasound were also utilized to evaluate blood flow to the testicles.  Comparison:  None  Findings:  Right testis:  Normal appearance of the right testicle.  Testicle measures 3.6 x 2.7 x 2.5 cm. There is extensive soft tissue swelling in the scrotum.  There is some heterogeneous material surrounding the testicle.  Left testis:  Normal appearance of the left testicle.  Left testicle measures 3.2 x 2.5 x 2.2 cm.  Similar to the right side, there is heterogeneous material surrounding the left testicle.  Right epididymis:  Not well visualized.  Left epididymis:  Not well visualized.  Hydrocele:  Absent  Varicocele:  Evidence for a left sided varicocele.  Vessel measure up to 3 mm.  Pulsed Doppler interrogation of both testes demonstrates low resistance flow bilaterally.  IMPRESSION: There appears to be diffuse scrotal swelling.  In addition, there is heterogeneous material surrounding the  testicles bilaterally, left side greater than right.  These findings are nonspecific but could be related to an inflammatory or infectious process.  Normal appearance of both testicles.  No evidence for testicular torsion.  Left varicocele.   Original Report Authenticated By: Richarda Overlie, M.D.   [place] Art/ven Flow Abd Pelv Doppler  10/29/2012   *RADIOLOGY REPORT*  Clinical Data:  Swelling. History of penile cancer.  SCROTAL ULTRASOUND DOPPLER ULTRASOUND OF THE TESTICLES  Technique: Complete ultrasound examination of the testicles, epididymis, and other scrotal structures was performed.  Color and spectral Doppler ultrasound were also utilized to evaluate blood flow to the testicles.  Comparison:  None  Findings:  Right testis:  Normal appearance of the right testicle.  Testicle measures 3.6 x 2.7 x 2.5 cm. There is extensive soft tissue swelling in the scrotum.  There is some heterogeneous material surrounding the testicle.  Left testis:  Normal appearance of the left testicle.  Left testicle measures 3.2 x 2.5 x 2.2 cm.  Similar to the right side, there is heterogeneous material surrounding the left testicle.  Right epididymis:  Not well visualized.  Left epididymis:  Not well visualized.  Hydrocele:  Absent  Varicocele:  Evidence for a left sided varicocele.  Vessel measure up to 3 mm.  Pulsed Doppler interrogation of both testes demonstrates low resistance flow bilaterally.  IMPRESSION: There appears to be diffuse scrotal swelling.  In addition, there is heterogeneous material surrounding  the testicles bilaterally, left side greater than right.  These findings are nonspecific but could be related to an inflammatory or infectious process.  Normal appearance of both testicles.  No evidence for testicular torsion.  Left varicocele.   Original Report Authenticated By: Richarda Overlie, M.D.    Impression: 57 year old admitted with multiple complaints, history of metastatic bladder cancer. From a GI standpoint, he was  noted to have oral thrush on exam at admission; odynophagia significantly improved/resolved since starting Diflucan. He denies any further issues with this at time of consultation. Hold off on upper EGD unless recurrent or persistent dysphagia/odynophagia. Add PPI.   Questionable rectal mass: rectal exam revealed small, firm protrusion, marble-sized, without obstructive mass noted. No prior colonoscopy. With his poor prognosis, further evaluation of this may need to be deferred for now. He has no rectal bleeding, although he does note several days of loose stool. Review of epic reveals a history of Cdiff last year. Will obtain stool studies to rule out infectious process.  Anemia: likely multifactorial in the setting of recent radiation/chemo, and cancer, without overt GI bleeding. No prior colonoscopy or upper endoscopy. Heme positive. Due to patient's current health status, hold off on colonoscopy or upper endoscopy unless signs of overt GI bleeding.   Cirrhosis: unclear etiology, with elevated LFTs in the setting of metastatic disease to the liver. No need for further work-up of cirrhosis. Continue to monitor LFTs.   Originally, patient had relayed concern and desire to proceed with a colonoscopy. However, after speaking with family, he does not want to pursue invasive measures at this time, which is quite reasonable.   Plan: Agree with PPI Monitor for overt signs of GI bleeding Continue Diflucan EGD if recurrent dysphagia/odynophagia Hold off on colonoscopy at this time Obtain stool studies Supportive measures  Nira Retort, ANP-BC [facility]  9: 45 am   LOS: 1 day    10/30/2012,

## 2012-10-30 NOTE — Progress Notes (Signed)
[facility] PROGRESS NOTE  [name] [facility] RUE:[id number] DOB: 1955/08/01 DOA: 10/29/2012 PCP: Pcp Not In System  Assessment/Plan: 1. Scrotal edema. Continue current antibiotics for cellulitis. Appears to be improving with diuresis as well. Echo has been ordered to evaluate LV function, although I suspect it may be related to hypoalbuminemia. Continue current dose of Lasix. 2. Anemia, likely multifactorial. Appreciate GI assistance. Defer further plans for any endoscopic evaluation to GI. 3. Oral thrush. Improving with Magic mouthwash and Diflucan. 4. Shortness of breath. Appears to have improved. 5. Stage IV bladder cancer. Patient wishes to not pursue any further chemotherapy and in is leaning towards the hospice route. I have advised him to follow up with his oncologist prior to making this decision and discuss this further.  6. urinary tract infection, gram-negative rods. Continue Rocephin for now. Followup sensitivities 7. Hypokalemia. Improved  Code Status: Full code Family Communication: Discussed with patient Disposition Plan: Discharge home, possibly next 24-48 hours   Consultants:  Gastroenterology  Procedures:  None  Antibiotics:  Vancomycin, 8/17  Rocephin, 8/17  HPI/Subjective: Feeling better today. Swallowing better today. Feels that swelling in scrotum is improving.  Objective: Filed Vitals:   10/30/12 0647  BP: 115/73  Pulse: 109  Temp: 98 F (36.7 C)  Resp: 18    Intake/Output Summary (Last 24 hours) at 10/30/12 2009 Last data filed at 10/30/12 0600  Gross per 24 hour  Intake      0 ml  Output   1000 ml  Net  -1000 ml   Filed Weights   10/29/12 0653 10/29/12 1433 10/30/12 0500  Weight: 81.194 kg (179 lb) 87.1 kg (192 lb 0.3 oz) 87.091 kg (192 lb)    Exam:   General:  NAD, oral thrush is improving  Cardiovascular: S1, S2 RRR  Respiratory: CTA B  Abdomen: soft, nt, nd, bs+  Musculoskeletal: 1+ edema present in thighs, scrotal  edema is improving, erythema still present   Data Reviewed: Basic Metabolic Panel:  Recent Labs Lab 10/23/12 2011 10/25/12 1333 10/29/12 0810 10/30/12 0541  NA 131* 134 131* 136  K 3.7 3.8 3.0* 4.1  CL 94* 95* 97 101  CO2 28 30 28 30   GLUCOSE 106* 94 82 82  BUN 27* 19 18 15   CREATININE 1.17 0.9 0.76 0.79  CALCIUM 9.5 9.1 8.4 8.3*  MG  --   --   --  1.9   Liver Function Tests:  Recent Labs Lab 10/25/12 1333 10/29/12 0810 10/30/12 0541  AST 44* 116* 83*  ALT 30 64* 57*  ALKPHOS 369* 316* 312*  BILITOT 0.70 1.2 0.6  PROT 6.6 5.6* 5.4*  ALBUMIN  --  2.0* 1.7*   No results found for this basename: LIPASE, AMYLASE,  in the last 168 hours No results found for this basename: AMMONIA,  in the last 168 hours CBC:  Recent Labs Lab 10/23/12 2011 10/25/12 1333 10/29/12 0810 10/30/12 0541  WBC 12.4* 12.2* 8.4 5.6  NEUTROABS 11.4* 10.4* 8.1*  --   HGB 10.4* 10.8* 9.5* 9.2*  HCT 32.2* 33.3* 29.8* 28.6*  MCV 77.0* 77* 77.0* 78.1  PLT 332 327 184 173   Cardiac Enzymes:  Recent Labs Lab 10/29/12 0810  TROPONINI <0.30   BNP (last 3 results)  Recent Labs  10/29/12 0810  PROBNP 1096.0*   CBG: No results found for this basename: GLUCAP,  in the last 168 hours  Recent Results (from the past 240 hour(s))  URINE CULTURE     Status: None  Collection Time    10/29/12  7:58 AM      Result Value Range Status   Specimen Description URINE, SUPRAPUBIC   Final   Special Requests NONE   Final   Culture  Setup Time     Final   Value: 10/29/2012 18:43     Performed at [facility] Count     Final   Value: >=100,000 COLONIES/ML     Performed at [facility]   Culture     Final   Value: GRAM NEGATIVE RODS     Performed at [facility]   Report Status PENDING   Incomplete  CULTURE, BLOOD (ROUTINE X 2)     Status: None   Collection Time    10/29/12  5:00 PM      Result Value Range Status   Specimen Description BLOOD RIGHT ARM   Final    Special Requests BOTTLES DRAWN AEROBIC AND ANAEROBIC 6CC EACH   Final   Culture NO GROWTH 1 DAY   Final   Report Status PENDING   Incomplete  CULTURE, BLOOD (ROUTINE X 2)     Status: None   Collection Time    10/29/12  5:15 PM      Result Value Range Status   Specimen Description BLOOD LEFT ARM   Final   Special Requests BOTTLES DRAWN AEROBIC AND ANAEROBIC 8CC EACH   Final   Culture NO GROWTH 1 DAY   Final   Report Status PENDING   Incomplete     Studies: Dg Chest 1 View  10/29/2012   *RADIOLOGY REPORT*  Clinical Data: Chest pain, shortness of breath  CHEST - 1 VIEW  Comparison: 10/23/2012  Findings: Cardiomediastinal silhouette is stable.  Left subclavian Port-A-Cath with tip in SVC is unchanged in position.  No acute infiltrate or pulmonary edema.  Again noted  small pulmonary nodules left lung base the largest measures 7 mm.  IMPRESSION:   Left subclavian Port-A-Cath with tip in SVC is unchanged in position.  No acute infiltrate or pulmonary edema.  Again noted small pulmonary nodules left lung base the largest measures 7 mm.   Original Report Authenticated By: Natasha Mead, M.D.   US Scrotum  10/29/2012   *RADIOLOGY REPORT*  Clinical Data:  Swelling. History of penile cancer.  SCROTAL ULTRASOUND DOPPLER ULTRASOUND OF THE TESTICLES  Technique: Complete ultrasound examination of the testicles, epididymis, and other scrotal structures was performed.  Color and spectral Doppler ultrasound were also utilized to evaluate blood flow to the testicles.  Comparison:  None  Findings:  Right testis:  Normal appearance of the right testicle.  Testicle measures 3.6 x 2.7 x 2.5 cm. There is extensive soft tissue swelling in the scrotum.  There is some heterogeneous material surrounding the testicle.  Left testis:  Normal appearance of the left testicle.  Left testicle measures 3.2 x 2.5 x 2.2 cm.  Similar to the right side, there is heterogeneous material surrounding the left testicle.  Right epididymis:  Not  well visualized.  Left epididymis:  Not well visualized.  Hydrocele:  Absent  Varicocele:  Evidence for a left sided varicocele.  Vessel measure up to 3 mm.  Pulsed Doppler interrogation of both testes demonstrates low resistance flow bilaterally.  IMPRESSION: There appears to be diffuse scrotal swelling.  In addition, there is heterogeneous material surrounding the testicles bilaterally, left side greater than right.  These findings are nonspecific but could be related to an inflammatory or infectious  process.  Normal appearance of both testicles.  No evidence for testicular torsion.  Left varicocele.   Original Report Authenticated By: Richarda Overlie, M.D.   [place] Art/ven Flow Abd Pelv Doppler  10/29/2012   *RADIOLOGY REPORT*  Clinical Data:  Swelling. History of penile cancer.  SCROTAL ULTRASOUND DOPPLER ULTRASOUND OF THE TESTICLES  Technique: Complete ultrasound examination of the testicles, epididymis, and other scrotal structures was performed.  Color and spectral Doppler ultrasound were also utilized to evaluate blood flow to the testicles.  Comparison:  None  Findings:  Right testis:  Normal appearance of the right testicle.  Testicle measures 3.6 x 2.7 x 2.5 cm. There is extensive soft tissue swelling in the scrotum.  There is some heterogeneous material surrounding the testicle.  Left testis:  Normal appearance of the left testicle.  Left testicle measures 3.2 x 2.5 x 2.2 cm.  Similar to the right side, there is heterogeneous material surrounding the left testicle.  Right epididymis:  Not well visualized.  Left epididymis:  Not well visualized.  Hydrocele:  Absent  Varicocele:  Evidence for a left sided varicocele.  Vessel measure up to 3 mm.  Pulsed Doppler interrogation of both testes demonstrates low resistance flow bilaterally.  IMPRESSION: There appears to be diffuse scrotal swelling.  In addition, there is heterogeneous material surrounding the testicles bilaterally, left side greater than right.  These  findings are nonspecific but could be related to an inflammatory or infectious process.  Normal appearance of both testicles.  No evidence for testicular torsion.  Left varicocele.   Original Report Authenticated By: Richarda Overlie, M.D.    Scheduled Meds: . ALPRAZolam  0.5 mg Oral TID  . antiseptic oral rinse  15 mL Mouth Rinse BID  . cefTRIAXone (ROCEPHIN)  IV  1 g Intravenous Q24H  . fentaNYL  50 mcg Transdermal Q72H  . fluconazole (DIFLUCAN) IV  100 mg Intravenous Q24H  . furosemide  20 mg Intravenous BID  . magic mouthwash  5 mL Oral QID  . megestrol  400 mg Oral Daily  . mirtazapine  15 mg Oral QHS  . pantoprazole (PROTONIX) IV  40 mg Intravenous Q24H  . sodium chloride  3 mL Intravenous Q12H  . sodium chloride  3 mL Intravenous Q12H  . vancomycin  1,000 mg Intravenous Q12H   Continuous Infusions:   Active Problems:   Bladder cancer   Protein-calorie malnutrition, severe   Oral candida   Anemia   Cellulitis   Hypokalemia   Positive fecal occult blood test   UTI (lower urinary tract infection)    Time spent:    [name]  [facility] Pager [phone]. If 7PM-7AM, please contact night-coverage at [email or web address], password [facility] 10/30/2012, 8:09 PM  LOS: 1 day

## 2012-10-30 NOTE — Consult Note (Addendum)
REVIEWED. CONSIDER FLEX SIG. PT HAS NO S/ SX OF ACTIVE GIB. PT DECLINES EGD/TCS/FSIG AT THIS TIME.

## 2012-10-30 NOTE — Progress Notes (Signed)
*  PRELIMINARY RESULTS* Echocardiogram 2D Echocardiogram has been performed.  Woods Gangemi [name] 10/30/2012, 4:08 PM 

## 2012-10-30 NOTE — Care Management Note (Signed)
    Page 1 of 2   11/01/2012     3:41:49 PM   CARE MANAGEMENT NOTE 11/01/2012  Patient:  [name]   Account Number:  [id number]  Date Initiated:  10/30/2012  Documentation initiated by:  Rosemary Holms  Subjective/Objective Assessment:   Pt admitted from home where he lives with his wife. Pt is contemplating stopping his Chemo/Radiation. Questions asked regarding hospice services. Advised by CM that he will need to discuss this with his oncologist.     Action/Plan:   Plans to DC back home with [facility] RN. BSC also identified as a need. This will be delivered to his home after DC.   Anticipated DC Date:  10/31/2012   Anticipated DC Plan:  HOME W HOME HEALTH SERVICES         Choice offered to / List presented to:     DME arranged  BEDSIDE COMMODE      DME agency  [facility]     HH arranged  HH-1 RN  HH-10 DISEASE MANAGEMENT      HH agency  [facility]   Status of service:  Completed, signed off Medicare Important Message given?   (If response is "NO", the following Medicare IM given date fields will be blank) Date Medicare IM given:   Date Additional Medicare IM given:    Discharge Disposition:  HOME W HOME HEALTH SERVICES  Per UR Regulation:    If discussed at Long Length of Stay Meetings, dates discussed:    Comments:  11/01/12 Rosemary Holms RN BSN CM [place] [facility] RN to provide [facility] within 24-48 hours. [facility] to deliver [facility] today.  10/30/12 Rosemary Holms RN BSN CM

## 2012-10-30 NOTE — Progress Notes (Signed)
PT Cancellation Note  Patient Details Name: [name] MRN: [id number] DOB: [birth year]   Cancelled Treatment:    Reason Eval/Treat Not Completed: PT screened, no needs identified, will sign off Pt would benefit from having a BSC at d/c, so will alert Nurse Case Manager.  Otherwise screening reveals no PT needs.  Myrlene Broker L 10/30/2012, 2:56 PM

## 2012-10-30 NOTE — Progress Notes (Signed)
INITIAL NUTRITION ASSESSMENT  DOCUMENTATION CODES Per approved criteria  -Severe malnutrition in the context of acute illness; ongoing   INTERVENTION: High Protein snack between meals TID Nutrition services will obtain pt meal preferences daily to maximize meal intake  NUTRITION DIAGNOSIS: Malnutrition related to inadequate oral intake as evidenced by PMH; ongoing  Goal: Pt to meet >/= 90% of their estimated nutrition needs   Monitor:  Po intake tolerance, labs and wt trends  Reason for Assessment: Malnutrition Screen Score = 3  57 y.o. male   ASSESSMENT: Patient Active Problem List   Diagnosis Date Noted  . Oral candida 10/29/2012  . Anemia 10/29/2012  . Cellulitis 10/29/2012  . Hypokalemia 10/29/2012  . Positive fecal occult blood test 10/29/2012  . UTI (lower urinary tract infection) 10/29/2012  . Protein-calorie malnutrition, severe 08/17/2012  . Prostate cancer 08/11/2012  . Chest pain 01/12/2012  . Renal insufficiency 01/12/2012  . Fever 01/07/2012  . Bladder cancer 01/07/2012  . Dyspnea 01/07/2012  . Epigastric pain 01/07/2012  . UTI (urinary tract infection) 01/07/2012  . Acute renal insufficiency 01/07/2012  . Anemia due to blood loss 01/07/2012  . Acute pyelonephritis 01/07/2012  . Bilateral hydronephrosis 01/07/2012  . Subcutaneous emphysema, postoperative 01/07/2012  . Microhematuria 01/07/2012   Pt with bladder cancer. Followed by clinical nutrition at [facility] during hospitalization. Wt on 6/5 was 197#. Diagnosed with Severe malnutrition due to poor oral intake and unplanned wt loss. His current wt reflects additional 5#, 3% wt loss. He is taking Megace for appetite. High calorie/high protein foods discussed with pt in June. Will add high protein snacks to his daily nutrition provision. Hx of refusing oral nutrition supplements.  Height: Ht Readings from Last 1 Encounters:  10/29/12 5\' 7"  (1.702 m)    Weight: Wt Readings from Last 1 Encounters:   10/30/12 192 lb (87.091 kg)    Ideal Body Weight: 148#  % Ideal Body Weight: 130%  Wt Readings from Last 10 Encounters:  10/30/12 192 lb (87.091 kg)  10/23/12 179 lb (81.194 kg)  10/09/12 179 lb (81.194 kg)  08/17/12 197 lb 1.5 oz (89.4 kg)  08/17/12 197 lb 1.5 oz (89.4 kg)  08/17/12 197 lb 1.5 oz (89.4 kg)  08/11/12 207 lb (93.895 kg)  07/09/12 252 lb (114.306 kg)  06/21/12 231 lb (104.781 kg)  05/28/12 230 lb (104.327 kg)    Usual Body Weight: 207# (May 2014)  % Usual Body Weight: 93%  BMI:  Body mass index is 30.06 kg/(m^2).Obesity Class I  Estimated Nutritional Needs: Kcal: 1900-2200 Protein: 95-113 gr Fluid: >2057ml/day  Skin: No issues noted  Diet Order: Dysphagia 3/thin liquids  EDUCATION NEEDS: -Education needs addressed   Intake/Output Summary (Last 24 hours) at 10/30/12 1054 Last data filed at 10/30/12 0600  Gross per 24 hour  Intake    240 ml  Output   1500 ml  Net  -1260 ml    Last BM: 10/28/12  Labs:   Recent Labs Lab 10/25/12 1333 10/29/12 0810 10/30/12 0541  NA 134 131* 136  K 3.8 3.0* 4.1  CL 95* 97 101  CO2 30 28 30   BUN 19 18 15   CREATININE 0.9 0.76 0.79  CALCIUM 9.1 8.4 8.3*  MG  --   --  1.9  GLUCOSE 94 82 82    CBG (last 3)  No results found for this basename: GLUCAP,  in the last 72 hours  Scheduled Meds: . ALPRAZolam  0.5 mg Oral TID  . antiseptic oral rinse  15 mL Mouth Rinse BID  . cefTRIAXone (ROCEPHIN)  IV  1 g Intravenous Q24H  . fentaNYL  50 mcg Transdermal Q72H  . fluconazole (DIFLUCAN) IV  100 mg Intravenous Q24H  . furosemide  20 mg Intravenous BID  . magic mouthwash  5 mL Oral QID  . megestrol  400 mg Oral Daily  . mirtazapine  15 mg Oral QHS  . pantoprazole (PROTONIX) IV  40 mg Intravenous Q24H  . sodium chloride  3 mL Intravenous Q12H  . sodium chloride  3 mL Intravenous Q12H  . vancomycin  1,000 mg Intravenous Q12H    Continuous Infusions:   Past Medical History  Diagnosis Date  . Gross  hematuria   . C. difficile enteritis 07/04/2011  . Diverticulitis   . Subcutaneous emphysema, postoperative 01/07/2012  . Cirrhosis 01/07/2012    Radiographically  . Splenomegaly   . Bladder cancer 01/07/2012    Transitional Cell Carcinoma, s/p bladder, prostate and lymph node resection with ileal conduit at [facility] on 10/9, discharged on 10/14   . Acute pyelonephritis 01/07/2012  . Prostate cancer 08/11/2012  . Bone cancer     Past Surgical History  Procedure Laterality Date  . Elbow surgery    . Transurethral resection of bladder tumor  10/26/2011    Procedure: TRANSURETHRAL RESECTION OF BLADDER TUMOR (TURBT);  Surgeon: Ky Barban, MD;  Location: [facility];  Service: Urology;  Laterality: N/A;  . Transurethral resection of prostate  10/26/2011    Procedure: TRANSURETHRAL RESECTION OF THE PROSTATE (TURP);  Surgeon: Ky Barban, MD;  Location: [facility];  Service: Urology;  Laterality: N/A;  . Inguinal hernia repair Left 08/17/2012    Procedure: Left inguinal lymph node excision;  Surgeon: Lodema Pilot, DO;  Location: [facility];  Service: General;  Laterality: Left;  . Portacath placement N/A 08/23/2012    Procedure: INSERTION PORT-A-CATH;  Surgeon: Kandis Cocking, MD;  Location: [facility];  Service: General;  Laterality: N/A;  . Back surgery      Royann Shivers MS,RD,LDN,CSG Office: [phone] Pager: [phone]

## 2012-10-31 DIAGNOSIS — B370 Candidal stomatitis: Secondary | ICD-10-CM

## 2012-10-31 LAB — BASIC METABOLIC PANEL
BUN: 19 mg/dL (ref 6–23)
CO2: 32 mEq/L (ref 19–32)
Calcium: 7.9 mg/dL — ABNORMAL LOW (ref 8.4–10.5)
Chloride: 97 mEq/L (ref 96–112)
Creatinine, Ser: 0.88 mg/dL (ref 0.50–1.35)
GFR calc Af Amer: >90 mL/min (ref >90)
GFR calc non Af Amer: >90 mL/min (ref >90)
Glucose, Bld: 88 mg/dL (ref 70–99)
Potassium: 3.3 mEq/L — ABNORMAL LOW (ref 3.5–5.1)
Sodium: 134 mEq/L — ABNORMAL LOW (ref 135–145)

## 2012-10-31 LAB — CBC
HCT: 27.6 % — ABNORMAL LOW (ref 39.0–52.0)
Hemoglobin: 8.8 g/dL — ABNORMAL LOW (ref 13.0–17.0)
MCH: 24.8 pg — ABNORMAL LOW (ref 26.0–34.0)
MCHC: 31.9 g/dL (ref 30.0–36.0)
MCV: 77.7 fL — ABNORMAL LOW (ref 78.0–100.0)
Platelets: 127 10*3/uL — ABNORMAL LOW (ref 150–400)
RBC: 3.55 MIL/uL — ABNORMAL LOW (ref 4.22–5.81)
RDW: 16.2 % — ABNORMAL HIGH (ref 11.5–15.5)
WBC: 5.7 10*3/uL (ref 4.0–10.5)

## 2012-10-31 LAB — PROTIME-INR
INR: 1.52 — ABNORMAL HIGH (ref 0.00–1.49)
Prothrombin Time: 17.9 seconds — ABNORMAL HIGH (ref 11.6–15.2)

## 2012-10-31 MED ORDER — FLUCONAZOLE 100 MG PO TABS
100.0000 mg | ORAL_TABLET | Freq: Every day | ORAL | Status: DC
  Administered 2012-10-31 – 2012-11-01 (×2): 100 mg via ORAL
  Filled 2012-10-31 (×2): qty 1

## 2012-10-31 MED ORDER — POTASSIUM CHLORIDE CRYS ER 20 MEQ PO TBCR
40.0000 meq | EXTENDED_RELEASE_TABLET | Freq: Once | ORAL | Status: AC
  Administered 2012-10-31: 40 meq via ORAL
  Filled 2012-10-31: qty 2

## 2012-10-31 MED ORDER — PANTOPRAZOLE SODIUM 40 MG PO TBEC
40.0000 mg | DELAYED_RELEASE_TABLET | Freq: Every day | ORAL | Status: DC
  Administered 2012-10-31 – 2012-11-01 (×2): 40 mg via ORAL
  Filled 2012-10-31 (×2): qty 1

## 2012-10-31 NOTE — Progress Notes (Signed)
[facility] PROGRESS NOTE  [name] [facility] JXB:[id number] DOB: 1955-07-23 DOA: 10/29/2012 PCP: Pcp Not In System Oncologist: Dr. Drue Dun  Assessment/Plan: 1. UTI: Continue empiric Rocephin. Followup culture. 2. Scrotal edema: Continues to improve, doubt cellulitis. No sign of peritoneal involvement. Neuro antibiotics. Likely secondary to hypoalbuminemia, responding to Lasix. 3. Oral candidiasis: Continue Diflucan, magic mouthwash. Improving. 4. Dyspnea: Likely secondary to anxiety. Resolved. 5. Anemia, multifactorial: Possibly related to chemotherapy. Possible chronic blood loss with microcytosis. Gastroenterology consulted. Question rectal mass. Evaluation deferred given her overall health and comorbidities. 6. Firm rectal mass: Evaluation deferred for now. Gastroenterology. 7. Metastatic bladder cancer: Intrathoracic disease, liver metastasis, abdominal retroperitoneal lymph node involvement. Started chemotherapy 8/11 8. History of diarrhea secondary to radiation treatment: No diarrhea currently. 9. Anxiety: Continue Xanax and Remeron. 10. Severe malnutrition in the context of acute illness: Appears stable. 11. Thrombocytopenia: Likely secondary to chemotherapy. Followup as an outpatient.   Replace potassium  Narrow antibiotics, discontinue vancomycin  Likely on 8/20  Code Status: Full code DVT prophylaxis: SCDs Family Communication: Discussed with wife at bedside Disposition Plan: Home health RN, disease management   Edwin Sacks, MD  [facility]  Pager [phone] If 7PM-7AM, please contact night-coverage at [email or web address], password [facility] 10/31/2012, 1:50 PM  LOS: 2 days   Clinical Summary: 57 year old man with history of stage IV bladder cancer with metastasis to bone, liver, lungs recently started chemotherapy approximately one week prior to admission presented with worsening edema, tenderness to scrotum, shortness of breath, cough, odynophagia. Frequent  diarrhea on chemotherapy. Admitted for scrotal swelling, positive cellulitis and started on empiric antibiotics.   Seen in consultation with gastroenterology, conservative management was recommended with Dr. ferment of endoscopy given overall health. [name] was treated with Diflucan.  Consultants:  Gastroenterology   Procedures:  2-D echocardiogram:   Antibiotics:  Vancomycin 8/17  >>    Ceftriaxone 8/17 >>   HPI/Subjective: Feels better. Eating. No diarrhea. Normal bowel movement today. Scrotum improving daily with decreased edema. No pain in scrotum.  Objective: Filed Vitals:   10/30/12 0500 10/30/12 0647 10/31/12 0330 10/31/12 0512  BP:  115/73  105/67  Pulse:  109  82  Temp:  98 F (36.7 C)  98.3 F (36.8 C)  TempSrc:    Oral  Resp:  18  18  Height:      Weight: 87.091 kg (192 lb)  85.7 kg (188 lb 15 oz)   SpO2:  96%  97%    Intake/Output Summary (Last 24 hours) at 10/31/12 1350 Last data filed at 10/31/12 1041  Gross per 24 hour  Intake    940 ml  Output   2350 ml  Net  -1410 ml     Filed Weights   10/29/12 1433 10/30/12 0500 10/31/12 0330  Weight: 87.1 kg (192 lb 0.3 oz) 87.091 kg (192 lb) 85.7 kg (188 lb 15 oz)    Exam:   Afebrile, vital signs stable  General: Appears calm and comfortable. Speech fluent and clear.  Psychiatric: Grossly normal mood and affect. Speech fluent and appropriate.  Cardiovascular: Regular rate and rhythm. No murmur, rub, gallop. No lower extremity edema.  Respiratory: Clear to auscultation bilaterally. No wheezes, rales, rhonchi. Normal respiratory effort.  GU: Penis with some edema. No significant erythema of the scrotum or penis. Perineum appears normal. No tenderness with palpation of the scrotum or testicles.  Data Reviewed:  Potassium 3.3.  Hemoglobin 8.8, platelets 127  Pending studies:   Blood cultures no growth to date  Urine  culture gram-negative rods  Scheduled Meds: . ALPRAZolam  0.5 mg Oral TID   . antiseptic oral rinse  15 mL Mouth Rinse BID  . cefTRIAXone (ROCEPHIN)  IV  1 g Intravenous Q24H  . fentaNYL  50 mcg Transdermal Q72H  . fluconazole  100 mg Oral Daily  . furosemide  20 mg Intravenous BID  . magic mouthwash  5 mL Oral QID  . megestrol  400 mg Oral Daily  . mirtazapine  15 mg Oral QHS  . pantoprazole  40 mg Oral QAC breakfast  . sodium chloride  3 mL Intravenous Q12H  . sodium chloride  3 mL Intravenous Q12H  . vancomycin  1,000 mg Intravenous Q12H   Continuous Infusions:   Active Problems:   Bladder cancer   Protein-calorie malnutrition, severe   Oral candida   Anemia   Cellulitis   Hypokalemia   Positive fecal occult blood test   UTI (lower urinary tract infection)   Time spent 20 minutes

## 2012-10-31 NOTE — Progress Notes (Signed)
Attending note:   Reviewed GI issues with this nice gentleman today. He is not interested in any GI evaluation at this time given his other comorbidities. He seems to understand the risks of putting off such an evaluation.    If anything changes, please call us back.

## 2012-10-31 NOTE — Progress Notes (Signed)
Subjective: Lower abdominal discomfort intermittently but no distress.  no further diarrhea. No N/V. Odynophagia resolved. Tolerating diet. Desires to abstain from any invasive GI procedures.   Objective: Vital signs in last 24 hours: Temp:  [98.3 F (36.8 C)] 98.3 F (36.8 C) (08/19 0512) Pulse Rate:  [82] 82 (08/19 0512) Resp:  [18] 18 (08/19 0512) BP: (105)/(67) 105/67 mmHg (08/19 0512) SpO2:  [97 %] 97 % (08/19 0512) Weight:  [188 lb 15 oz (85.7 kg)] 188 lb 15 oz (85.7 kg) (08/19 0330) Last BM Date: 10/28/12 General:   Alert and oriented, drowsy  Mouth:  Without lesions, mucosa pink and moist.  Heart:  S1, S2 present, no murmurs noted.  Abdomen:  Bowel sounds present, soft, non-tender, non-distended. Urostomy bag noted with pink stoma, clear yellow urine.  Extremities:  Pedal edema bilaterally    Intake/Output from previous day: 08/18 0701 - 08/19 0700 In: 940 [P.O.:240; IV Piggyback:700] Out: 1950 [Urine:1950] Intake/Output this shift:    Lab Results:  Recent Labs  10/29/12 0810 10/30/12 0541 10/31/12 0526  WBC 8.4 5.6 5.7  HGB 9.5* 9.2* 8.8*  HCT 29.8* 28.6* 27.6*  PLT 184 173 127*   BMET  Recent Labs  10/29/12 0810 10/30/12 0541 10/31/12 0526  NA 131* 136 134*  K 3.0* 4.1 3.3*  CL 97 101 97  CO2 28 30 32  GLUCOSE 82 82 88  BUN 18 15 19   CREATININE 0.76 0.79 0.88  CALCIUM 8.4 8.3* 7.9*   LFT  Recent Labs  10/29/12 0810 10/30/12 0541  PROT 5.6* 5.4*  ALBUMIN 2.0* 1.7*  AST 116* 83*  ALT 64* 57*  ALKPHOS 316* 312*  BILITOT 1.2 0.6     Studies/Results: Dg Chest 1 View  10/29/2012   *RADIOLOGY REPORT*  Clinical Data: Chest pain, shortness of breath  CHEST - 1 VIEW  Comparison: 10/23/2012  Findings: Cardiomediastinal silhouette is stable.  Left subclavian Port-A-Cath with tip in SVC is unchanged in position.  No acute infiltrate or pulmonary edema.  Again noted  small pulmonary nodules left lung base the largest measures 7 mm.  IMPRESSION:    Left subclavian Port-A-Cath with tip in SVC is unchanged in position.  No acute infiltrate or pulmonary edema.  Again noted small pulmonary nodules left lung base the largest measures 7 mm.   Original Report Authenticated By: Natasha Mead, M.D.   US Scrotum  10/29/2012   *RADIOLOGY REPORT*  Clinical Data:  Swelling. History of penile cancer.  SCROTAL ULTRASOUND DOPPLER ULTRASOUND OF THE TESTICLES  Technique: Complete ultrasound examination of the testicles, epididymis, and other scrotal structures was performed.  Color and spectral Doppler ultrasound were also utilized to evaluate blood flow to the testicles.  Comparison:  None  Findings:  Right testis:  Normal appearance of the right testicle.  Testicle measures 3.6 x 2.7 x 2.5 cm. There is extensive soft tissue swelling in the scrotum.  There is some heterogeneous material surrounding the testicle.  Left testis:  Normal appearance of the left testicle.  Left testicle measures 3.2 x 2.5 x 2.2 cm.  Similar to the right side, there is heterogeneous material surrounding the left testicle.  Right epididymis:  Not well visualized.  Left epididymis:  Not well visualized.  Hydrocele:  Absent  Varicocele:  Evidence for a left sided varicocele.  Vessel measure up to 3 mm.  Pulsed Doppler interrogation of both testes demonstrates low resistance flow bilaterally.  IMPRESSION: There appears to be diffuse scrotal swelling.  In addition, there  is heterogeneous material surrounding the testicles bilaterally, left side greater than right.  These findings are nonspecific but could be related to an inflammatory or infectious process.  Normal appearance of both testicles.  No evidence for testicular torsion.  Left varicocele.   Original Report Authenticated By: Richarda Overlie, M.D.   [place] Art/ven Flow Abd Pelv Doppler  10/29/2012   *RADIOLOGY REPORT*  Clinical Data:  Swelling. History of penile cancer.  SCROTAL ULTRASOUND DOPPLER ULTRASOUND OF THE TESTICLES  Technique: Complete  ultrasound examination of the testicles, epididymis, and other scrotal structures was performed.  Color and spectral Doppler ultrasound were also utilized to evaluate blood flow to the testicles.  Comparison:  None  Findings:  Right testis:  Normal appearance of the right testicle.  Testicle measures 3.6 x 2.7 x 2.5 cm. There is extensive soft tissue swelling in the scrotum.  There is some heterogeneous material surrounding the testicle.  Left testis:  Normal appearance of the left testicle.  Left testicle measures 3.2 x 2.5 x 2.2 cm.  Similar to the right side, there is heterogeneous material surrounding the left testicle.  Right epididymis:  Not well visualized.  Left epididymis:  Not well visualized.  Hydrocele:  Absent  Varicocele:  Evidence for a left sided varicocele.  Vessel measure up to 3 mm.  Pulsed Doppler interrogation of both testes demonstrates low resistance flow bilaterally.  IMPRESSION: There appears to be diffuse scrotal swelling.  In addition, there is heterogeneous material surrounding the testicles bilaterally, left side greater than right.  These findings are nonspecific but could be related to an inflammatory or infectious process.  Normal appearance of both testicles.  No evidence for testicular torsion.  Left varicocele.   Original Report Authenticated By: Richarda Overlie, M.D.    Assessment: 57 year old male with stage IV bladder cancer admitted with shortness of breath, scrotal edema, odynophagia, frequent diarrhea, overall clinically improving since admission.   Anemia: likely multifactorial without any signs of overt GI bleeding. Heme positive. Slight drift noted, likely dilutional. Patient declines TCS/EGD/flex sig.   Possible very small rectal mass/polyp: non-obstructive, appreciated on digital rectal exam. Patient declines flex sig/TCS.   Odynophagia: resolved since starting mouthwash and Diflucan. Needs EGD if persistent issues despite therapy.   Cirrhosis: compensated. Unclear  etiology with elevated LFTs in the setting of liver mets. Check INR but hold off on further work-up for cirrhosis due to patient's current status of metastatic disease.   Loose stools: resolved. Obtain stool studies if recurs. Likely multifactorial.   Plan: INR Switch Diflucan to oral Monitor for overt signs of GI bleeding Consider EGD/TCS vs flex sig if patient desires in the future: for now, he has deferred further invasive work-up. Will follow peripherally for now.   Nira Retort, ANP-BC [facility]    LOS: 2 days    10/31/2012, 8:17 AM

## 2012-11-01 DIAGNOSIS — L0291 Cutaneous abscess, unspecified: Secondary | ICD-10-CM

## 2012-11-01 LAB — URINE CULTURE

## 2012-11-01 LAB — CLOSTRIDIUM DIFFICILE BY PCR: Toxigenic C. Difficile by PCR: NEGATIVE

## 2012-11-01 MED ORDER — HEPARIN SOD (PORK) LOCK FLUSH 100 UNIT/ML IV SOLN
500.0000 [IU] | INTRAVENOUS | Status: AC | PRN
  Administered 2012-11-01: 500 [IU]
  Filled 2012-11-01: qty 5

## 2012-11-01 MED ORDER — CEFUROXIME AXETIL 250 MG PO TABS
500.0000 mg | ORAL_TABLET | Freq: Two times a day (BID) | ORAL | Status: DC
  Administered 2012-11-01: 500 mg via ORAL
  Filled 2012-11-01: qty 2

## 2012-11-01 MED ORDER — MIRTAZAPINE 15 MG PO TABS: 15.0000 mg | ORAL_TABLET | Freq: Every day | ORAL | Status: AC

## 2012-11-01 MED ORDER — PANTOPRAZOLE SODIUM 40 MG PO TBEC: 40.0000 mg | DELAYED_RELEASE_TABLET | Freq: Every day | ORAL | Status: AC

## 2012-11-01 MED ORDER — CEFUROXIME AXETIL 500 MG PO TABS: 500.0000 mg | ORAL_TABLET | Freq: Two times a day (BID) | ORAL | Status: DC

## 2012-11-01 MED ORDER — CEFUROXIME AXETIL 500 MG PO TABS: 500.0000 mg | ORAL_TABLET | Freq: Two times a day (BID) | ORAL | Status: AC

## 2012-11-01 MED ORDER — FLUCONAZOLE 100 MG PO TABS: 100.0000 mg | ORAL_TABLET | Freq: Every day | ORAL | Status: AC

## 2012-11-01 NOTE — Progress Notes (Signed)
[facility] PROGRESS NOTE  [name] [facility] ZOX:[id number] DOB: 1955-07-30 DOA: 10/29/2012 PCP: Pcp Not In System Oncologist: Dr. Drue Dun  Assessment/Plan: 1. UTI: Appears clinically resolved. Change to oral antibiotics. 2. Scrotal edema: Appears resolved. Erythema nearly resolved. Suspect secondary to edema rather than cellulitis, nevertheless complete antibiotics as an outpatient. Suspect edema was related to chemotherapy. 3. Oral candidiasis: Appears resolved. Continue Diflucan while on antibiotics. 4. Dyspnea: Likely secondary to anxiety. Resolved. 5. Anemia, multifactorial: Possibly related to chemotherapy. Possible chronic blood loss with microcytosis. Gastroenterology consulted. Question rectal mass. Evaluation deferred given her overall health and comorbidities. 6. Firm rectal mass: Evaluation deferred for now per gastroenterology. 7. Metastatic bladder cancer: Intrathoracic disease, liver metastasis, abdominal retroperitoneal lymph node involvement. Started chemotherapy 8/11 8. History of diarrhea secondary to radiation treatment: No diarrhea currently. 9. Anxiety: Continue Xanax and Remeron. 10. Severe malnutrition in the context of acute illness: Appears stable. 11. Thrombocytopenia: Likely secondary to chemotherapy. Followup as an outpatient.   Followup resolution scrotal edema, possible cellulitis; possible scrotal plaque  Discharge home today  Code Status: Full code DVT prophylaxis: SCDs Family Communication: Discussed with wife at bedside 8/20 Disposition Plan: Home health RN, disease management, BSC    Edwin Sacks, MD  [facility]  Pager [phone] If 7PM-7AM, please contact night-coverage at [email or web address], password [facility] 11/01/2012, 2:32 PM  LOS: 3 days   Clinical Summary: 57 year old man with history of stage IV bladder cancer with metastasis to bone, liver, lungs recently started chemotherapy approximately one week prior to admission presented with  worsening edema, tenderness to scrotum, shortness of breath, cough, odynophagia. Frequent diarrhea on chemotherapy. Admitted for scrotal swelling, positive cellulitis and started on empiric antibiotics.   Seen in consultation with gastroenterology, conservative management was recommended with Dr. ferment of endoscopy given overall health. [name] was treated with Diflucan.  Consultants:  Gastroenterology   Procedures:  2-D echocardiogram: Left ventricular ejection fraction 55-60%. Grade 1 diastolic dysfunction.  Antibiotics:  Vancomycin 8/17  >>  8/19  Ceftriaxone 8/17 >> 8/19  Ceftin 8/20 >> 8/23  HPI/Subjective: Continues to feel better. Scrotal swelling is nearly resolved. No pain. Eating well. No difficulty swallowing or pain.  Objective: Filed Vitals:   10/31/12 0512 10/31/12 1415 10/31/12 2057 11/01/12 0439  BP: 105/67 110/64 110/66 127/70  Pulse: 82 88 103 105  Temp: 98.3 F (36.8 C) 98.7 F (37.1 C) 98 F (36.7 C) 97.9 F (36.6 C)  TempSrc: Oral Oral Oral Oral  Resp: 18 18 18 18   Height:      Weight:    81 kg (178 lb 9.2 oz)  SpO2: 97% 98% 99% 100%    Intake/Output Summary (Last 24 hours) at 11/01/12 1432 Last data filed at 11/01/12 1248  Gross per 24 hour  Intake   1380 ml  Output   1250 ml  Net    130 ml     Filed Weights   10/30/12 0500 10/31/12 0330 11/01/12 0439  Weight: 87.091 kg (192 lb) 85.7 kg (188 lb 15 oz) 81 kg (178 lb 9.2 oz)    Exam:   Afebrile, vital signs stable  General: Appears calm and comfortable.  Cardiovascular: Regular rate and rhythm. No murmur, rub, gallop.   Respiratory: Clear to auscultation bilaterally. No wheezes, rales, rhonchi. Normal respiratory effort.  Abdomen: Appears unremarkable. Urostomy appears unremarkable.  GU: Circumcised penis, appears unremarkable. Scrotum appears to be of normal size, edema appears resolved. Minimal erythema. Testicles nontender. Perineum appears normal. A small plaque is noted on  the scrotum.  Data Reviewed:  No new data  Pending studies:   Blood cultures no growth to date  Scheduled Meds: . ALPRAZolam  0.5 mg Oral TID  . antiseptic oral rinse  15 mL Mouth Rinse BID  . cefTRIAXone (ROCEPHIN)  IV  1 g Intravenous Q24H  . fentaNYL  50 mcg Transdermal Q72H  . fluconazole  100 mg Oral Daily  . furosemide  20 mg Intravenous BID  . magic mouthwash  5 mL Oral QID  . megestrol  400 mg Oral Daily  . mirtazapine  15 mg Oral QHS  . pantoprazole  40 mg Oral QAC breakfast  . sodium chloride  3 mL Intravenous Q12H  . sodium chloride  3 mL Intravenous Q12H   Continuous Infusions:   Active Problems:   Bladder cancer   Protein-calorie malnutrition, severe   Oral candida   Anemia   Cellulitis   Hypokalemia   Positive fecal occult blood test   UTI (lower urinary tract infection)

## 2012-11-01 NOTE — Progress Notes (Signed)
Patient discharged home with [facility]. Port flushed with heparin and deaccessed.Dose of ceftin given along with other due medications.  Instructed on new prescriptions and how to take them.  Pt and wife verbalize understanding.  Instructed to make follow up appt with Oncologist in 2 weeks.  BSC to be delivered to home.  Pt or wife have no questions at this time.  Stable to DC. Left floor via WC with NT.

## 2012-11-01 NOTE — Progress Notes (Signed)
Pt very restless and anxious.  Pt will not sit still and has been taking the sheets off his bed and sleeping only on the mattress.  Pt has not slept at all tonight.  Gave prn ativan to help pt to relax.

## 2012-11-01 NOTE — Discharge Summary (Addendum)
Physician Discharge Summary  [name] [facility] ZOX:[id number] DOB: Aug 13, 1955 DOA: 10/29/2012  PCP: Pcp Not In System Oncologist: Dr. Myna King  Admit date: 10/29/2012 Discharge date: 11/01/2012  Recommendations for Outpatient Follow-up:  1. Followup resolution of scrotal edema, possible plaque on scrotum 2. Followup multifactorial anemia and thrombocytopenia, see discussion below 3. Consider outpatient GI evaluation if patient desires, see below 4. Followup severe malnutrition  5. Home health RN, disease management, bedside commode  Follow-up Information   Follow up with Edwin Macho, MD In 2 weeks.   Specialty:  Oncology   Contact information:   [address] [place] [address] [phone]      Discharge Diagnoses:  1. UTI 2. Scrotal edema, possible cellulitis 3. Oral candidiasis 4. Multifactorial anemia 5. Thrombocytopenia 6. Firm rectal mass 7. Anxiety 8. Severe malnutrition in context of acute illness 9. 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 Frequent diarrhea on chemotherapy. Admitted for scrotal swelling, positive cellulitis and started on empiric antibiotics.   Hospital Course:  [name] was admitted for further evaluation and treatment of scrotal edema. This rapidly improved with Lasix. Patient was also treated with empiric antibiotics but infectious component doubted. Possibly related to chemotherapy and very low albumin. There is no evidence to suggest  testicular abnormality. Anemia and thrombocytopenia likely related to chemotherapy and chronic disease. Firm rectal mass was suspected. Seen in consultation with gastroenterology, conservative management was recommended with deferment of endoscopy given overall health--this was the patient's decision as he did not want to pursue further evaluation. [name] was treated with Diflucan.  1. UTI: Appears clinically resolved. Finish oral antibiotics. 2. Scrotal edema: Appears resolved. Erythema nearly resolved. Suspect secondary to edema rather than cellulitis, nevertheless complete antibiotics as an outpatient. Suspect edema was related to chemotherapy. 3. Oral candidiasis: Appears resolved. Continue Diflucan while on antibiotics. 4. Dyspnea: Likely secondary to anxiety. Resolved. 5. Anemia, multifactorial: Possibly related to chemotherapy. Possible chronic blood loss with microcytosis. Gastroenterology consulted. Question rectal mass. Evaluation deferred given her overall health and comorbidities. 6. Firm rectal mass: Evaluation deferred for now per gastroenterology as patient does not desire further investigation. 7. Metastatic bladder cancer: Intrathoracic disease, liver metastasis, abdominal retroperitoneal lymph node involvement. Started chemotherapy 8/11 8. History of diarrhea secondary to radiation treatment: No diarrhea currently. 9. Anxiety: Continue Xanax and Remeron. 10. Severe malnutrition in the context of acute illness: Appears stable. 11. Thrombocytopenia: Likely secondary to chemotherapy. Followup as an outpatient.  Consultants:  Gastroenterology  Procedures:  2-D echocardiogram: Left ventricular ejection fraction 55-60%. Grade 1 diastolic dysfunction. Antibiotics:  Vancomycin 8/17 >> 8/19  Ceftriaxone 8/17 >> 8/19  Ceftin 8/20 >> 8/23   Discharge Instructions  Discharge Orders   Future Appointments Provider Department Dept Phone   11/09/2012 10:30 AM [name] [facility] [phone]   11/09/2012 11:00 AM Edwin Macho, MD [facility] [phone]   11/09/2012 11:45 AM [facility] 1  [facility] [phone]  12-14-12 10:15 AM [name] [facility] [phone]   Dec 14, 2012 10:30 AM [facility] [address] [place] [facility] [phone]   11/29/2012 10:15 AM [name] [facility] [phone]   11/29/2012 10:30 AM [facility] [address] [place] [facility] [phone]   12/06/2012 11:30 AM [name] [facility] [phone]   12/06/2012 12:00 PM Edwin Macho, MD [place] [facility] [phone]   12/06/2012 1:00 PM [facility] [address] [place] [facility] [phone]   Future Orders Complete By Expires   Activity as tolerated - No restrictions  As directed    Diet general  As directed    Discharge instructions  As directed    Comments:     Be sure to finish antibiotic (Ceftin) for bladder infection. Finish Diflucan for thrush. Call your physician or seek immediate medical attention for recurrent swelling or worsening of condition.       Medication List    STOP taking these medications       LORazepam 0.5 MG tablet  Commonly known as:  ATIVAN      TAKE these medications       ALPRAZolam 0.5 MG tablet  Commonly known as:  XANAX  Take one every 8 hours for nerves.  Take one or 2 at bedtime as needed     cefUROXime 500 MG tablet  Commonly known as:  CEFTIN  Take 1 tablet (500 mg total) by mouth 2 (two) times daily with a meal. Start 8/21 in the morning.     dexamethasone 4 MG tablet  Commonly known as:  DECADRON  Take 2 tablets (8 mg total) by mouth 2 (two) times daily with a meal. Take two times a day starting the day after chemo for 3 days.     fentaNYL 50 MCG/HR  Commonly known as:   DURAGESIC - dosed mcg/hr  Place 1 patch onto the skin every 3 (three) days.     fluconazole 100 MG tablet  Commonly known as:  DIFLUCAN  Take 1 tablet (100 mg total) by mouth daily.     HYDROcodone-acetaminophen 5-325 MG per tablet  Commonly known as:  NORCO/VICODIN  Take 1 tablet by mouth every 6 (six) hours as needed for pain.     lidocaine-prilocaine cream  Commonly known as:  EMLA  Apply topically as needed. Apply to port one hour before chemo treatment is to start.     megestrol 625 MG/5ML suspension  Commonly known as:  MEGACE ES  Take 5 mLs (625 mg total) by mouth daily.     mirtazapine 15 MG tablet  Commonly known as:  REMERON  Take 1 tablet (15 mg total) by mouth at bedtime.     multivitamin with minerals Tabs tablet  Take 1 tablet by mouth daily.     ondansetron 8 MG tablet  Commonly known as:  ZOFRAN  Take 1 tabs twice daily starting the day after chemo for 3 days, then twice daily as needed for nausea and vomiting.     pantoprazole 40 MG tablet  Commonly known as:  PROTONIX  Take 1 tablet (40 mg total) by mouth daily before breakfast.     prochlorperazine 10 MG tablet  Commonly known as:  COMPAZINE  Take 1 tablet (10 mg total) by mouth every 6 (six) hours as needed.  No Known Allergies  The results of significant diagnostics from this hospitalization (including imaging, microbiology, ancillary and laboratory) are listed below for reference.    Significant Diagnostic Studies: Dg Chest 1 View  10/29/2012   *RADIOLOGY REPORT*  Clinical Data: Chest pain, shortness of breath  CHEST - 1 VIEW  Comparison: 10/23/2012  Findings: Cardiomediastinal silhouette is stable.  Left subclavian Port-A-Cath with tip in SVC is unchanged in position.  No acute infiltrate or pulmonary edema.  Again noted  small pulmonary nodules left lung base the largest measures 7 mm.  IMPRESSION:   Left subclavian Port-A-Cath with tip in SVC is unchanged in position.  No acute infiltrate or  pulmonary edema.  Again noted small pulmonary nodules left lung base the largest measures 7 mm.   Original Report Authenticated By: Natasha Mead, M.D.   US Scrotum  10/29/2012   *RADIOLOGY REPORT*  Clinical Data:  Swelling. History of penile cancer.  SCROTAL ULTRASOUND DOPPLER ULTRASOUND OF THE TESTICLES  Technique: Complete ultrasound examination of the testicles, epididymis, and other scrotal structures was performed.  Color and spectral Doppler ultrasound were also utilized to evaluate blood flow to the testicles.  Comparison:  None  Findings:  Right testis:  Normal appearance of the right testicle.  Testicle measures 3.6 x 2.7 x 2.5 cm. There is extensive soft tissue swelling in the scrotum.  There is some heterogeneous material surrounding the testicle.  Left testis:  Normal appearance of the left testicle.  Left testicle measures 3.2 x 2.5 x 2.2 cm.  Similar to the right side, there is heterogeneous material surrounding the left testicle.  Right epididymis:  Not well visualized.  Left epididymis:  Not well visualized.  Hydrocele:  Absent  Varicocele:  Evidence for a left sided varicocele.  Vessel measure up to 3 mm.  Pulsed Doppler interrogation of both testes demonstrates low resistance flow bilaterally.  IMPRESSION: There appears to be diffuse scrotal swelling.  In addition, there is heterogeneous material surrounding the testicles bilaterally, left side greater than right.  These findings are nonspecific but could be related to an inflammatory or infectious process.  Normal appearance of both testicles.  No evidence for testicular torsion.  Left varicocele.   Original Report Authenticated By: Richarda Overlie, M.D.   Microbiology: Recent Results (from the past 240 hour(s))  URINE CULTURE     Status: None   Collection Time    10/29/12  7:58 AM      Result Value Range Status   Specimen Description URINE, SUPRAPUBIC   Final   Special Requests NONE   Final   Culture  Setup Time     Final   Value: 10/29/2012  18:43     Performed at [facility] Count     Final   Value: >=100,000 COLONIES/ML     Performed at [facility]   Culture     Final   Value: ESCHERICHIA COLI     Performed at [facility]   Report Status 11/01/2012 FINAL   Final   Organism ID, Bacteria ESCHERICHIA COLI   Final  CULTURE, BLOOD (ROUTINE X 2)     Status: None   Collection Time    10/29/12  5:00 PM      Result Value Range Status   Specimen Description BLOOD RIGHT ARM   Final   Special Requests BOTTLES DRAWN AEROBIC AND ANAEROBIC 6CC EACH   Final   Culture NO GROWTH 3 DAYS   Final  Report Status PENDING   Incomplete  CULTURE, BLOOD (ROUTINE X 2)     Status: None   Collection Time    10/29/12  5:15 PM      Result Value Range Status   Specimen Description BLOOD LEFT ARM   Final   Special Requests BOTTLES DRAWN AEROBIC AND ANAEROBIC 8CC EACH   Final   Culture NO GROWTH 3 DAYS   Final   Report Status PENDING   Incomplete  CLOSTRIDIUM DIFFICILE BY PCR     Status: None   Collection Time    10/31/12  8:00 AM      Result Value Range Status   C difficile by pcr NEGATIVE  NEGATIVE Final     Labs: Basic Metabolic Panel:  Recent Labs Lab 10/29/12 0810 10/30/12 0541 10/31/12 0526  NA 131* 136 134*  K 3.0* 4.1 3.3*  CL 97 101 97  CO2 28 30 32  GLUCOSE 82 82 88  BUN 18 15 19   CREATININE 0.76 0.79 0.88  CALCIUM 8.4 8.3* 7.9*  MG  --  1.9  --    Liver Function Tests:  Recent Labs Lab 10/29/12 0810 10/30/12 0541  AST 116* 83*  ALT 64* 57*  ALKPHOS 316* 312*  BILITOT 1.2 0.6  PROT 5.6* 5.4*  ALBUMIN 2.0* 1.7*   CBC:  Recent Labs Lab 10/29/12 0810 10/30/12 0541 10/31/12 0526  WBC 8.4 5.6 5.7  NEUTROABS 8.1*  --   --   HGB 9.5* 9.2* 8.8*  HCT 29.8* 28.6* 27.6*  MCV 77.0* 78.1 77.7*  PLT 184 173 127*   Cardiac Enzymes:  Recent Labs Lab 10/29/12 0810  TROPONINI <0.30    Recent Labs  10/29/12 0810  PROBNP 1096.0*    Active Problems:   Bladder cancer    Protein-calorie malnutrition, severe   Oral candida   Anemia   Cellulitis   Hypokalemia   Positive fecal occult blood test   UTI (lower urinary tract infection)   Time coordinating discharge: 35 minutes  Signed:  [name], MD [facility] 11/01/2012, 3:11 PM

## 2012-11-02 LAB — OVA AND PARASITE EXAMINATION

## 2012-11-03 LAB — CULTURE, BLOOD (ROUTINE X 2): Culture: NO GROWTH

## 2012-11-05 LAB — STOOL CULTURE

## 2012-11-08 ENCOUNTER — Other Ambulatory Visit: Payer: Self-pay | Admitting: *Deleted

## 2012-11-08 MED ORDER — HALOPERIDOL 10 MG PO TABS: 5.0000 mg | ORAL_TABLET | ORAL | Status: AC | PRN

## 2012-11-09 ENCOUNTER — Ambulatory Visit: Payer: Medicaid Other | Admitting: Hematology & Oncology

## 2012-11-09 ENCOUNTER — Other Ambulatory Visit: Payer: Medicaid Other | Admitting: Lab

## 2012-11-09 ENCOUNTER — Ambulatory Visit: Payer: Medicaid Other

## 2012-11-15 ENCOUNTER — Ambulatory Visit: Payer: Medicaid Other

## 2012-11-15 ENCOUNTER — Other Ambulatory Visit: Payer: Medicaid Other | Admitting: Lab

## 2012-11-16 ENCOUNTER — Telehealth: Payer: Self-pay | Admitting: Hematology & Oncology

## 2012-11-16 NOTE — Telephone Encounter (Signed)
Per [name] RN patient passed 11/19/12 at 710 am. Tiffany in medical records aware.

## 2012-11-29 ENCOUNTER — Other Ambulatory Visit: Payer: Medicaid Other | Admitting: Lab

## 2012-11-29 ENCOUNTER — Ambulatory Visit: Payer: Medicaid Other

## 2012-12-06 ENCOUNTER — Ambulatory Visit: Payer: Medicaid Other

## 2012-12-06 ENCOUNTER — Other Ambulatory Visit: Payer: Medicaid Other | Admitting: Lab

## 2012-12-06 ENCOUNTER — Ambulatory Visit: Payer: Medicaid Other | Admitting: Hematology & Oncology

## 2012-12-11 NOTE — Progress Notes (Signed)
This encounter was created in error - please disregard.

## 2012-12-13 DEATH — deceased

## 2013-01-01 ENCOUNTER — Telehealth: Payer: Self-pay | Admitting: Hematology & Oncology

## 2013-01-01 NOTE — Telephone Encounter (Signed)
Faxed signed and dated supplemental order to [facility] [place] today to: [phone]   P: [phone]   COPY SCANNED

## 2013-11-06 ENCOUNTER — Other Ambulatory Visit: Payer: Self-pay | Admitting: Pharmacist

## 2014-04-14 IMAGING — CT CT ABD-PEL WO/W CM
2 of 9 series · 11 of 46 positions shown, 18 images · IV contrast (Omnipaque 300)
Comparison: 07/02/2011.

CLINICAL DATA: Gross hematuria.

CT ABDOMEN AND PELVIS WITHOUT AND WITH CONTRAST
TECHNIQUE: Multidetector CT imaging of the abdomen and pelvis was
performed without contrast material in one or both body regions,
followed by contrast material(s) and further sections in one or
both body regions.
Contrast: 125mL OMNIPAQUE IOHEXOL 300 MG/ML  SOLN

[Series 6: hematuria post contrast (id) · axial · 0.87mm/px · z∈[-406,-22]mm · 9 of 97 slices shown, 15 images]
[im 10/97  soft-tissue]
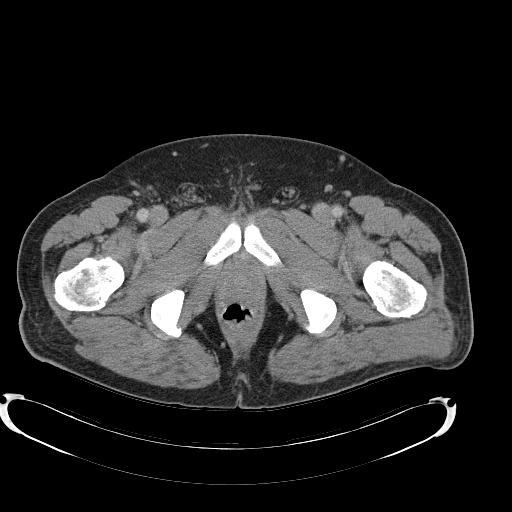
[im 10/97  bone]
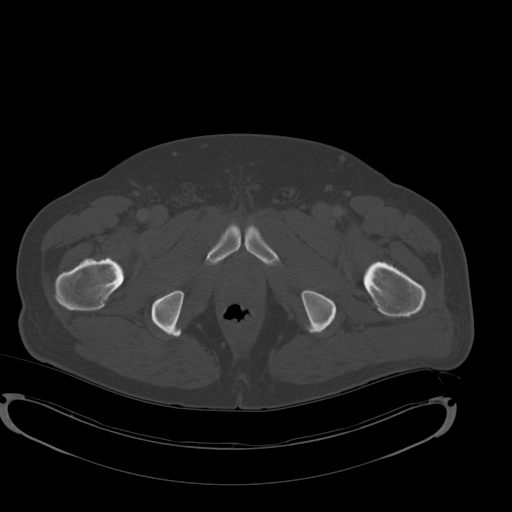
[im 20/97  soft-tissue]
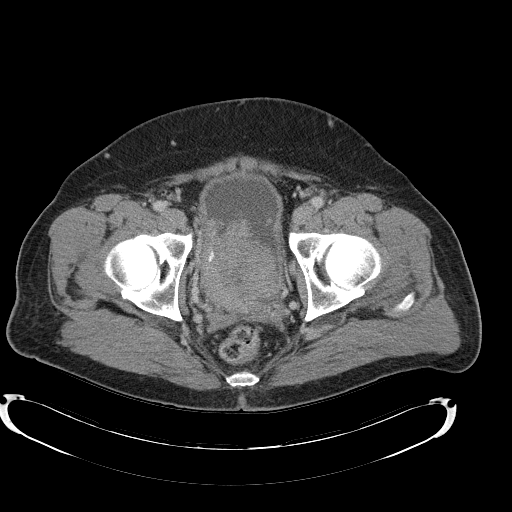
[im 29/97  soft-tissue]
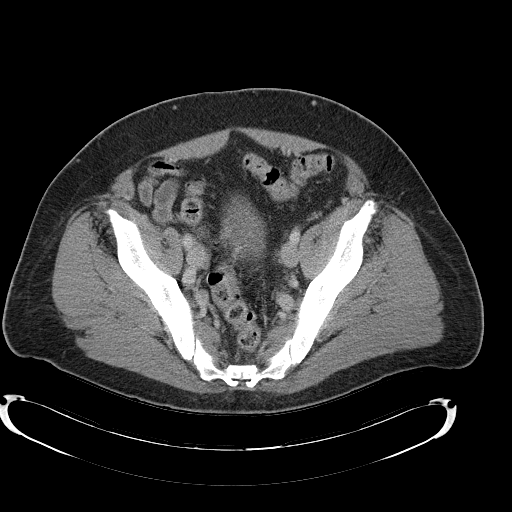
[im 39/97  soft-tissue]
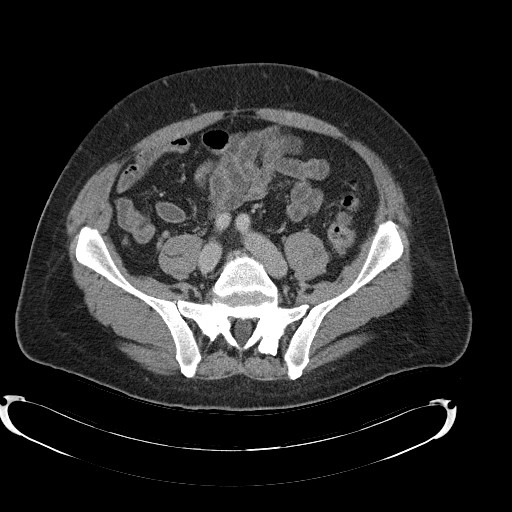
[im 49/97  soft-tissue]
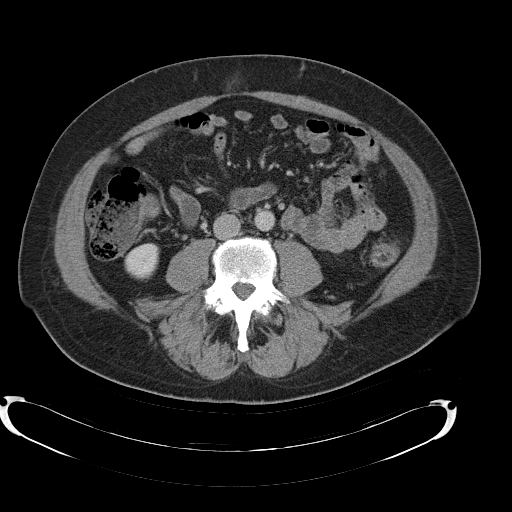
[im 58/97  soft-tissue]
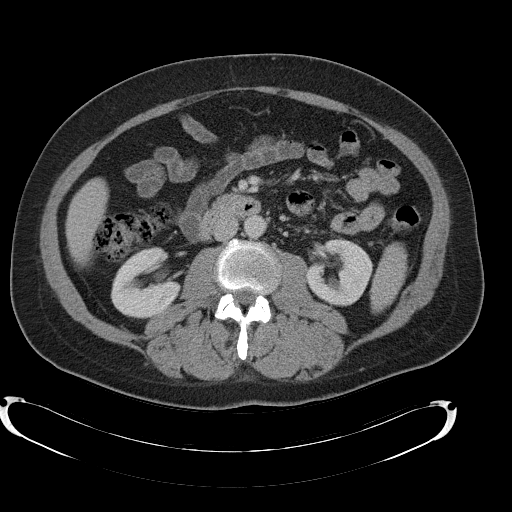
[im 58/97  lung]
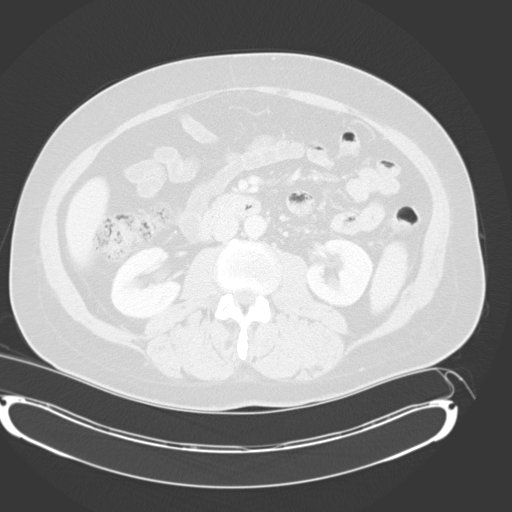
[im 68/97  soft-tissue]
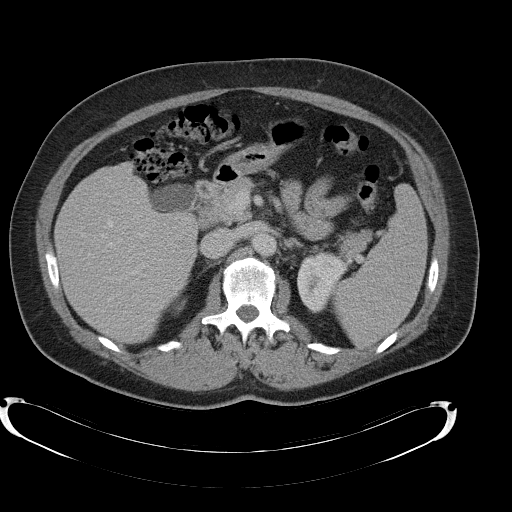
[im 68/97  lung]
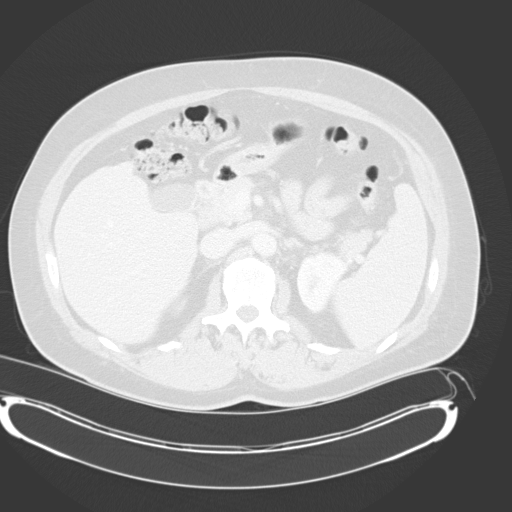
[im 77/97  soft-tissue]
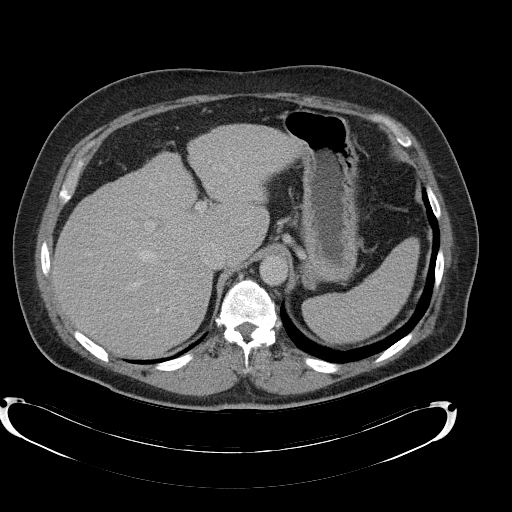
[im 77/97  lung]
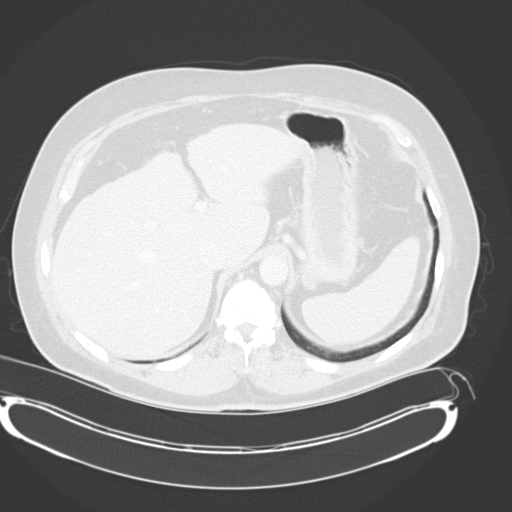
[im 87/97  soft-tissue]
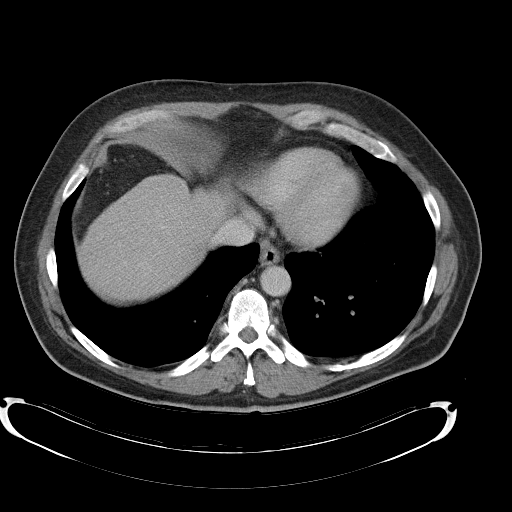
[im 87/97  lung]
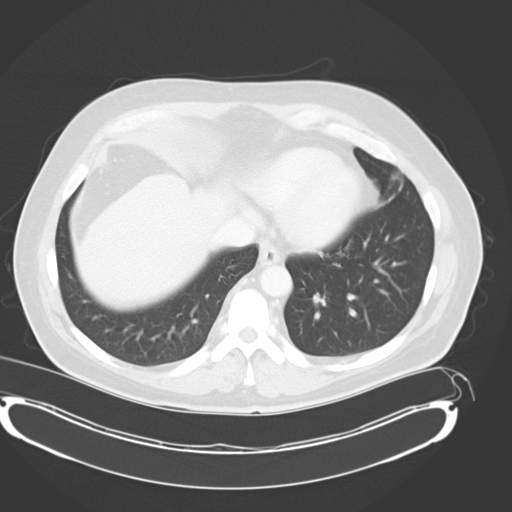
[im 87/97  bone]
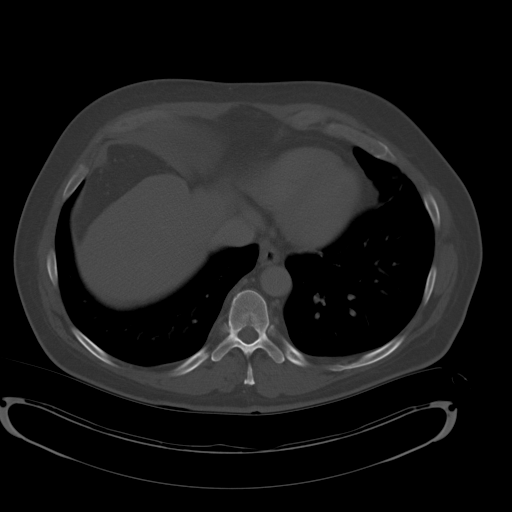

[Series 13: hematuria mpr cor delay (id) · coronal · delayed · 0.85mm/px · 2 of 93 slices shown, 3 images]
[im 31/93  soft-tissue]
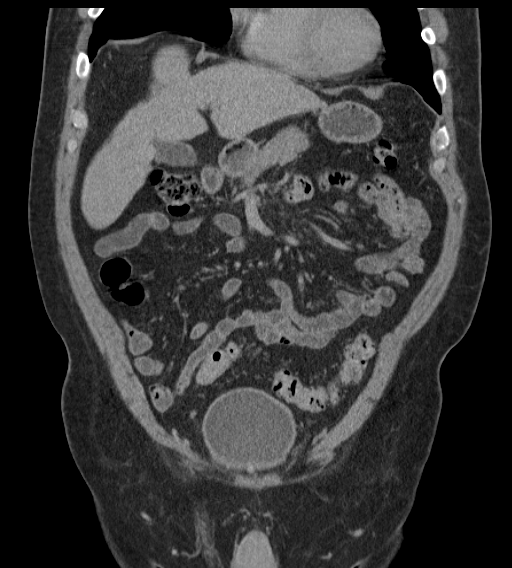
[im 31/93  bone]
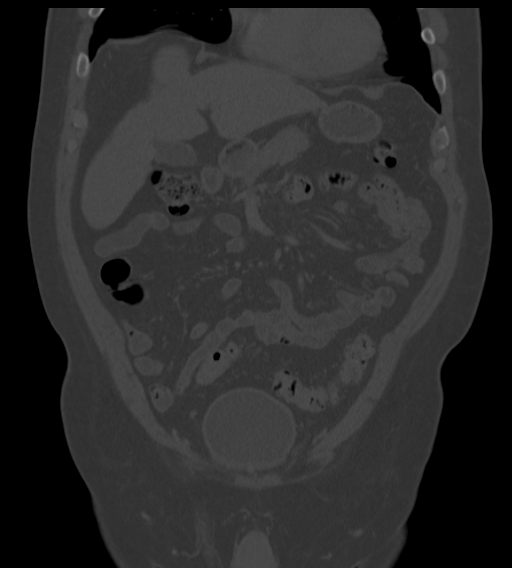
[im 62/93  soft-tissue]
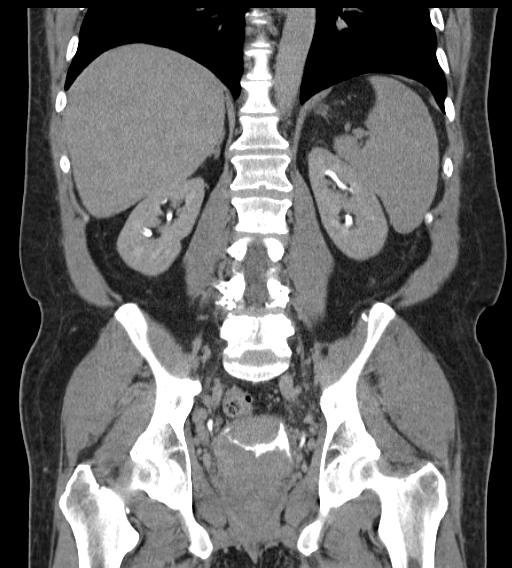

[11 of 46 positions shown; findings below may reference images not displayed]

FINDINGS: Small bilateral lung nodules are suspicious for
metastasis.  No pleural effusion or acute pulmonary findings.

There are cirrhotic changes involving the liver.  No focal hepatic
lesion or intrahepatic biliary dilatation.  The gallbladder is
normal.  No common bile duct dilatation.  The pancreas is normal.
The spleen is enlarged measuring 15 x 12 x 10 cm.  The adrenal
glands and kidneys are normal.  No renal mass lesions, renal
calculi or collecting system abnormalities.  Both ureters are
normal.  There is a large irregular mass involving the entire lower
aspect of the bladder consistent with transitional cell cancer.  No
obvious extension outside the bladder but involvement of the right
side the bladder wall could be significant.  There are small pelvic
lymph nodes but no adenopathy.  The prostate gland and seminal
vesicles are grossly normal.  Mild prostate gland enlargement.  No
inguinal mass or hernia.  Small inguinal lymph nodes are noted.

The stomach, duodenum, small bowel and colon are grossly normal
without oral contrast.  There is a small periumbilical hernia
containing fat.  The aorta is normal in caliber.  The major branch
vessels are normal.  Small scattered mesenteric and retroperitoneal
lymph nodes but no adenopathy or mass.

The bony structures are intact.  No obvious bone lesions.
IMPRESSION: 1.  Large fungating appearing bladder mass consistent with
transitional cell carcinoma.  Extensive involvement of the right
bladder wall is worrisome.  No obvious extension outside the
bladder.  There are a few small scattered pelvic lymph nodes but no
overt adenopathy.
2.  Small pulmonary nodules at the lung bases are worrisome for
metastasis.
3.  Cirrhosis with portal hypertension and splenomegaly.

## 2014-07-05 NOTE — Consult Note (Signed)
Reason for Visit: This 59 year old Male patient presents to the clinic for initial evaluation of  stage IV transitional cell carcinoma .   Referred by Dr. Burney Gauze.  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 He presented to [facility] severe about bone pain in June 3 of 2014. MRI on admission of the lumbar spine showed destruction of L4-L5. He also had adenopathy in the left inguinal region which was biopsied and showed high-grade metastatic carcinoma with squamous differentiation consistent with high-grade urothelial carcinoma. Had a kyphoplasty on June 10 L4-L5. PET/CT scan showed widespread metastatic disease including multiple bony sites periaortic lymph nodes lungs and liver. He is been seen by Dr. Marin Olp and a Port-A-Cath was placed with the intention of delivering systemic chemotherapy at some point. He is now referred to radiation oncologyfor consideration of palliative treatment to the L4-L5 region. Biopsy of L4-L5 showed no evidence of metastatic disease although is quite clear from MRI findings and that this is metastatic involvement. He is seen today he has developed some significant lymphedema in his left lower extremity. Still complaining of significant lower back pain for which she's being treated with narcotic analgesics including fentanylpatch.  Past Hx:    back  pain:    denies:    bladder removed:   Past, Family and Social History:  Past Medical History positive   Neurological/Psychiatric anxiety   Past Surgical History radical cystectomy and prostatectomy with ileostomy diversion   Family History noncontributory   Social History positive   Social History Comments 50-pack-year smoking history   Additional Past Medical and Surgical History accompanied by wife today   Allergies:   No Known Allergies:   Home Meds:  Home Medications: Medication Instructions Status  fentaNYL 25 mcg/hr transdermal film, extended release 1 patch transdermal every 72 hours Active  Dilaudid 4 mg oral tablet 1 tab(s) orally every 4 hours Active  clonazePAM 0.5 mg oral tablet 1 tab(s) orally 3 times a day Active  mirtazapine 15 mg oral tablet 1 tab(s) orally once a day (at bedtime) Active  Men's Senior Multiple Vitamin 1 tab(s) orally once a day Active   Review of Systems:  General negative   Performance Status (ECOG) 1   Skin negative   Breast negative   Ophthalmologic negative   ENMT negative   Respiratory and Thorax negative   Cardiovascular negative   Gastrointestinal negative   Genitourinary negative   Musculoskeletal negative   Neurological negative   Psychiatric negative   Hematology/Lymphatics negative   Endocrine negative   Allergic/Immunologic negative   Review of Systems   according to the nurse's notesexcept for back pain and some difficulty ambulatingPatient denies any weight loss, fatigue, weakness, fever, chills or night sweats. Patient denies any loss of vision, blurred vision. Patient denies any ringing  of the ears or hearing loss. No irregular heartbeat. Patient denies heart murmur or history of fainting. Patient denies any chest pain or pain radiating to  her upper extremities. Patient denies any shortness of breath, difficulty breathing at night, cough or hemoptysis. Patient denies any swelling in the lower legs.  Patient denies any nausea vomiting, vomiting of blood, or coffee ground material in the vomitus. Patient denies any stomach pain. Patient states has had normal bowel movements no significant constipation or diarrhea. Patient denies any dysuria, hematuria or significant nocturia. Patient denies any problems walking, swelling in the joints or loss of balance. Patient denies any skin changes, loss of hair or loss of weight. Patient denies any excessive worrying or anxiety or significant depression. Patient denies any problems with insomnia. Patient denies excessive thirst, polyuria, polydipsia. Patient denies any swollen glands, patient denies easy bruising or easy bleeding. Patient denies any recent infections, allergies or URI. Patient "s visual fields have not changed significantly in recent time.  Nursing Notes:  Nursing Vital Signs and Chemo Nursing Nursing Notes: *CC Vital Signs Flowsheet:   26-Jun-14 09:49  Temp Temperature 99.3  Pulse Pulse 102  Respirations Respirations 20  SBP SBP 126  DBP DBP 91  Current Weight (kg) (kg) 90.1   Physical Exam:  General/Skin/HEENT:  Skin normal   Eyes normal   ENMT normal   Head and Neck normal   Additional PE well-developed male in moderate pain discomfort. Lungs are clear to A&P cardiac examination shows regular rate and rhythm. He has an diverting ileostomy secondary to bladder surgery. That is functioning well. He has significant lymphedema of his left lower extremity. Motor and sensory levels are equal and symmetric in the lower extremities. Proprioception is intact.   Breasts/Resp/CV/GI/GU:  Respiratory and Thorax normal   Cardiovascular normal   Gastrointestinal normal   Genitourinary normal   MS/Neuro/Psych/Lymph:  Musculoskeletal normal   Neurological normal   Lymphatics normal   Other Results:  Radiology Results: LabUnknown:    24-Jun-14 09:57, PET/CT Staging Bladder  PACS Image   Nuclear Med:  PET/CT Staging Bladder    REASON FOR EXAM:    Staging bladder Ca  COMMENTS:       PROCEDURE: PET - PET/CT STAGING BLADDER  - Sep 05 2012  9:57AM     RESULT: The patient is undergoing staging of bladder malignancy. The   patient's fasting blood glucose level was 92 mg per deciliter. The   patient received 11.95 mCi of F-18 labeled FDG at 8:14 a.m. with scanning   beginning at 9:25 a.m.    Uptake of the radiopharmaceutical within the neck is normal. There are   foci of increased uptake in the medial left supraclavicular region which   correspond to normal size lymph nodes. The maximal SUV is 5.2 with a mean   of 2.8. More laterally on the left increased uptake within a   supraclavicular node exhibits maximal SUV by 7.3 with a mean of 4.4.     Normal sized but hypermetabolic lymph nodes adjacent to the medial aspect   of the left subclavian vein posterior to the left thyroid lobe exhibit   maximal SUV of 7.7 with a mean of 4.4. On the right at approximate the   same level there is a normal size lymph node exhibiting maximal uptake of   5 with a mean of 2.7. There are additional similar sized hypermetabolic   lymph nodes in the supraclavicular regions and infraclavicular regions   bilaterally.    Within the mediastinum there are multiple hypermetabolic nodes in the   pretracheal and precarinal region as well as in the AP window region. The  largest and most hypermetabolic group lies anterior and to the left of   the proximal left mainstem bronchus. This exhibits maximal SUV of 10.6   with a mean of 6.1.     In the right upper lobe inferiorly there is a hypermetabolic nodule     measuring  1 to 1.5 cm in greatest dimension with maximal SUV of 5.2 with   a mean of 3.2. On the left in the infrahilar region posteriorly a   hypermetabolic focus exhibits maximal SUV of 4.9 with a mean of 3. In the   superior segment of the left lower lobe posteriorly 1 cm diameter area of   nodularity exhibits maximal SUV of 2.5  with a mean of 1.4. A focus in the   inferior aspect of the left upper lobe exhibits maximal SUVof 2.7 with a   mean of 1.5. Adjacent to the inferior aspect of the esophagus in the   midline there is a 1 cm diameter nodule which exhibits maximal SUV of 6.7   with a mean of 4.    Within the abdomen the liver exhibits multiple hypermetabolic foci. One   lies adjacent to the gallbladder fossa and exhibits maximal SUV of 6.8   with a mean of 3.7. A second larger hypodensity in the inferior aspect of   the right lobe corresponds to an area of decreased density on the CT   images and has maximal SUV of 5. Six with a mean of 3.1. There is a large     amount of activity associated with the patient's ileostomy an ileal   conduit. This would be expected given the renal excretion of the   radiopharmaceutical. There is considerable uptake within the kidneys.   There is a large area of abnormal uptake within enlarged retroperitoneal   lymph nodes at approximate the level of the kidneys with maximal SUV of   9.8 with a mean of 5.0. There is a large poorly defined nodal mass in the   left inguinalregion which extends into the deeper pelvic tissues. The   maximal SUV is 11.0 with a mean of 6.2. There is abnormal uptake   associated with the distal aspect of the rectosigmoid anteriorly and to   the right which is worrisome for involved lymph nodes. The maximal SUV   here is 11.9 with a mean of 7.3. There is a hypermetabolic lymph node   adjacent to the right aspect of the knee urethra with maximal SUV of 6.9   with a mean of 4.1.    IMPRESSION:   1. There is widespread intrathoracic metastatic disease. This involves   the supra andinfraclavicular regions, the mediastinum and hilar regions,   and the pulmonary parenchyma.  2. Abnormal uptake consistent with metastatic disease is demonstrated   within nodal groups within the peritoneal cavity and in the   retroperitoneum as described. There is also  increased uptake in the left   inguinal region and associated with the anterior aspect of the   rectosigmoid peripherally.  3. Abnormal uptake within the liver is also consistent with metastatic   disease.     Dictation Site: 1        Verified By: DAVID A. Martinique, M.D., MD   Relevent Results:   Relevant Scans and Labs PET CT scan and MRI scans are reviewed   Assessment and Plan: Impression:   widespread metastatic urothelial carcinoma with significant destruction at L4-L5 and involvement of the SI joints byMRI and PET  criteria. Plan:   the stomach to go ahead with a palliative course of radiation therapy including L4-L5 and the SI joints. There is some PET positivity in this region which I can also address with one course of treatment. Would plan on delivering 3000 cGy in 10 fractions. Risks and benefits of treatment including possibility of diarrhea, alteration of blood counts, fatigue, and skin reaction were all explained in detail to the patient. I have set him up for CT simulation early next week.  I would like to take this opportunity to thank you for allowing me to continue to participate in this patient's care.  CC Referral:  cc: [facility]   Electronic Signatures: Armstead Peaks (MD)  (Signed 26-Jun-14 11:47)  Authored: HPI, Diagnosis, Past Hx, PFSH, Allergies, Home Meds, ROS, Nursing Notes, Physical Exam, Other Results, Relevent Results, Encounter Assessment and Plan, CC Referring Physician   Last Updated: 26-Jun-14 11:47 by Armstead Peaks (MD)
